# Patient Record
Sex: Female | Born: 1940 | Race: Black or African American | Hispanic: No | Marital: Single | Smoking: Former smoker
Health system: Southern US, Community
[De-identification: ages and names within clinical notes are randomized; demographics above are authoritative.]

## PROBLEM LIST (undated history)

## (undated) DIAGNOSIS — I10 Essential (primary) hypertension: Secondary | ICD-10-CM

## (undated) DIAGNOSIS — M1990 Unspecified osteoarthritis, unspecified site: Secondary | ICD-10-CM

## (undated) DIAGNOSIS — E119 Type 2 diabetes mellitus without complications: Secondary | ICD-10-CM

## (undated) DIAGNOSIS — I639 Cerebral infarction, unspecified: Secondary | ICD-10-CM

## (undated) DIAGNOSIS — F0390 Unspecified dementia without behavioral disturbance: Secondary | ICD-10-CM

## (undated) HISTORY — PX: ABDOMINAL SURGERY: SHX537

## (undated) HISTORY — PX: ABDOMINAL HYSTERECTOMY: SHX81

## (undated) HISTORY — DX: Unspecified osteoarthritis, unspecified site: M19.90

---

## 2003-12-24 ENCOUNTER — Other Ambulatory Visit: Payer: Self-pay

## 2004-01-07 ENCOUNTER — Other Ambulatory Visit: Payer: Self-pay

## 2004-01-08 ENCOUNTER — Observation Stay: Payer: Self-pay

## 2004-04-01 ENCOUNTER — Other Ambulatory Visit: Payer: Self-pay

## 2004-04-01 ENCOUNTER — Emergency Department: Payer: Self-pay | Admitting: General Practice

## 2004-06-20 ENCOUNTER — Emergency Department: Payer: Self-pay | Admitting: Unknown Physician Specialty

## 2005-08-25 ENCOUNTER — Emergency Department: Payer: Self-pay | Admitting: Emergency Medicine

## 2006-03-22 ENCOUNTER — Other Ambulatory Visit: Payer: Self-pay

## 2006-03-22 ENCOUNTER — Inpatient Hospital Stay: Payer: Self-pay | Admitting: Internal Medicine

## 2006-04-25 ENCOUNTER — Emergency Department: Payer: Self-pay | Admitting: Unknown Physician Specialty

## 2006-06-22 ENCOUNTER — Ambulatory Visit: Payer: Self-pay | Admitting: Otolaryngology

## 2006-06-30 ENCOUNTER — Observation Stay: Payer: Self-pay | Admitting: Otolaryngology

## 2006-10-06 ENCOUNTER — Emergency Department: Payer: Self-pay | Admitting: Emergency Medicine

## 2006-11-09 ENCOUNTER — Emergency Department: Payer: Self-pay | Admitting: Emergency Medicine

## 2006-11-10 ENCOUNTER — Ambulatory Visit: Payer: Self-pay | Admitting: Otolaryngology

## 2007-02-27 ENCOUNTER — Emergency Department: Payer: Self-pay | Admitting: Unknown Physician Specialty

## 2007-04-12 ENCOUNTER — Emergency Department: Payer: Self-pay | Admitting: Emergency Medicine

## 2007-06-30 ENCOUNTER — Ambulatory Visit: Payer: Self-pay | Admitting: Gastroenterology

## 2007-07-15 ENCOUNTER — Ambulatory Visit: Payer: Self-pay | Admitting: Family Medicine

## 2007-08-06 ENCOUNTER — Ambulatory Visit: Payer: Self-pay | Admitting: Family Medicine

## 2008-01-21 ENCOUNTER — Emergency Department: Payer: Self-pay | Admitting: Emergency Medicine

## 2008-02-09 ENCOUNTER — Inpatient Hospital Stay: Payer: Self-pay | Admitting: Internal Medicine

## 2008-04-08 ENCOUNTER — Emergency Department: Payer: Self-pay | Admitting: Emergency Medicine

## 2008-05-04 ENCOUNTER — Emergency Department: Payer: Self-pay

## 2008-05-17 ENCOUNTER — Inpatient Hospital Stay: Payer: Self-pay | Admitting: Internal Medicine

## 2008-07-16 ENCOUNTER — Emergency Department: Payer: Self-pay | Admitting: Emergency Medicine

## 2008-10-05 ENCOUNTER — Emergency Department: Payer: Self-pay | Admitting: Emergency Medicine

## 2008-11-09 ENCOUNTER — Ambulatory Visit: Payer: Self-pay | Admitting: Family Medicine

## 2009-02-28 ENCOUNTER — Ambulatory Visit: Payer: Self-pay | Admitting: Family Medicine

## 2009-05-21 ENCOUNTER — Ambulatory Visit: Payer: Self-pay | Admitting: Gastroenterology

## 2009-06-14 ENCOUNTER — Ambulatory Visit: Payer: Self-pay | Admitting: Gastroenterology

## 2009-06-26 ENCOUNTER — Ambulatory Visit: Payer: Self-pay | Admitting: Gastroenterology

## 2009-07-16 ENCOUNTER — Ambulatory Visit: Payer: Self-pay | Admitting: Surgery

## 2009-07-18 ENCOUNTER — Ambulatory Visit: Payer: Self-pay | Admitting: Cardiology

## 2009-07-19 ENCOUNTER — Ambulatory Visit: Payer: Self-pay | Admitting: Surgery

## 2009-11-01 ENCOUNTER — Ambulatory Visit: Payer: Self-pay | Admitting: Internal Medicine

## 2010-06-24 ENCOUNTER — Emergency Department (HOSPITAL_COMMUNITY)
Admission: EM | Admit: 2010-06-24 | Discharge: 2010-06-24 | Disposition: A | Discharge status: Home / Self Care | Payer: Medicare Other | Attending: Emergency Medicine | Admitting: Emergency Medicine

## 2010-06-24 DIAGNOSIS — K648 Other hemorrhoids: Secondary | ICD-10-CM | POA: Insufficient documentation

## 2010-06-24 DIAGNOSIS — E119 Type 2 diabetes mellitus without complications: Secondary | ICD-10-CM | POA: Insufficient documentation

## 2010-06-24 DIAGNOSIS — I10 Essential (primary) hypertension: Secondary | ICD-10-CM | POA: Insufficient documentation

## 2010-06-24 DIAGNOSIS — B373 Candidiasis of vulva and vagina: Secondary | ICD-10-CM | POA: Insufficient documentation

## 2010-06-24 DIAGNOSIS — L293 Anogenital pruritus, unspecified: Secondary | ICD-10-CM | POA: Insufficient documentation

## 2010-06-24 DIAGNOSIS — B3731 Acute candidiasis of vulva and vagina: Secondary | ICD-10-CM | POA: Insufficient documentation

## 2010-06-24 DIAGNOSIS — R195 Other fecal abnormalities: Secondary | ICD-10-CM | POA: Insufficient documentation

## 2010-06-24 DIAGNOSIS — K6289 Other specified diseases of anus and rectum: Secondary | ICD-10-CM | POA: Insufficient documentation

## 2010-06-24 DIAGNOSIS — E669 Obesity, unspecified: Secondary | ICD-10-CM | POA: Insufficient documentation

## 2010-06-24 LAB — CBC
MCH: 29.8 pg (ref 26.0–34.0)
MCHC: 35.4 g/dL (ref 30.0–36.0)
MCV: 84.2 fL (ref 78.0–100.0)
Platelets: 204 10*3/uL (ref 150–400)
RDW: 13.8 % (ref 11.5–15.5)

## 2010-06-24 LAB — URINALYSIS, ROUTINE W REFLEX MICROSCOPIC
Bilirubin Urine: NEGATIVE
Ketones, ur: 15 mg/dL — AB
Nitrite: NEGATIVE
Protein, ur: NEGATIVE mg/dL
Urobilinogen, UA: 1 mg/dL (ref 0.0–1.0)
pH: 7 (ref 5.0–8.0)

## 2010-06-24 LAB — URINE MICROSCOPIC-ADD ON

## 2010-06-24 LAB — DIFFERENTIAL
Eosinophils Absolute: 0.1 10*3/uL (ref 0.0–0.7)
Eosinophils Relative: 1 % (ref 0–5)
Lymphs Abs: 2.7 10*3/uL (ref 0.7–4.0)
Monocytes Absolute: 0.4 10*3/uL (ref 0.1–1.0)
Monocytes Relative: 6 % (ref 3–12)

## 2010-06-24 LAB — COMPREHENSIVE METABOLIC PANEL
ALT: 18 U/L (ref 0–35)
Calcium: 9 mg/dL (ref 8.4–10.5)
Glucose, Bld: 259 mg/dL — ABNORMAL HIGH (ref 70–99)
Sodium: 136 mEq/L (ref 135–145)
Total Protein: 7.5 g/dL (ref 6.0–8.3)

## 2010-06-24 LAB — WET PREP, GENITAL
Clue Cells Wet Prep HPF POC: NONE SEEN
Trich, Wet Prep: NONE SEEN

## 2010-06-25 LAB — GC/CHLAMYDIA PROBE AMP, GENITAL
Chlamydia, DNA Probe: NEGATIVE
GC Probe Amp, Genital: NEGATIVE

## 2010-07-11 ENCOUNTER — Ambulatory Visit: Payer: PRIVATE HEALTH INSURANCE | Admitting: Internal Medicine

## 2010-07-15 ENCOUNTER — Emergency Department (HOSPITAL_COMMUNITY): Payer: Medicare Other

## 2010-07-15 ENCOUNTER — Emergency Department (HOSPITAL_COMMUNITY)
Admission: EM | Admit: 2010-07-15 | Discharge: 2010-07-15 | Disposition: A | Discharge status: Home / Self Care | Payer: Medicare Other | Attending: Emergency Medicine | Admitting: Emergency Medicine

## 2010-07-15 DIAGNOSIS — R300 Dysuria: Secondary | ICD-10-CM | POA: Insufficient documentation

## 2010-07-15 DIAGNOSIS — R350 Frequency of micturition: Secondary | ICD-10-CM | POA: Insufficient documentation

## 2010-07-15 DIAGNOSIS — R141 Gas pain: Secondary | ICD-10-CM | POA: Insufficient documentation

## 2010-07-15 DIAGNOSIS — R319 Hematuria, unspecified: Secondary | ICD-10-CM | POA: Insufficient documentation

## 2010-07-15 DIAGNOSIS — R109 Unspecified abdominal pain: Secondary | ICD-10-CM | POA: Insufficient documentation

## 2010-07-15 DIAGNOSIS — R142 Eructation: Secondary | ICD-10-CM | POA: Insufficient documentation

## 2010-07-15 DIAGNOSIS — E119 Type 2 diabetes mellitus without complications: Secondary | ICD-10-CM | POA: Insufficient documentation

## 2010-07-15 DIAGNOSIS — N390 Urinary tract infection, site not specified: Secondary | ICD-10-CM | POA: Insufficient documentation

## 2010-07-15 DIAGNOSIS — I10 Essential (primary) hypertension: Secondary | ICD-10-CM | POA: Insufficient documentation

## 2010-07-15 LAB — DIFFERENTIAL
Basophils Absolute: 0 10*3/uL (ref 0.0–0.1)
Lymphocytes Relative: 26 % (ref 12–46)
Monocytes Absolute: 0.6 10*3/uL (ref 0.1–1.0)
Neutro Abs: 7.2 10*3/uL (ref 1.7–7.7)

## 2010-07-15 LAB — COMPREHENSIVE METABOLIC PANEL
ALT: 18 U/L (ref 0–35)
Alkaline Phosphatase: 82 U/L (ref 39–117)
CO2: 22 mEq/L (ref 19–32)
GFR calc non Af Amer: >60 mL/min (ref >60)
Glucose, Bld: 270 mg/dL — ABNORMAL HIGH (ref 70–99)
Potassium: 3.8 mEq/L (ref 3.5–5.1)
Sodium: 134 mEq/L — ABNORMAL LOW (ref 135–145)

## 2010-07-15 LAB — URINALYSIS, ROUTINE W REFLEX MICROSCOPIC
Bilirubin Urine: NEGATIVE
Glucose, UA: >1000 mg/dL — AB
Ketones, ur: 40 mg/dL — AB
Specific Gravity, Urine: 1.023 (ref 1.005–1.030)
pH: 7 (ref 5.0–8.0)

## 2010-07-15 LAB — CBC
HCT: 44.5 % (ref 36.0–46.0)
Hemoglobin: 15.8 g/dL — ABNORMAL HIGH (ref 12.0–15.0)
RBC: 5.22 MIL/uL — ABNORMAL HIGH (ref 3.87–5.11)
WBC: 10.8 10*3/uL — ABNORMAL HIGH (ref 4.0–10.5)

## 2010-07-15 LAB — URINE MICROSCOPIC-ADD ON

## 2010-07-15 LAB — WET PREP, GENITAL: Yeast Wet Prep HPF POC: NONE SEEN

## 2010-07-15 MED ORDER — IOHEXOL 300 MG/ML  SOLN
100.0000 mL | Freq: Once | INTRAMUSCULAR | Status: AC | PRN
  Administered 2010-07-15: 100 mL via INTRAVENOUS

## 2010-08-05 ENCOUNTER — Ambulatory Visit: Payer: PRIVATE HEALTH INSURANCE | Admitting: Internal Medicine

## 2010-09-21 ENCOUNTER — Emergency Department: Payer: Self-pay | Admitting: Emergency Medicine

## 2011-06-09 DIAGNOSIS — H814 Vertigo of central origin: Secondary | ICD-10-CM | POA: Diagnosis not present

## 2011-06-09 DIAGNOSIS — H60339 Swimmer's ear, unspecified ear: Secondary | ICD-10-CM | POA: Diagnosis not present

## 2011-09-05 DIAGNOSIS — E559 Vitamin D deficiency, unspecified: Secondary | ICD-10-CM | POA: Diagnosis not present

## 2011-09-05 DIAGNOSIS — I10 Essential (primary) hypertension: Secondary | ICD-10-CM | POA: Diagnosis not present

## 2011-09-05 DIAGNOSIS — E119 Type 2 diabetes mellitus without complications: Secondary | ICD-10-CM | POA: Diagnosis not present

## 2012-01-19 DIAGNOSIS — I10 Essential (primary) hypertension: Secondary | ICD-10-CM | POA: Diagnosis not present

## 2012-01-19 DIAGNOSIS — E782 Mixed hyperlipidemia: Secondary | ICD-10-CM | POA: Diagnosis not present

## 2012-01-19 DIAGNOSIS — E119 Type 2 diabetes mellitus without complications: Secondary | ICD-10-CM | POA: Diagnosis not present

## 2012-09-05 ENCOUNTER — Encounter (HOSPITAL_COMMUNITY): Payer: Self-pay | Admitting: Emergency Medicine

## 2012-09-05 ENCOUNTER — Emergency Department (HOSPITAL_COMMUNITY)
Admission: EM | Admit: 2012-09-05 | Discharge: 2012-09-05 | Disposition: A | Discharge status: Home / Self Care | Payer: Medicare Other | Attending: Emergency Medicine | Admitting: Emergency Medicine

## 2012-09-05 DIAGNOSIS — Z794 Long term (current) use of insulin: Secondary | ICD-10-CM | POA: Insufficient documentation

## 2012-09-05 DIAGNOSIS — E119 Type 2 diabetes mellitus without complications: Secondary | ICD-10-CM | POA: Insufficient documentation

## 2012-09-05 DIAGNOSIS — K921 Melena: Secondary | ICD-10-CM | POA: Insufficient documentation

## 2012-09-05 DIAGNOSIS — K625 Hemorrhage of anus and rectum: Secondary | ICD-10-CM

## 2012-09-05 DIAGNOSIS — Z79899 Other long term (current) drug therapy: Secondary | ICD-10-CM | POA: Insufficient documentation

## 2012-09-05 DIAGNOSIS — R110 Nausea: Secondary | ICD-10-CM | POA: Insufficient documentation

## 2012-09-05 DIAGNOSIS — I10 Essential (primary) hypertension: Secondary | ICD-10-CM | POA: Insufficient documentation

## 2012-09-05 HISTORY — DX: Type 2 diabetes mellitus without complications: E11.9

## 2012-09-05 HISTORY — DX: Essential (primary) hypertension: I10

## 2012-09-05 LAB — PROTIME-INR: INR: 0.96 (ref 0.00–1.49)

## 2012-09-05 LAB — BASIC METABOLIC PANEL
CO2: 29 mEq/L (ref 19–32)
Calcium: 9.2 mg/dL (ref 8.4–10.5)
Creatinine, Ser: 0.64 mg/dL (ref 0.50–1.10)
GFR calc Af Amer: >90 mL/min (ref >90)
GFR calc non Af Amer: 87 mL/min — ABNORMAL LOW (ref >90)
Sodium: 141 mEq/L (ref 135–145)

## 2012-09-05 LAB — TYPE AND SCREEN
ABO/RH(D): A NEG
Antibody Screen: NEGATIVE

## 2012-09-05 LAB — CBC
MCH: 30.2 pg (ref 26.0–34.0)
Platelets: 180 10*3/uL (ref 150–400)
RBC: 4.61 MIL/uL (ref 3.87–5.11)
RDW: 14.1 % (ref 11.5–15.5)

## 2012-09-05 MED ORDER — HYDROCODONE-ACETAMINOPHEN 5-325 MG PO TABS
1.0000 | ORAL_TABLET | ORAL | Status: DC | PRN
Start: 2012-09-05 — End: 2012-10-25

## 2012-09-05 MED ORDER — ONDANSETRON 8 MG PO TBDP: 8.0000 mg | ORAL_TABLET | Freq: Three times a day (TID) | ORAL | Status: DC | PRN

## 2012-09-05 MED ORDER — POTASSIUM CHLORIDE CRYS ER 20 MEQ PO TBCR
40.0000 meq | EXTENDED_RELEASE_TABLET | Freq: Once | ORAL | Status: AC
  Administered 2012-09-05: 40 meq via ORAL
  Filled 2012-09-05: qty 2

## 2012-09-05 NOTE — ED Provider Notes (Signed)
History     CSN: [id number]  Arrival date & time 09/05/12  0911   First MD Initiated Contact with Patient 09/05/12 [phone]      Chief Complaint  Patient presents with  . Rectal Bleeding     The history is provided by the patient.   patient reports a history of intermittent abdominal pain.  She reports she has a history of irritable bowel syndrome.  She reports her last 5 days she's had intermittent crampy upper abdominal pain with associated nausea.  She denies vomiting.  She denies diarrhea or constipation.  She reports she's feels like she seen some blood mixed in with her stool.  She's only had 1 bowel movement a day.  She denies rectal bleeding between bowel movements.  Her symptoms are mild to moderate in severity.  Nothing worsens or improves her symptoms.  She has been able to keep herself somewhat hydrated at home.  No fevers or chills.  She states regarding her intermittent abdominal pain her gastroenterologist believes it's irritable bowel syndrome but has no other diagnosis.  Past Medical History  Diagnosis Date  . Hypertension   . Diabetes mellitus without complication     History reviewed. No pertinent past surgical history.  No family history on file.  History  Substance Use Topics  . Smoking status: Not on file  . Smokeless tobacco: Not on file  . Alcohol Use: No    OB History   Grav Para Term Preterm Abortions TAB SAB Ect Mult Living                  Review of Systems  Gastrointestinal: Positive for hematochezia.  All other systems reviewed and are negative.    Allergies  Aspirin and Motrin  Home Medications   Current Outpatient Rx  Name  Route  Sig  Dispense  Refill  . atenolol (TENORMIN) 100 MG tablet   Oral   Take 100 mg by mouth daily.         . diphenhydramine-acetaminophen (TYLENOL PM) 25-500 MG TABS   Oral   Take 2 tablets by mouth at bedtime as needed (sleep/pain).         . insulin glargine (LANTUS) 100 UNIT/ML injection  Subcutaneous   Inject 30 Units into the skin daily.           BP 171/87  Pulse 79  Temp(Src) 98.5 F (36.9 C) (Oral)  Resp 18  SpO2 99%  Physical Exam  Nursing note and vitals reviewed. Constitutional: She is oriented to person, place, and time. She appears well-developed and well-nourished. No distress.  HENT:  Head: Normocephalic and atraumatic.  Eyes: EOM are normal.  Neck: Normal range of motion.  Cardiovascular: Normal rate, regular rhythm and normal heart sounds.   Pulmonary/Chest: Effort normal and breath sounds normal.  Abdominal: Soft. She exhibits no distension. There is no tenderness.  Genitourinary:  No bleeding external hemorrhoids noted.  No obvious external hemorrhoids.  No tenderness on rectal exam.  Stool is brown.  No gross blood.  Musculoskeletal: Normal range of motion.  Neurological: She is alert and oriented to person, place, and time.  Skin: Skin is warm and dry.  Psychiatric: She has a normal mood and affect. Judgment normal.    ED Course  Procedures (including critical care time)  Labs Reviewed  BASIC METABOLIC PANEL - Abnormal; Notable for the following:    Potassium 2.7 (*)    Glucose, Bld 174 (*)    GFR calc non  Af Amer 87 (*)    All other components within normal limits  CBC  PROTIME-INR  TYPE AND SCREEN  ABO/RH   No results found.   1. Nausea   2. Rectal bleeding       MDM  Discharge the patient feels much better.  Hemoccult blood test is negative at the bedside.  Vitals and hemoglobin normal.  GI followup.  Repeat abdominal exam benign.  Potassium repleted in emergency apartment.  Discharge home in good condition         Lyanne Co, MD 09/05/12 [phone]

## 2012-09-05 NOTE — ED Notes (Signed)
Pt. Stated, I 've been seeing blood in my stool for about a week, I've also had some abd. Cramp.

## 2012-09-05 NOTE — ED Notes (Signed)
Dr. Campos at the bedside.  

## 2012-09-07 ENCOUNTER — Telehealth (HOSPITAL_COMMUNITY): Payer: Self-pay | Admitting: *Deleted

## 2012-10-25 ENCOUNTER — Emergency Department (HOSPITAL_COMMUNITY)
Admission: EM | Admit: 2012-10-25 | Discharge: 2012-10-25 | Disposition: A | Discharge status: Home / Self Care | Payer: Medicare Other | Attending: Emergency Medicine | Admitting: Emergency Medicine

## 2012-10-25 ENCOUNTER — Encounter (HOSPITAL_COMMUNITY): Payer: Self-pay | Admitting: *Deleted

## 2012-10-25 DIAGNOSIS — H6001 Abscess of right external ear: Secondary | ICD-10-CM

## 2012-10-25 DIAGNOSIS — Z794 Long term (current) use of insulin: Secondary | ICD-10-CM | POA: Insufficient documentation

## 2012-10-25 DIAGNOSIS — Z888 Allergy status to other drugs, medicaments and biological substances status: Secondary | ICD-10-CM | POA: Insufficient documentation

## 2012-10-25 DIAGNOSIS — Z880 Allergy status to penicillin: Secondary | ICD-10-CM | POA: Insufficient documentation

## 2012-10-25 DIAGNOSIS — E1159 Type 2 diabetes mellitus with other circulatory complications: Secondary | ICD-10-CM | POA: Insufficient documentation

## 2012-10-25 DIAGNOSIS — H60399 Other infective otitis externa, unspecified ear: Secondary | ICD-10-CM | POA: Insufficient documentation

## 2012-10-25 DIAGNOSIS — Z79899 Other long term (current) drug therapy: Secondary | ICD-10-CM | POA: Insufficient documentation

## 2012-10-25 DIAGNOSIS — I10 Essential (primary) hypertension: Secondary | ICD-10-CM | POA: Insufficient documentation

## 2012-10-25 MED ORDER — ACETAMINOPHEN-CODEINE #3 300-30 MG PO TABS: 1.0000 | ORAL_TABLET | Freq: Four times a day (QID) | ORAL | Status: DC | PRN

## 2012-10-25 MED ORDER — ACETAMINOPHEN-CODEINE #4 300-60 MG PO TABS: 1.0000 | ORAL_TABLET | ORAL | Status: DC | PRN

## 2012-10-25 MED ORDER — ACETAMINOPHEN-CODEINE #3 300-30 MG PO TABS
2.0000 | ORAL_TABLET | Freq: Once | ORAL | Status: AC
  Administered 2012-10-25: 2 via ORAL
  Filled 2012-10-25: qty 2

## 2012-10-25 MED ORDER — SULFAMETHOXAZOLE-TRIMETHOPRIM 800-160 MG PO TABS: 2.0000 | ORAL_TABLET | Freq: Two times a day (BID) | ORAL | Status: DC

## 2012-10-25 NOTE — ED Provider Notes (Signed)
Medical screening examination/treatment/procedure(s) were conducted as a shared visit with non-physician practitioner(s) and myself.  I personally evaluated the patient during the encounter.  The patient has a history of DM and presents with a painful knot to the right side of the head near the ear.  She denies any injury or trauma.  No fevers or chills.  On exam, the vitals are stable and the patient is afebrile.  There is a swollen area to the right upper ear / side of the head.  It is fluctuant and tender to the touch.    This is an abscess that will be I and Ded by E. Chad (see her note).  Will treat with antibiotics, warm soaks, and prn follow up.  Geoffery Lyons, MD 10/25/12 [phone]

## 2012-10-25 NOTE — ED Notes (Signed)
Pt reports going to her dentist on the 18th and having a "knot" appear on her ear after that. Pt noted to have reddness and swelling to rt upper ear. Pt states that pain goes into her rt temple as well.

## 2012-10-25 NOTE — ED Provider Notes (Signed)
History    CSN: [id number] Arrival date & time 10/25/12  1132  First MD Initiated Contact with Patient 10/25/12 1144     Chief Complaint  Patient presents with  . Otalgia   (Consider location/radiation/quality/duration/timing/severity/associated sxs/prior Treatment) HPI Comments: Patient reports she has had several days of a painful knot on the outside of her right ear.  Pain is constant, occasionally radiates into right temple, described as throbbing and stabbing.  Denies fever, chills, body ache, injury to the ear, change in hearing, pain inside the ear, any discharge from the area, any dizziness.    The history is provided by the patient.   Past Medical History  Diagnosis Date  . Hypertension   . Diabetes mellitus without complication    History reviewed. No pertinent past surgical history. No family history on file. History  Substance Use Topics  . Smoking status: Not on file  . Smokeless tobacco: Not on file  . Alcohol Use: No   OB History   Grav Para Term Preterm Abortions TAB SAB Ect Mult Living                 Review of Systems  Constitutional: Negative for fever and chills.  HENT: Positive for ear pain. Negative for hearing loss, tinnitus and ear discharge.   Musculoskeletal: Negative for myalgias.  Neurological: Negative for dizziness and light-headedness.    Allergies  Aspirin; Motrin; and Penicillins  Home Medications   Current Outpatient Rx  Name  Route  Sig  Dispense  Refill  . atenolol (TENORMIN) 100 MG tablet   Oral   Take 100 mg by mouth daily.         . diphenhydramine-acetaminophen (TYLENOL PM) 25-500 MG TABS   Oral   Take 2 tablets by mouth at bedtime as needed (sleep/pain).         . insulin glargine (LANTUS) 100 UNIT/ML injection   Subcutaneous   Inject 30 Units into the skin daily. Morning or afternoon.          BP 172/88  Pulse 68  Temp(Src) 98.6 F (37 C) (Oral)  Resp 20  SpO2 97% Physical Exam  Nursing note and  vitals reviewed. Constitutional: She appears well-developed and well-nourished. No distress.  HENT:  Head: Normocephalic and atraumatic.  Right Ear: Tympanic membrane and ear canal normal.  Ears:  Neck: Neck supple.  Pulmonary/Chest: Effort normal.  Neurological: She is alert.  Skin: She is not diaphoretic.    ED Course  Procedures (including critical care time) Labs Reviewed - No data to display No results found.  Pt discussed with and also seen by Dr Judd Lien.   INCISION AND DRAINAGE Performed by: Trixie Dredge B Consent: Verbal consent obtained. Risks and benefits: risks, benefits and alternatives were discussed Type: abscess  Body area: right ear  Anesthesia: local infiltration  Incision was made with a scalpel.  Local anesthetic: lidocaine 2% no epinephrine  Anesthetic total: 1 ml  Complexity: simple   Drainage: purulent  Drainage amount: moderate   Packing material: none  Patient tolerance: Patient tolerated the procedure well with no immediate complications.    1. Abscess of right pinna     MDM  Afebrile nontoxic patient with small abscess of right pinna, I&D performed here with great relief.  Pt placed on bactrim as she is diabetic, as suggested by Dr Judd Lien.  Pt d/c home with bactrim and tylenol #3 (her request). Discussed findings, treatment, follow up with patient.  Pt given return precautions.  Pt verbalizes understanding and agrees with plan.     Trixie Dredge, PA-C 10/25/12 1532

## 2012-12-27 ENCOUNTER — Emergency Department: Payer: Self-pay | Admitting: Emergency Medicine

## 2012-12-27 LAB — URINALYSIS, COMPLETE
Nitrite: NEGATIVE
Ph: 7 (ref 4.5–8.0)
RBC,UR: 5 /HPF (ref 0–5)
Specific Gravity: 1.026 (ref 1.003–1.030)

## 2012-12-27 LAB — COMPREHENSIVE METABOLIC PANEL
Albumin: 4.5 g/dL (ref 3.4–5.0)
Alkaline Phosphatase: 94 U/L (ref 50–136)
Bilirubin,Total: 0.8 mg/dL (ref 0.2–1.0)
Chloride: 102 mmol/L (ref 98–107)
EGFR (Non-African Amer.): >60
Glucose: 238 mg/dL — ABNORMAL HIGH (ref 65–99)
Osmolality: 282 (ref 275–301)
Potassium: 3.2 mmol/L — ABNORMAL LOW (ref 3.5–5.1)
SGOT(AST): 28 U/L (ref 15–37)
SGPT (ALT): 23 U/L (ref 12–78)
Total Protein: 8.6 g/dL — ABNORMAL HIGH (ref 6.4–8.2)

## 2012-12-27 LAB — CBC
MCHC: 33.3 g/dL (ref 32.0–36.0)
RBC: 5.37 10*6/uL — ABNORMAL HIGH (ref 3.80–5.20)
WBC: 11.3 10*3/uL — ABNORMAL HIGH (ref 3.6–11.0)

## 2012-12-27 LAB — LIPASE, BLOOD: Lipase: 50 U/L — ABNORMAL LOW (ref 73–393)

## 2013-05-13 DIAGNOSIS — I10 Essential (primary) hypertension: Secondary | ICD-10-CM | POA: Diagnosis not present

## 2013-05-13 DIAGNOSIS — Z0000 Encounter for general adult medical examination without abnormal findings: Secondary | ICD-10-CM | POA: Diagnosis not present

## 2013-05-13 DIAGNOSIS — E119 Type 2 diabetes mellitus without complications: Secondary | ICD-10-CM | POA: Diagnosis not present

## 2013-05-22 ENCOUNTER — Emergency Department: Payer: Self-pay | Admitting: Emergency Medicine

## 2013-05-22 DIAGNOSIS — E119 Type 2 diabetes mellitus without complications: Secondary | ICD-10-CM | POA: Diagnosis not present

## 2013-05-22 DIAGNOSIS — M1990 Unspecified osteoarthritis, unspecified site: Secondary | ICD-10-CM | POA: Diagnosis not present

## 2013-05-22 DIAGNOSIS — R109 Unspecified abdominal pain: Secondary | ICD-10-CM | POA: Diagnosis not present

## 2013-05-22 DIAGNOSIS — Z79899 Other long term (current) drug therapy: Secondary | ICD-10-CM | POA: Diagnosis not present

## 2013-05-22 DIAGNOSIS — K6389 Other specified diseases of intestine: Secondary | ICD-10-CM | POA: Diagnosis not present

## 2013-05-22 DIAGNOSIS — K7689 Other specified diseases of liver: Secondary | ICD-10-CM | POA: Diagnosis not present

## 2013-05-22 DIAGNOSIS — K639 Disease of intestine, unspecified: Secondary | ICD-10-CM | POA: Diagnosis not present

## 2013-05-22 DIAGNOSIS — R1031 Right lower quadrant pain: Secondary | ICD-10-CM | POA: Diagnosis not present

## 2013-05-22 DIAGNOSIS — I10 Essential (primary) hypertension: Secondary | ICD-10-CM | POA: Diagnosis not present

## 2013-05-22 LAB — CBC WITH DIFFERENTIAL/PLATELET
BASOS PCT: 0.7 %
Basophil #: 0.1 10*3/uL (ref 0.0–0.1)
Eosinophil #: 0 10*3/uL (ref 0.0–0.7)
Eosinophil %: 0.1 %
HCT: 45.9 % (ref 35.0–47.0)
HGB: 14.8 g/dL (ref 12.0–16.0)
LYMPHS PCT: 34.4 %
Lymphocyte #: 3 10*3/uL (ref 1.0–3.6)
MCH: 28.9 pg (ref 26.0–34.0)
MCHC: 32.3 g/dL (ref 32.0–36.0)
MCV: 90 fL (ref 80–100)
MONO ABS: 0.8 x10 3/mm (ref 0.2–0.9)
Monocyte %: 9.8 %
Neutrophil #: 4.7 10*3/uL (ref 1.4–6.5)
Neutrophil %: 55 %
Platelet: 205 10*3/uL (ref 150–440)
RBC: 5.12 10*6/uL (ref 3.80–5.20)
RDW: 14.6 % — AB (ref 11.5–14.5)
WBC: 8.6 10*3/uL (ref 3.6–11.0)

## 2013-05-22 LAB — COMPREHENSIVE METABOLIC PANEL
ALBUMIN: 3.8 g/dL (ref 3.4–5.0)
ALK PHOS: 73 U/L
ALT: 25 U/L (ref 12–78)
AST: 27 U/L (ref 15–37)
Anion Gap: 5 — ABNORMAL LOW (ref 7–16)
BILIRUBIN TOTAL: 0.7 mg/dL (ref 0.2–1.0)
BUN: 10 mg/dL (ref 7–18)
CREATININE: 0.91 mg/dL (ref 0.60–1.30)
Calcium, Total: 9.3 mg/dL (ref 8.5–10.1)
Chloride: 102 mmol/L (ref 98–107)
Co2: 30 mmol/L (ref 21–32)
EGFR (African American): >60
Glucose: 153 mg/dL — ABNORMAL HIGH (ref 65–99)
Osmolality: 276 (ref 275–301)
Potassium: 3.6 mmol/L (ref 3.5–5.1)
Sodium: 137 mmol/L (ref 136–145)
TOTAL PROTEIN: 8.1 g/dL (ref 6.4–8.2)

## 2013-05-22 LAB — URINALYSIS, COMPLETE
BILIRUBIN, UR: NEGATIVE
BLOOD: NEGATIVE
Bacteria: NONE SEEN
GLUCOSE, UR: NEGATIVE mg/dL (ref 0–75)
KETONE: NEGATIVE
Leukocyte Esterase: NEGATIVE
NITRITE: NEGATIVE
PH: 7 (ref 4.5–8.0)
PROTEIN: NEGATIVE
RBC,UR: <1 /HPF (ref 0–5)
Specific Gravity: 1.025 (ref 1.003–1.030)
Squamous Epithelial: <1
WBC UR: <1 /HPF (ref 0–5)

## 2013-05-22 LAB — LIPASE, BLOOD: LIPASE: 84 U/L (ref 73–393)

## 2013-05-23 DIAGNOSIS — R1031 Right lower quadrant pain: Secondary | ICD-10-CM | POA: Diagnosis not present

## 2013-06-09 DIAGNOSIS — R933 Abnormal findings on diagnostic imaging of other parts of digestive tract: Secondary | ICD-10-CM | POA: Diagnosis not present

## 2013-07-24 ENCOUNTER — Inpatient Hospital Stay: Payer: Self-pay | Admitting: Internal Medicine

## 2013-07-24 DIAGNOSIS — I10 Essential (primary) hypertension: Secondary | ICD-10-CM | POA: Diagnosis not present

## 2013-07-24 DIAGNOSIS — K5669 Other intestinal obstruction: Secondary | ICD-10-CM | POA: Diagnosis not present

## 2013-07-24 DIAGNOSIS — Z794 Long term (current) use of insulin: Secondary | ICD-10-CM | POA: Diagnosis not present

## 2013-07-24 DIAGNOSIS — R935 Abnormal findings on diagnostic imaging of other abdominal regions, including retroperitoneum: Secondary | ICD-10-CM | POA: Diagnosis not present

## 2013-07-24 DIAGNOSIS — K5730 Diverticulosis of large intestine without perforation or abscess without bleeding: Secondary | ICD-10-CM | POA: Diagnosis not present

## 2013-07-24 DIAGNOSIS — K56609 Unspecified intestinal obstruction, unspecified as to partial versus complete obstruction: Secondary | ICD-10-CM | POA: Diagnosis not present

## 2013-07-24 DIAGNOSIS — Z8673 Personal history of transient ischemic attack (TIA), and cerebral infarction without residual deficits: Secondary | ICD-10-CM | POA: Diagnosis not present

## 2013-07-24 DIAGNOSIS — K7689 Other specified diseases of liver: Secondary | ICD-10-CM | POA: Diagnosis not present

## 2013-07-24 DIAGNOSIS — E119 Type 2 diabetes mellitus without complications: Secondary | ICD-10-CM | POA: Diagnosis not present

## 2013-07-24 DIAGNOSIS — E876 Hypokalemia: Secondary | ICD-10-CM | POA: Diagnosis not present

## 2013-07-24 DIAGNOSIS — R599 Enlarged lymph nodes, unspecified: Secondary | ICD-10-CM | POA: Diagnosis not present

## 2013-07-24 DIAGNOSIS — E669 Obesity, unspecified: Secondary | ICD-10-CM | POA: Diagnosis present

## 2013-07-24 DIAGNOSIS — Z6830 Body mass index (BMI) 30.0-30.9, adult: Secondary | ICD-10-CM | POA: Diagnosis not present

## 2013-07-24 DIAGNOSIS — R1909 Other intra-abdominal and pelvic swelling, mass and lump: Secondary | ICD-10-CM | POA: Diagnosis not present

## 2013-07-24 DIAGNOSIS — H9190 Unspecified hearing loss, unspecified ear: Secondary | ICD-10-CM | POA: Diagnosis present

## 2013-07-24 DIAGNOSIS — E109 Type 1 diabetes mellitus without complications: Secondary | ICD-10-CM | POA: Diagnosis present

## 2013-07-24 DIAGNOSIS — F0391 Unspecified dementia with behavioral disturbance: Secondary | ICD-10-CM | POA: Diagnosis not present

## 2013-07-24 DIAGNOSIS — R109 Unspecified abdominal pain: Secondary | ICD-10-CM | POA: Diagnosis not present

## 2013-07-24 DIAGNOSIS — F03918 Unspecified dementia, unspecified severity, with other behavioral disturbance: Secondary | ICD-10-CM | POA: Diagnosis present

## 2013-07-24 DIAGNOSIS — C189 Malignant neoplasm of colon, unspecified: Secondary | ICD-10-CM | POA: Diagnosis not present

## 2013-07-24 DIAGNOSIS — M1990 Unspecified osteoarthritis, unspecified site: Secondary | ICD-10-CM | POA: Diagnosis present

## 2013-07-24 LAB — CBC WITH DIFFERENTIAL/PLATELET
Basophil #: 0.1 10*3/uL (ref 0.0–0.1)
Basophil %: 0.7 %
Eosinophil #: 0.1 10*3/uL (ref 0.0–0.7)
Eosinophil %: 1.1 %
HCT: 43 % (ref 35.0–47.0)
HGB: 13.9 g/dL (ref 12.0–16.0)
LYMPHS PCT: 40.7 %
Lymphocyte #: 3.2 10*3/uL (ref 1.0–3.6)
MCH: 29.3 pg (ref 26.0–34.0)
MCHC: 32.4 g/dL (ref 32.0–36.0)
MCV: 90 fL (ref 80–100)
MONO ABS: 0.8 x10 3/mm (ref 0.2–0.9)
MONOS PCT: 10.8 %
Neutrophil #: 3.6 10*3/uL (ref 1.4–6.5)
Neutrophil %: 46.7 %
Platelet: 230 10*3/uL (ref 150–440)
RBC: 4.76 10*6/uL (ref 3.80–5.20)
RDW: 14.5 % (ref 11.5–14.5)
WBC: 7.7 10*3/uL (ref 3.6–11.0)

## 2013-07-24 LAB — COMPREHENSIVE METABOLIC PANEL
ALK PHOS: 59 U/L
ANION GAP: 6 — AB (ref 7–16)
Albumin: 3.8 g/dL (ref 3.4–5.0)
BILIRUBIN TOTAL: 0.5 mg/dL (ref 0.2–1.0)
BUN: 13 mg/dL (ref 7–18)
CREATININE: 0.74 mg/dL (ref 0.60–1.30)
Calcium, Total: 9.4 mg/dL (ref 8.5–10.1)
Chloride: 101 mmol/L (ref 98–107)
Co2: 32 mmol/L (ref 21–32)
EGFR (African American): >60
EGFR (Non-African Amer.): >60
Glucose: 74 mg/dL (ref 65–99)
OSMOLALITY: 276 (ref 275–301)
Potassium: 2.7 mmol/L — ABNORMAL LOW (ref 3.5–5.1)
SGOT(AST): 23 U/L (ref 15–37)
SGPT (ALT): 21 U/L (ref 12–78)
Sodium: 139 mmol/L (ref 136–145)
Total Protein: 7.8 g/dL (ref 6.4–8.2)

## 2013-07-24 LAB — URINALYSIS, COMPLETE
BLOOD: NEGATIVE
Bacteria: NONE SEEN
Bilirubin,UR: NEGATIVE
Glucose,UR: NEGATIVE mg/dL (ref 0–75)
Ketone: NEGATIVE
NITRITE: NEGATIVE
Ph: 5 (ref 4.5–8.0)
Protein: NEGATIVE
RBC, UR: NONE SEEN /HPF (ref 0–5)
SPECIFIC GRAVITY: 1.023 (ref 1.003–1.030)

## 2013-07-24 LAB — LIPASE, BLOOD: LIPASE: 94 U/L (ref 73–393)

## 2013-07-25 LAB — CBC WITH DIFFERENTIAL/PLATELET
BASOS ABS: 0 10*3/uL (ref 0.0–0.1)
BASOS PCT: 0.2 %
EOS ABS: 0.1 10*3/uL (ref 0.0–0.7)
EOS PCT: 0.8 %
HCT: 40.5 % (ref 35.0–47.0)
HGB: 12.8 g/dL (ref 12.0–16.0)
LYMPHS PCT: 10.5 %
Lymphocyte #: 1.2 10*3/uL (ref 1.0–3.6)
MCH: 28.9 pg (ref 26.0–34.0)
MCHC: 31.5 g/dL — ABNORMAL LOW (ref 32.0–36.0)
MCV: 92 fL (ref 80–100)
MONOS PCT: 7 %
Monocyte #: 0.8 x10 3/mm (ref 0.2–0.9)
NEUTROS ABS: 9.1 10*3/uL — AB (ref 1.4–6.5)
NEUTROS PCT: 81.5 %
Platelet: 195 10*3/uL (ref 150–440)
RBC: 4.42 10*6/uL (ref 3.80–5.20)
RDW: 14.2 % (ref 11.5–14.5)
WBC: 11.2 10*3/uL — AB (ref 3.6–11.0)

## 2013-07-25 LAB — BASIC METABOLIC PANEL
Anion Gap: 3 — ABNORMAL LOW (ref 7–16)
BUN: 9 mg/dL (ref 7–18)
CALCIUM: 8.4 mg/dL — AB (ref 8.5–10.1)
CO2: 28 mmol/L (ref 21–32)
Chloride: 109 mmol/L — ABNORMAL HIGH (ref 98–107)
Creatinine: 0.51 mg/dL — ABNORMAL LOW (ref 0.60–1.30)
EGFR (Non-African Amer.): >60
Glucose: 88 mg/dL (ref 65–99)
OSMOLALITY: 278 (ref 275–301)
Potassium: 4.4 mmol/L (ref 3.5–5.1)
Sodium: 140 mmol/L (ref 136–145)

## 2013-07-25 LAB — MAGNESIUM: Magnesium: 2 mg/dL

## 2013-07-25 LAB — TSH: THYROID STIMULATING HORM: 0.464 u[IU]/mL

## 2013-07-26 LAB — CEA: CEA: 1.5 ng/mL (ref 0.0–4.7)

## 2014-01-16 ENCOUNTER — Emergency Department: Payer: Self-pay | Admitting: Emergency Medicine

## 2014-01-16 DIAGNOSIS — Z90710 Acquired absence of both cervix and uterus: Secondary | ICD-10-CM | POA: Diagnosis not present

## 2014-01-16 DIAGNOSIS — Z7982 Long term (current) use of aspirin: Secondary | ICD-10-CM | POA: Diagnosis not present

## 2014-01-16 DIAGNOSIS — R531 Weakness: Secondary | ICD-10-CM | POA: Diagnosis not present

## 2014-01-16 DIAGNOSIS — B349 Viral infection, unspecified: Secondary | ICD-10-CM | POA: Diagnosis not present

## 2014-01-16 DIAGNOSIS — R112 Nausea with vomiting, unspecified: Secondary | ICD-10-CM | POA: Diagnosis not present

## 2014-01-16 DIAGNOSIS — Z1639 Resistance to other specified antimicrobial drug: Secondary | ICD-10-CM | POA: Diagnosis not present

## 2014-01-16 DIAGNOSIS — Z79899 Other long term (current) drug therapy: Secondary | ICD-10-CM | POA: Diagnosis not present

## 2014-01-16 DIAGNOSIS — I10 Essential (primary) hypertension: Secondary | ICD-10-CM | POA: Diagnosis not present

## 2014-01-16 DIAGNOSIS — E119 Type 2 diabetes mellitus without complications: Secondary | ICD-10-CM | POA: Diagnosis not present

## 2014-01-16 DIAGNOSIS — Z880 Allergy status to penicillin: Secondary | ICD-10-CM | POA: Diagnosis not present

## 2014-01-16 DIAGNOSIS — I517 Cardiomegaly: Secondary | ICD-10-CM | POA: Diagnosis not present

## 2014-01-16 DIAGNOSIS — R05 Cough: Secondary | ICD-10-CM | POA: Diagnosis not present

## 2014-01-16 DIAGNOSIS — Z794 Long term (current) use of insulin: Secondary | ICD-10-CM | POA: Diagnosis not present

## 2014-01-16 DIAGNOSIS — R1084 Generalized abdominal pain: Secondary | ICD-10-CM | POA: Diagnosis not present

## 2014-01-16 LAB — BASIC METABOLIC PANEL
ANION GAP: 9 (ref 7–16)
BUN: 28 mg/dL — ABNORMAL HIGH (ref 7–18)
CHLORIDE: 98 mmol/L (ref 98–107)
CO2: 31 mmol/L (ref 21–32)
CREATININE: 0.79 mg/dL (ref 0.60–1.30)
Calcium, Total: 9.2 mg/dL (ref 8.5–10.1)
EGFR (African American): >60
EGFR (Non-African Amer.): >60
GLUCOSE: 104 mg/dL — AB (ref 65–99)
OSMOLALITY: 281 (ref 275–301)
Potassium: 3.2 mmol/L — ABNORMAL LOW (ref 3.5–5.1)
SODIUM: 138 mmol/L (ref 136–145)

## 2014-01-16 LAB — URINALYSIS, COMPLETE
BLOOD: NEGATIVE
Bacteria: NONE SEEN
Bilirubin,UR: NEGATIVE
Glucose,UR: NEGATIVE mg/dL (ref 0–75)
KETONE: NEGATIVE
LEUKOCYTE ESTERASE: NEGATIVE
NITRITE: NEGATIVE
Ph: 5 (ref 4.5–8.0)
RBC,UR: <1 /HPF (ref 0–5)
SPECIFIC GRAVITY: 1.025 (ref 1.003–1.030)

## 2014-01-16 LAB — CBC WITH DIFFERENTIAL/PLATELET
Basophil #: 0.1 10*3/uL (ref 0.0–0.1)
Basophil %: 0.6 %
EOS ABS: 0.1 10*3/uL (ref 0.0–0.7)
EOS PCT: 0.4 %
HCT: 50.4 % — AB (ref 35.0–47.0)
HGB: 16.1 g/dL — ABNORMAL HIGH (ref 12.0–16.0)
LYMPHS ABS: 4.2 10*3/uL — AB (ref 1.0–3.6)
LYMPHS PCT: 32.9 %
MCH: 28.9 pg (ref 26.0–34.0)
MCHC: 32 g/dL (ref 32.0–36.0)
MCV: 90 fL (ref 80–100)
MONOS PCT: 11.3 %
Monocyte #: 1.4 x10 3/mm — ABNORMAL HIGH (ref 0.2–0.9)
Neutrophil #: 7 10*3/uL — ABNORMAL HIGH (ref 1.4–6.5)
Neutrophil %: 54.8 %
PLATELETS: 230 10*3/uL (ref 150–440)
RBC: 5.58 10*6/uL — ABNORMAL HIGH (ref 3.80–5.20)
RDW: 14.8 % — AB (ref 11.5–14.5)
WBC: 12.7 10*3/uL — AB (ref 3.6–11.0)

## 2014-01-16 LAB — TROPONIN I: Troponin-I: <0.02 ng/mL

## 2014-02-25 ENCOUNTER — Emergency Department: Payer: Self-pay | Admitting: Emergency Medicine

## 2014-02-25 DIAGNOSIS — F419 Anxiety disorder, unspecified: Secondary | ICD-10-CM | POA: Diagnosis not present

## 2014-02-25 DIAGNOSIS — Z79899 Other long term (current) drug therapy: Secondary | ICD-10-CM | POA: Diagnosis not present

## 2014-02-25 DIAGNOSIS — R197 Diarrhea, unspecified: Secondary | ICD-10-CM | POA: Diagnosis not present

## 2014-02-25 DIAGNOSIS — E119 Type 2 diabetes mellitus without complications: Secondary | ICD-10-CM | POA: Diagnosis not present

## 2014-02-25 DIAGNOSIS — I10 Essential (primary) hypertension: Secondary | ICD-10-CM | POA: Diagnosis not present

## 2014-02-25 DIAGNOSIS — Z87891 Personal history of nicotine dependence: Secondary | ICD-10-CM | POA: Diagnosis not present

## 2014-02-25 DIAGNOSIS — Z880 Allergy status to penicillin: Secondary | ICD-10-CM | POA: Diagnosis not present

## 2014-02-25 LAB — URINALYSIS, COMPLETE
Bacteria: NONE SEEN
Bilirubin,UR: NEGATIVE
Blood: NEGATIVE
GLUCOSE, UR: NEGATIVE mg/dL (ref 0–75)
KETONE: NEGATIVE
NITRITE: NEGATIVE
PH: 5 (ref 4.5–8.0)
Protein: NEGATIVE
Specific Gravity: 1.015 (ref 1.003–1.030)

## 2014-02-25 LAB — COMPREHENSIVE METABOLIC PANEL
ALBUMIN: 3.2 g/dL — AB (ref 3.4–5.0)
ANION GAP: 4 — AB (ref 7–16)
Alkaline Phosphatase: 62 U/L
BILIRUBIN TOTAL: 0.6 mg/dL (ref 0.2–1.0)
BUN: 10 mg/dL (ref 7–18)
CALCIUM: 8.3 mg/dL — AB (ref 8.5–10.1)
CO2: 28 mmol/L (ref 21–32)
Chloride: 111 mmol/L — ABNORMAL HIGH (ref 98–107)
Creatinine: 0.62 mg/dL (ref 0.60–1.30)
EGFR (African American): >60
GLUCOSE: 78 mg/dL (ref 65–99)
Osmolality: 283 (ref 275–301)
POTASSIUM: 3.6 mmol/L (ref 3.5–5.1)
SGOT(AST): 20 U/L (ref 15–37)
SGPT (ALT): 11 U/L — ABNORMAL LOW
Sodium: 143 mmol/L (ref 136–145)
TOTAL PROTEIN: 6.6 g/dL (ref 6.4–8.2)

## 2014-02-25 LAB — CBC
HCT: 39.6 % (ref 35.0–47.0)
HGB: 12.7 g/dL (ref 12.0–16.0)
MCH: 29.3 pg (ref 26.0–34.0)
MCHC: 32.1 g/dL (ref 32.0–36.0)
MCV: 91 fL (ref 80–100)
Platelet: 192 10*3/uL (ref 150–440)
RBC: 4.34 10*6/uL (ref 3.80–5.20)
RDW: 15.5 % — AB (ref 11.5–14.5)
WBC: 7.1 10*3/uL (ref 3.6–11.0)

## 2014-02-25 LAB — LIPASE, BLOOD: LIPASE: 76 U/L (ref 73–393)

## 2014-02-28 ENCOUNTER — Ambulatory Visit: Payer: Self-pay | Admitting: Internal Medicine

## 2014-02-28 DIAGNOSIS — E119 Type 2 diabetes mellitus without complications: Secondary | ICD-10-CM | POA: Diagnosis not present

## 2014-02-28 DIAGNOSIS — Z79899 Other long term (current) drug therapy: Secondary | ICD-10-CM | POA: Diagnosis not present

## 2014-02-28 DIAGNOSIS — K449 Diaphragmatic hernia without obstruction or gangrene: Secondary | ICD-10-CM | POA: Diagnosis not present

## 2014-02-28 DIAGNOSIS — R1110 Vomiting, unspecified: Secondary | ICD-10-CM | POA: Diagnosis not present

## 2014-02-28 DIAGNOSIS — R1909 Other intra-abdominal and pelvic swelling, mass and lump: Secondary | ICD-10-CM | POA: Diagnosis not present

## 2014-02-28 DIAGNOSIS — I10 Essential (primary) hypertension: Secondary | ICD-10-CM | POA: Diagnosis not present

## 2014-02-28 DIAGNOSIS — E785 Hyperlipidemia, unspecified: Secondary | ICD-10-CM | POA: Diagnosis not present

## 2014-02-28 DIAGNOSIS — R590 Localized enlarged lymph nodes: Secondary | ICD-10-CM | POA: Diagnosis not present

## 2014-02-28 DIAGNOSIS — Z882 Allergy status to sulfonamides status: Secondary | ICD-10-CM | POA: Diagnosis not present

## 2014-02-28 DIAGNOSIS — K5790 Diverticulosis of intestine, part unspecified, without perforation or abscess without bleeding: Secondary | ICD-10-CM | POA: Diagnosis not present

## 2014-02-28 DIAGNOSIS — Z90710 Acquired absence of both cervix and uterus: Secondary | ICD-10-CM | POA: Diagnosis not present

## 2014-02-28 DIAGNOSIS — Z86010 Personal history of colonic polyps: Secondary | ICD-10-CM | POA: Diagnosis not present

## 2014-02-28 DIAGNOSIS — K6389 Other specified diseases of intestine: Secondary | ICD-10-CM | POA: Diagnosis not present

## 2014-02-28 DIAGNOSIS — Z8673 Personal history of transient ischemic attack (TIA), and cerebral infarction without residual deficits: Secondary | ICD-10-CM | POA: Diagnosis not present

## 2014-02-28 DIAGNOSIS — Z880 Allergy status to penicillin: Secondary | ICD-10-CM | POA: Diagnosis not present

## 2014-02-28 LAB — PROTIME-INR
INR: 1
PROTHROMBIN TIME: 12.6 s (ref 11.5–14.7)

## 2014-02-28 LAB — CBC CANCER CENTER
BASOS PCT: 0.5 %
Basophil #: 0 x10 3/mm (ref 0.0–0.1)
EOS ABS: 0.1 x10 3/mm (ref 0.0–0.7)
Eosinophil %: 1.3 %
HCT: 40.6 % (ref 35.0–47.0)
HGB: 13 g/dL (ref 12.0–16.0)
LYMPHS ABS: 2.2 x10 3/mm (ref 1.0–3.6)
Lymphocyte %: 37.2 %
MCH: 29.5 pg (ref 26.0–34.0)
MCHC: 32.1 g/dL (ref 32.0–36.0)
MCV: 92 fL (ref 80–100)
MONO ABS: 0.4 x10 3/mm (ref 0.2–0.9)
Monocyte %: 7.5 %
NEUTROS ABS: 3.2 x10 3/mm (ref 1.4–6.5)
Neutrophil %: 53.5 %
Platelet: 207 x10 3/mm (ref 150–440)
RBC: 4.42 10*6/uL (ref 3.80–5.20)
RDW: 15.9 % — ABNORMAL HIGH (ref 11.5–14.5)
WBC: 5.9 x10 3/mm (ref 3.6–11.0)

## 2014-02-28 LAB — CREATININE, SERUM
CREATININE: 0.63 mg/dL (ref 0.60–1.30)
EGFR (Non-African Amer.): >60

## 2014-02-28 LAB — APTT: Activated PTT: 30.7 secs (ref 23.6–35.9)

## 2014-03-01 LAB — CEA: CEA: 1.6 ng/mL (ref 0.0–4.7)

## 2014-03-01 LAB — CA 125: CA 125: 7.2 U/mL (ref 0.0–34.0)

## 2014-03-06 DIAGNOSIS — R1110 Vomiting, unspecified: Secondary | ICD-10-CM | POA: Diagnosis not present

## 2014-03-06 DIAGNOSIS — K6389 Other specified diseases of intestine: Secondary | ICD-10-CM | POA: Diagnosis not present

## 2014-03-06 DIAGNOSIS — R1909 Other intra-abdominal and pelvic swelling, mass and lump: Secondary | ICD-10-CM | POA: Diagnosis not present

## 2014-03-06 DIAGNOSIS — K5660 Unspecified intestinal obstruction: Secondary | ICD-10-CM | POA: Diagnosis not present

## 2014-03-06 DIAGNOSIS — I10 Essential (primary) hypertension: Secondary | ICD-10-CM | POA: Diagnosis not present

## 2014-03-06 DIAGNOSIS — K5730 Diverticulosis of large intestine without perforation or abscess without bleeding: Secondary | ICD-10-CM | POA: Diagnosis not present

## 2014-03-06 DIAGNOSIS — K5790 Diverticulosis of intestine, part unspecified, without perforation or abscess without bleeding: Secondary | ICD-10-CM | POA: Diagnosis not present

## 2014-03-06 DIAGNOSIS — R05 Cough: Secondary | ICD-10-CM | POA: Diagnosis not present

## 2014-03-06 DIAGNOSIS — R590 Localized enlarged lymph nodes: Secondary | ICD-10-CM | POA: Diagnosis not present

## 2014-03-07 ENCOUNTER — Ambulatory Visit: Payer: Self-pay | Admitting: Internal Medicine

## 2014-03-07 DIAGNOSIS — R1110 Vomiting, unspecified: Secondary | ICD-10-CM | POA: Diagnosis not present

## 2014-03-07 DIAGNOSIS — R590 Localized enlarged lymph nodes: Secondary | ICD-10-CM | POA: Diagnosis not present

## 2014-03-07 DIAGNOSIS — K5790 Diverticulosis of intestine, part unspecified, without perforation or abscess without bleeding: Secondary | ICD-10-CM | POA: Diagnosis not present

## 2014-03-07 DIAGNOSIS — E119 Type 2 diabetes mellitus without complications: Secondary | ICD-10-CM | POA: Diagnosis not present

## 2014-03-07 DIAGNOSIS — K6389 Other specified diseases of intestine: Secondary | ICD-10-CM | POA: Diagnosis not present

## 2014-03-07 DIAGNOSIS — E785 Hyperlipidemia, unspecified: Secondary | ICD-10-CM | POA: Diagnosis not present

## 2014-03-07 DIAGNOSIS — R451 Restlessness and agitation: Secondary | ICD-10-CM | POA: Diagnosis not present

## 2014-03-07 DIAGNOSIS — Z79899 Other long term (current) drug therapy: Secondary | ICD-10-CM | POA: Diagnosis not present

## 2014-03-07 DIAGNOSIS — K449 Diaphragmatic hernia without obstruction or gangrene: Secondary | ICD-10-CM | POA: Diagnosis not present

## 2014-03-07 DIAGNOSIS — I10 Essential (primary) hypertension: Secondary | ICD-10-CM | POA: Diagnosis not present

## 2014-03-07 DIAGNOSIS — R140 Abdominal distension (gaseous): Secondary | ICD-10-CM | POA: Diagnosis not present

## 2014-03-07 DIAGNOSIS — F419 Anxiety disorder, unspecified: Secondary | ICD-10-CM | POA: Diagnosis not present

## 2014-03-07 DIAGNOSIS — Z87891 Personal history of nicotine dependence: Secondary | ICD-10-CM | POA: Diagnosis not present

## 2014-03-07 DIAGNOSIS — Z90710 Acquired absence of both cervix and uterus: Secondary | ICD-10-CM | POA: Diagnosis not present

## 2014-03-07 DIAGNOSIS — K5669 Other intestinal obstruction: Secondary | ICD-10-CM | POA: Diagnosis not present

## 2014-03-09 ENCOUNTER — Ambulatory Visit: Payer: Self-pay | Admitting: Gastroenterology

## 2014-03-09 DIAGNOSIS — K529 Noninfective gastroenteritis and colitis, unspecified: Secondary | ICD-10-CM | POA: Diagnosis not present

## 2014-03-09 DIAGNOSIS — D123 Benign neoplasm of transverse colon: Secondary | ICD-10-CM | POA: Diagnosis not present

## 2014-03-09 DIAGNOSIS — Z888 Allergy status to other drugs, medicaments and biological substances status: Secondary | ICD-10-CM | POA: Diagnosis not present

## 2014-03-09 DIAGNOSIS — K5660 Unspecified intestinal obstruction: Secondary | ICD-10-CM | POA: Diagnosis not present

## 2014-03-09 DIAGNOSIS — K5730 Diverticulosis of large intestine without perforation or abscess without bleeding: Secondary | ICD-10-CM | POA: Diagnosis not present

## 2014-03-09 DIAGNOSIS — K6389 Other specified diseases of intestine: Secondary | ICD-10-CM | POA: Diagnosis not present

## 2014-03-09 DIAGNOSIS — D122 Benign neoplasm of ascending colon: Secondary | ICD-10-CM | POA: Diagnosis not present

## 2014-03-09 DIAGNOSIS — I10 Essential (primary) hypertension: Secondary | ICD-10-CM | POA: Diagnosis not present

## 2014-03-09 DIAGNOSIS — Z79899 Other long term (current) drug therapy: Secondary | ICD-10-CM | POA: Diagnosis not present

## 2014-03-09 DIAGNOSIS — Z8673 Personal history of transient ischemic attack (TIA), and cerebral infarction without residual deficits: Secondary | ICD-10-CM | POA: Diagnosis not present

## 2014-03-09 DIAGNOSIS — M1990 Unspecified osteoarthritis, unspecified site: Secondary | ICD-10-CM | POA: Diagnosis not present

## 2014-03-09 DIAGNOSIS — E119 Type 2 diabetes mellitus without complications: Secondary | ICD-10-CM | POA: Diagnosis not present

## 2014-03-09 DIAGNOSIS — K641 Second degree hemorrhoids: Secondary | ICD-10-CM | POA: Diagnosis not present

## 2014-03-09 DIAGNOSIS — R933 Abnormal findings on diagnostic imaging of other parts of digestive tract: Secondary | ICD-10-CM | POA: Diagnosis not present

## 2014-03-09 DIAGNOSIS — Z720 Tobacco use: Secondary | ICD-10-CM | POA: Diagnosis not present

## 2014-03-23 DIAGNOSIS — K5669 Other intestinal obstruction: Secondary | ICD-10-CM | POA: Diagnosis not present

## 2014-04-03 ENCOUNTER — Ambulatory Visit: Payer: Self-pay | Admitting: Surgery

## 2014-04-03 DIAGNOSIS — E119 Type 2 diabetes mellitus without complications: Secondary | ICD-10-CM

## 2014-04-03 DIAGNOSIS — Z01810 Encounter for preprocedural cardiovascular examination: Secondary | ICD-10-CM | POA: Diagnosis not present

## 2014-04-03 DIAGNOSIS — Z01812 Encounter for preprocedural laboratory examination: Secondary | ICD-10-CM | POA: Diagnosis not present

## 2014-04-03 DIAGNOSIS — I10 Essential (primary) hypertension: Secondary | ICD-10-CM | POA: Diagnosis not present

## 2014-04-03 DIAGNOSIS — K5669 Other intestinal obstruction: Secondary | ICD-10-CM | POA: Diagnosis not present

## 2014-04-03 LAB — BASIC METABOLIC PANEL
ANION GAP: 8 (ref 7–16)
BUN: 15 mg/dL (ref 7–18)
CO2: 28 mmol/L (ref 21–32)
CREATININE: 0.44 mg/dL — AB (ref 0.60–1.30)
Calcium, Total: 9.1 mg/dL (ref 8.5–10.1)
Chloride: 102 mmol/L (ref 98–107)
EGFR (Non-African Amer.): >60
Glucose: 112 mg/dL — ABNORMAL HIGH (ref 65–99)
Osmolality: 277 (ref 275–301)
Potassium: 3.3 mmol/L — ABNORMAL LOW (ref 3.5–5.1)
Sodium: 138 mmol/L (ref 136–145)

## 2014-04-07 ENCOUNTER — Ambulatory Visit: Payer: Self-pay | Admitting: Internal Medicine

## 2014-04-10 DIAGNOSIS — I10 Essential (primary) hypertension: Secondary | ICD-10-CM | POA: Diagnosis not present

## 2014-04-11 ENCOUNTER — Inpatient Hospital Stay: Payer: Self-pay | Admitting: Surgery

## 2014-04-11 DIAGNOSIS — C772 Secondary and unspecified malignant neoplasm of intra-abdominal lymph nodes: Secondary | ICD-10-CM | POA: Diagnosis not present

## 2014-04-11 DIAGNOSIS — T4145XA Adverse effect of unspecified anesthetic, initial encounter: Secondary | ICD-10-CM | POA: Diagnosis not present

## 2014-04-11 DIAGNOSIS — I10 Essential (primary) hypertension: Secondary | ICD-10-CM | POA: Diagnosis present

## 2014-04-11 DIAGNOSIS — C180 Malignant neoplasm of cecum: Secondary | ICD-10-CM | POA: Diagnosis not present

## 2014-04-11 DIAGNOSIS — Z880 Allergy status to penicillin: Secondary | ICD-10-CM | POA: Diagnosis not present

## 2014-04-11 DIAGNOSIS — C7A021 Malignant carcinoid tumor of the cecum: Secondary | ICD-10-CM | POA: Diagnosis present

## 2014-04-11 DIAGNOSIS — E780 Pure hypercholesterolemia: Secondary | ICD-10-CM | POA: Diagnosis present

## 2014-04-11 DIAGNOSIS — C7B01 Secondary carcinoid tumors of distant lymph nodes: Secondary | ICD-10-CM | POA: Diagnosis present

## 2014-04-11 DIAGNOSIS — D121 Benign neoplasm of appendix: Secondary | ICD-10-CM | POA: Diagnosis not present

## 2014-04-11 DIAGNOSIS — Z886 Allergy status to analgesic agent status: Secondary | ICD-10-CM | POA: Diagnosis not present

## 2014-04-11 DIAGNOSIS — D490 Neoplasm of unspecified behavior of digestive system: Secondary | ICD-10-CM | POA: Diagnosis not present

## 2014-04-11 DIAGNOSIS — F28 Other psychotic disorder not due to a substance or known physiological condition: Secondary | ICD-10-CM | POA: Diagnosis present

## 2014-04-11 DIAGNOSIS — Z8673 Personal history of transient ischemic attack (TIA), and cerebral infarction without residual deficits: Secondary | ICD-10-CM | POA: Diagnosis not present

## 2014-04-11 DIAGNOSIS — E119 Type 2 diabetes mellitus without complications: Secondary | ICD-10-CM | POA: Diagnosis not present

## 2014-04-11 DIAGNOSIS — T402X5A Adverse effect of other opioids, initial encounter: Secondary | ICD-10-CM | POA: Diagnosis not present

## 2014-04-11 DIAGNOSIS — Z79899 Other long term (current) drug therapy: Secondary | ICD-10-CM | POA: Diagnosis not present

## 2014-04-11 DIAGNOSIS — Z885 Allergy status to narcotic agent status: Secondary | ICD-10-CM | POA: Diagnosis not present

## 2014-04-11 DIAGNOSIS — M1990 Unspecified osteoarthritis, unspecified site: Secondary | ICD-10-CM | POA: Diagnosis present

## 2014-04-11 DIAGNOSIS — Z888 Allergy status to other drugs, medicaments and biological substances status: Secondary | ICD-10-CM | POA: Diagnosis not present

## 2014-04-11 DIAGNOSIS — K5660 Unspecified intestinal obstruction: Secondary | ICD-10-CM | POA: Diagnosis present

## 2014-04-11 DIAGNOSIS — Z881 Allergy status to other antibiotic agents status: Secondary | ICD-10-CM | POA: Diagnosis not present

## 2014-04-11 DIAGNOSIS — C7A012 Malignant carcinoid tumor of the ileum: Secondary | ICD-10-CM | POA: Diagnosis not present

## 2014-04-11 DIAGNOSIS — Z7982 Long term (current) use of aspirin: Secondary | ICD-10-CM | POA: Diagnosis not present

## 2014-04-11 DIAGNOSIS — Z882 Allergy status to sulfonamides status: Secondary | ICD-10-CM | POA: Diagnosis not present

## 2014-04-11 LAB — PLATELET COUNT: PLATELETS: 212 10*3/uL (ref 150–440)

## 2014-04-11 LAB — POTASSIUM: Potassium: 3.5 mmol/L (ref 3.5–5.1)

## 2014-04-18 LAB — PLATELET COUNT: PLATELETS: 312 10*3/uL (ref 150–440)

## 2014-07-29 NOTE — Consult Note (Signed)
PATIENT NAME:  Lisa Hahn, Lisa Hahn MR#:  [id number] DATE OF BIRTH:  1940-09-21  DATE OF CONSULTATION:  07/24/2013  CONSULTING PHYSICIAN:  Leonie Douglas. Doy Hutching, MD  REFERRING PHYSICIAN: Dr. Rexene Edison of Surgery  FAMILY PHYSICIAN: Dr. Dear  REASON FOR CONSULTATION: Hypertension and diabetes.   HISTORY OF PRESENT ILLNESS: The patient is a 74 year old female with a history of benign hypertension, type 2 diabetes, and previous stroke with no residual, who presented to the Emergency Room with abdominal pain x 2 weeks. She was diagnosed with a mass in her colon approximately 2 weeks ago. In the Emergency Room tonight, the patient was noted to have obstruction, and was admitted for further evaluation.   PAST MEDICAL HISTORY: 1.  Type 2 diabetes.  2.  Benign hypertension.  3.  Previous stroke, with no residual.  4.  Bilateral hearing loss.  5.  Osteoarthritis.  6.  History of thyroid cyst, status post excision.  7.  Status post hysterectomy.  8.  Status post left knee surgery.  9.  Status post right foot surgery.   MEDICATIONS: 1.  Zofran 4 mg p.o. q. 4 hours p.r.n.  2.  Norco 5/325, 1 p.o. q. 6 hours p.r.n. pain.  3.  Metformin 1000 mg p.o. b.i.d.  4.  Lantus 30 units subcu at bedtime.  5.  Atenolol 100 mg p.o. daily.   ALLERGIES: ACE INHIBITORS, MOTRIN, ASPIRIN, QUINOLONES, PENICILLIN, SULFA, ZANTAC, and MACROBID.   SOCIAL HISTORY: The patient has a remote history of tobacco abuse, but none recently. Denies alcohol abuse.   FAMILY HISTORY: Positive for diabetes, stroke, and hypertension.   REVIEW OF SYSTEMS:   CONSTITUTIONAL: No fever or change in weight.  EYES: No blurry or double vision. No glaucoma.  ENT: No tinnitus or hearing loss. No nasal discharge or bleeding. No difficulty swallowing.  RESPIRATORY: No cough or wheezing. Denies hemoptysis.  CARDIOVASCULAR: No chest pain or orthopnea. No palpitations or syncope.  GASTROINTESTINAL: As per HPI.   GENITOURINARY: Denies dysuria or  hematuria. No incontinence.  ENDOCRINE: No polyuria or polydipsia. No heat or cold intolerance.  HEMATOLOGIC: The patient denies anemia, easy bruising, or bleeding.  LYMPHATIC: No swollen glands.  MUSCULOSKELETAL: The patient denies pain in her neck, back, shoulders, knees, hips. No gout.  NEUROLOGIC: No numbness or migraines. Denies seizures.  PSYCHIATRIC:  The patient denies anxiety, insomnia or depression.   PHYSICAL EXAMINATION: GENERAL: The patient is in no acute distress., Well-developed, well-nourished.  VITAL SIGNS: Remarkable for a blood pressure of 159/87, heart rate 84, respiratory rate of 18, temperature of 98.1, with a sat of 96% on room air.  HEENT: Normocephalic, atraumatic. Pupils equally round and reactive to light and accommodation. Extraocular movements are intact. Sclerae not icteric. Conjunctivae are clear.  Oropharynx is clear.  NECK: Supple, without JVD. No adenopathy or thyromegaly is noted.  LUNGS: Clear to auscultation and percussion, without wheezes, rales or rhonchi. No dullness. Respiratory effort is normal.  CARDIAC EXAM: Regular rate and rhythm. Normal S1, S2. No significant rubs, murmurs or gallops. PMI is nondisplaced. Chest wall is nontender.  ABDOMEN: Mildly distended and diffusely tender, without rebound. Hyperactive bowel sounds. No obvious masses or hernias noted.  EXTREMITIES: Without clubbing, cyanosis, edema. Pulses were 2+ bilaterally.  SKIN: Warm and dry, without rash or lesions.  NEUROLOGIC EXAM: Revealed cranial nerves II through XII grossly intact. Deep tendon reflexes were symmetric. Motor and sensory exams nonfocal.  PSYCHIATRIC EXAM: Revealed a patient who was alert and oriented to person, place, and time. She  was cooperative and used good judgment.   LABORATORY DATA: Glucose was 74, with a BUN of 13, creatinine of 0.74, with a sodium of 139, potassium of 2.7. GFR was greater than 60. Lipase was 94. Her white count was 7.7, with a hemoglobin of  13.9. Urinalysis was negative. 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   ASSESSMENT: 1.  Benign hypertension.  2.  Type 2 diabetes.  3.  Hypokalemia.  4.  Previous stroke.  5.  History of thyroid cyst excision.  6.  Bowel obstruction.   PLAN: The patient is n.p.o. Agree with holding her Lantus and continuing sliding scale insulin with Accu-Cheks q. 6 hours. The patient will continue her atenolol p.o. per Surgery. Her sugars and blood pressure appear under adequate control at this time. Agree with potassium supplementation. Will check labs in the morning, including a magnesium level and TSH. Agree with current care.   Thank you for the consultation. Will continue to follow this patient with you while in the hospital. Please call if questions arise.   Total time spent on this patient was 45 minutes.     ____________________________ Leonie Douglas Doy Hutching, MD jds:mr D: 07/24/2013 19:50:18 ET T: 07/24/2013 20:45:51 ET JOB#: [id number]  cc: Leonie Douglas. Doy Hutching, MD, <Dictator> Catha Ontko Lennice Sites MD ELECTRONICALLY SIGNED 07/25/2013 7:55

## 2014-07-29 NOTE — Consult Note (Signed)
PATIENT NAME:  Lisa Hahn, Lisa Hahn MR#:  [id number] DATE OF BIRTH:  01/27/1941  DATE OF CONSULTATION:  05/23/2013  REFERRING PHYSICIAN:   CONSULTING PHYSICIAN:  Tipton Ballow A. Marina Gravel, MD  REASON FOR CONSULTATION: Abdominal pain and right lower quadrant mass.  PRIMARY CARE PHYSICIAN: Dr. Marcie Bal Dear and Dr. Brynda Greathouse.   HISTORY: This is a 74 year old,  obese, black female with a history of hypertension and type 1 diabetes, who presents to the Emergency Room with a 1-week history of abdominal pain centered in her right lower quadrant and suprapubic region associated with multiple episodes of nausea and vomiting. No fever. No jaundice. No sick contacts. She has a predated history of significant abdominal bloating for several weeks prior.   The patient is rather poor historian. She relates a previous hysterectomy as well as a hemorrhoidectomy in the past. In the year 8938, had an umbilical hernia repair with mesh performed by Dr. Rochel Brome. Imaging in the Emergency Room with a contrasted CT scan demonstrates markedly thickened loop of terminal ileum at the ileocecal valve with mass effect and lymphadenopathy within ileocolonic pedicle. There is contrast seen throughout the entire colon. Of note, the patient had a bowel movement earlier today and passage of gas today. She has had no further vomiting since in the Emergency Room. There is evidence of colonic and descending colon diverticulosis and a small hiatal hernia. Images were personally reviewed. Surgical services were asked to comment.   ALLERGIES: AMOXICILLIN, ASPIRIN, CIPRO, LEVAQUIN, MACROBID, MOTRIN, MOXIFLOXACIN, PENICILLIN, PERCOCET AND ZANTAC.   PAST MEDICAL HISTORY: Hypertension and type 1 diabetes,   PAST SURGICAL HISTORY:  1.  Hysterectomy.  2.  Hemorrhoidectomy.  3.  Umbilical herniorrhaphy with mesh in 2011 as described above.   SOCIAL HISTORY: The patient lives with her son, who is employed here at the hospital in environmental services. The patient  is retired. The patient does not smoke or drink.   FAMILY HISTORY: Noncontributory.   REVIEW OF SYSTEMS: Significant for abdominal bloating, abdominal pain, nausea, vomiting. No diarrhea. No change in bowel habits. No bloody stools. No hematemesis. No dysuria. No weight loss. No fevers. No night sweats. Remaining 10-point review very difficult to obtain.   PHYSICAL EXAMINATION:  GENERAL: The patient is in no obvious distress. She is obese.  VITAL SIGNS: Temperature is 97.7, pulse of 73 and regular, blood pressure is 133/82, room air saturation 92%. She is 5 feet 2 inches, 270 pounds., BMI 49.4  HEAD AND NECK: Unremarkable. Extraocular muscles are intact. Facies are symmetrical. Head is atraumatic, normocephalic. Thyroid without tenderness or thyromegaly. There is no cervical lymphadenopathy present.  LUNGS: Clear.  HEART: Regular rate and rhythm.  ABDOMEN: Distended with minimal tenderness to deep palpation within the right lower quadrant. No peritoneal signs are obvious. There is a well-healed lower midline incision with no obvious hernia. Examination of her axilla demonstrated no lymphadenopathy.  EXTREMITIES: Warm and well perfused.  RECTAL AND GENITOURINARY: Deferred.  NEUROLOGIC AND PSYCHIATRIC: The patient is anxious, careful, but otherwise nonfocal in her neurological examination an alert and oriented x 4.   LABORATORY VALUES: CT scan as described above. Images were personally reviewed. Urinalysis is negative. White count is 8.6, hemoglobin 14.8, platelet count 205000, normal differential. Liver function tests are normal. Lipase is 84. Electrolytes are unremarkable. Serum glucose is 153.   IMPRESSION: This is a 74 year old black female with abdominal pain and markedly abnormal CT scan of the distal terminal ileum and cecal region with lymphadenopathy concerning for underlying infectious etiology, but  more concerning for neoplastic disease. At present, no surgical indication is present as  the patient does not have a diagnosis nor does she have evidence of complete bowel obstruction.   RECOMMENDATIONS: Medical admission. Gastroenterology evaluation. Colonoscopy with biopsy and proceed as the database grows.  TOTAL TIME SPENT: 80 minutes.  ____________________________ Jeannette How Marina Gravel, MD mab:aw D: 05/23/2013 00:50:51 ET T: 05/23/2013 07:13:51 ET JOB#: [id number]  cc: Elta Guadeloupe A. Marina Gravel, MD, <Dictator> Hortencia Conradi MD ELECTRONICALLY SIGNED 05/23/2013 15:16

## 2014-07-29 NOTE — Consult Note (Signed)
PATIENT NAME:  Lisa Hahn, Lisa Hahn MR#:  [id number] DATE OF BIRTH:  1940/12/30  DATE OF CONSULTATION:  07/25/2013  REFERRING PHYSICIAN:  Harrell Gave A. Lundquist, MD CONSULTING PHYSICIAN: Lucilla Lame, MD, Andria Meuse, NP PRIMARY CARE PHYSICIAN: Marcie Bal Dear, MD  REASON FOR CONSULTATION: Ileocecal valve mass.   HISTORY OF PRESENT ILLNESS: Lisa Hahn is a 74 year old female who presented to the hospital back in February of this year with similar complaints of abdominal pain. Lisa Hahn was seen by surgical team and was found with what looked like a ileocecal valve/terminal ileal mass with adenopathy without obstruction. Lisa Hahn was referred for outpatient GI evaluation and colonoscopy. Lisa Hahn canceled 6 office visits with our office and finally did present for March 5 consult with Dr. Allen Norris. Plans were made for colonoscopy February 26 as well as March 11 with our office, and Lisa Hahn most recently canceled the March 11 appointment and rescheduled for May 19. Lisa Hahn continued to have abdominal swelling and bloating over the last several months, which was progressive. Lisa Hahn began to have severe right upper quadrant pain. Lisa Hahn rated the pain 10 out of 10 at worst. Lisa Hahn had nausea and vomiting, which has since resolved over the last 2 days. Lisa Hahn has been seeing intermittent hematochezia for several months now. Lisa Hahn weight is stable and Lisa Hahn appetite was fine prior to the onset of Lisa Hahn most recent symptoms. Lisa Hahn white blood cell count is 11.2 today, hemoglobin 12.8. 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 KUB this morning shows that contrast does flow through to the rectum. Lisa Hahn last colonoscopy was June 14, 2009, by Dr. Candace Cruise, where Lisa Hahn had a fair prep and Lisa Hahn had one descending colon polyp removed, which was a tubular adenoma without high-grade dysplasia or malignancy. Lisa Hahn had a small bowel  biopsy with EGD at the same time, which was normal.   PAST MEDICAL AND SURGICAL HISTORY: Hypertension, diabetes mellitus, osteoarthritis, CVA, thyroid cyst, hysterectomy, knee surgery, hernia repair, right foot surgery.   MEDICATIONS PRIOR TO ADMISSION: Atenolol 100 mg daily, Excedrin PM 500 mg/38 mg daily, Lantus insulin 30 units subcutaneously at bedtime, Zofran ODT 4 mg q.4 hours p.r.n. nausea and vomiting.   ALLERGIES: INCLUDE: AMOXICILLIN CAUSES SWELLING. ASPIRIN CAUSES TACHYCARDIA. CIPRO: FACIAL SWELLING. LEVAQUIN: FACIAL SWELLING. MACROBID: RASH. MOTRIN: GI DISTRESS. MOXIFLOXACIN: SWELLING. PENICILLIN: ANGIOEDEMA. PERCOCET: GI DISTRESS. PHENAZOPYRIDINE: UNKNOWN REACTION. RAMIPRIL: AGITATION, ANXIETY. SULFA: UNKNOWN REACTION. ZANTAC: UNKNOWN REACTION.   FAMILY HISTORY: Father deceased of carcinoma of unknown etiology. Lisa Hahn lost 2 children of unknown etiology.   SOCIAL HISTORY: Lisa Hahn has a remote history of tobacco use. Denies any alcohol or illicit drug use. Lisa Hahn is a widow. Lisa Hahn lives alone. Lisa Hahn has 2 healthy children, 2 deceased.   REVIEW OF SYSTEMS: See HPI.  CONSTITUTIONAL: Lisa Hahn has had some fatigue and weakness. Otherwise negative 12-point review of systems.   PHYSICAL EXAMINATION: VITAL SIGNS: Temperature 98.7, pulse 95, respirations 18, blood pressure 147/89, O2 sat 92% on room air.  GENERAL: Lisa Hahn is a well-developed, well-nourished black female who is alert, oriented, pleasant, cooperative, in no acute distress.  HEENT: Sclerae clear, anicteric. Conjunctivae pink. Oropharynx pink and moist without any lesions.  NECK: Supple without any mass or thyromegaly.  CHEST: Heart regular rate and rhythm. Normal S1, S2. No murmurs, clicks, rubs, or gallops.  LUNGS: Clear to auscultation bilaterally.  ABDOMEN: Positive bowel sounds x 4. Abdomen  is soft, moderately distended. Lisa Hahn does have mild tenderness to the right upper quadrant and mid abdomen at the umbilicus on deep palpation. There is  no rebound, tenderness, or guarding. Unable to palpate hepatosplenomegaly.  EXTREMITIES: Without clubbing or edema.  SKIN: Warm and dry without any rash or jaundice.  NEUROLOGIC: Grossly intact.  MUSCULOSKELETAL: Good equal movement and strength bilaterally.  PSYCHIATRIC: Alert, cooperative, normal mood and affect.   LABORATORY STUDIES: Chloride 109, calcium 8.4, creatinine 0.51, anion gap 3, otherwise normal met-7. LFTs normal. TSH normal.   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 Lisa Hahn has ceased vomiting and CT contrast did pass Lisa Hahn rectum. Lisa Hahn was supposed to come see [place] and set up colonoscopy but canceled Lisa Hahn appointment 6 times since Lisa Hahn February referral. Lisa Hahn canceled colonoscopy set up for February 26 and March 11 as well and had rescheduled to May 19. However, Lisa Hahn presented with pain, nausea, and vomiting to the ER for this admission. Colonoscopy with biopsies tomorrow with Dr. Allen Norris. I have discussed risks and benefits, which include but are not limited to bleeding, infection, perforation, drug reaction. Lisa Hahn agrees with this plan, and consent will be obtained.   PLAN: Slow, normal GoLYTELY prep. Will start at noon today, n.p.o. after midnight, clear liquids today, supportive measures. Hold Lisa Hahn heparin.   Thank you for allowing [place] to participate in Lisa Hahn care.   ____________________________ Andria Meuse, NP klj:jcm D: 07/25/2013 13:03:53 ET T: 07/25/2013 13:22:41 ET JOB#: [id number]  cc: Andria Meuse, NP, <Dictator> Christopher A. Lundquist, MD Lucilla Lame, MD Marcie Bal Dear, MD Andria Meuse FNP ELECTRONICALLY SIGNED 07/26/2013 14:28

## 2014-07-29 NOTE — Consult Note (Signed)
Brief Consult Note: Diagnosis: ICV mass.   Patient was seen by consultant.   Comments: Ms. Lisa Hahn is a pleasant 74 y/o black female with 2.9 cm mass at ileocecal valve & partial small bowel obstruction.  She has ceased vomiting & CT contrast did pass to rectum.  She was supposed to come see [place] to set up colonoscopy but cancelled her appts 6 times since her February referral.  She cancelled colonsocopies set up for Feb 26 & March 11 as well & had rescheduled to May 19, however she presented with pain, nausea & vomiting to ER for admission.  Colonoscopy with biopsies tomorrow with Dr Allen Norris.  Discussed risks/benefits of procedure which include but are not limited to bleeding, infection, perforation & drug reaction.  Patient agrees with this plan & consent will be obtained.  Plan: 1) Slow normal prep.  Will start at noon today. 2) NPO after MN 3) Clear liquids 4) supportive measures 5) Hold heparin Thanks for allowing [place] to participate in her care.  Please see full dictated note. #[id number].  Electronic Signatures: Andria Meuse (NP)  (Signed 20-Apr-15 13:04)  Authored: Brief Consult Note   Last Updated: 20-Apr-15 13:04 by Andria Meuse (NP)

## 2014-07-29 NOTE — Consult Note (Signed)
Chief Complaint:  Subjective/Chief Complaint Pt with HTN, DM now with abd. mass and obstruction. K+ low. Denies cardiac hx.   Brief Assessment:  GEN no acute distress   Cardiac Regular   Respiratory clear BS   Gastrointestinal details normal Soft  Nontender   EXTR negative edema   Lab Results: Routine Chem:  19-Apr-15 12:52   Glucose, Serum 74  BUN 13  Creatinine (comp) 0.74  Sodium, Serum 139  Potassium, Serum  2.7  Chloride, Serum 101  CO2, Serum 32  eGFR (African American) >60  Routine Hem:  19-Apr-15 12:52   WBC (CBC) 7.7  Hemoglobin (CBC) 13.9  Platelet Count (CBC) 230   Radiology Results: CT:    19-Apr-15 17:25, CT Abdomen and Pelvis With Contrast  CT Abdomen and Pelvis With Contrast   REASON FOR EXAM:    (1) diffuse abd pain; (2) diffuse abd pain  COMMENTS:       PROCEDURE: CT  - CT ABDOMEN / PELVIS  W  - Jul 24 2013  5:25PM     CLINICAL DATA:  Abdominal pain    EXAM:  CT ABDOMEN AND PELVIS WITH CONTRAST    TECHNIQUE:  Multidetector CT imaging of the abdomen and pelvis was performed  using the standard protocol following bolus administration of  intravenous contrast.  CONTRAST:  100 cc Isovue-300    COMPARISON:  CT ABD-PELV W/ CM dated 05/22/2013    FINDINGS:  The mass at the ileocecal valve is again noted measuring 2.9 x 2.8 x  2.3 cm. There is an adjacent abnormally enlarged lymph node on image  50 in the cecal mesentery measuring 2.3 x 1.7 cm. This is now  causing obstruction of small bowel with dilated small bowel loops  and decompressed colon. There is some stranding in the adjacent  small bowel mesentery worrisome for edema. Borderline enlarged small  bowel mesenteric lymph nodes are present.    Minimal right and circumflex coronary artery calcifications. Minimal  peripheral aortic valve calcification.  Diffuse hepatic steatosis.    Gallbladder, spleen, pancreas, adrenal glands, and kidneys are  within normal limits.    Normal  appendix.    Sigmoid diverticulosis without acute diverticulitis. Bladder is  unremarkable. Uterus is absent. Adnexa are within normal limits.    No acute bony deformity.     IMPRESSION:  There is now a small bowel obstruction secondary to a mass at the  ileocecal valve.  Electronically Signed    By: Maryclare Bean M.D.    On: 07/24/2013 17:38         Verified By: Jamas Lav, M.D.,   Assessment/Plan:  Assessment/Plan:  Plan Agree with current care including K+ supplementation. Cont. SSI. Hold Lantus while NPO. Cont. Atenolol. F/u labs in AM. Will follow with you.   Electronic Signatures: Idelle Crouch (MD)  (Signed 19-Apr-15 19:45)  Authored: Chief Complaint, Brief Assessment, Lab Results, Radiology Results, Assessment/Plan   Last Updated: 19-Apr-15 19:45 by Idelle Crouch (MD)

## 2014-07-29 NOTE — Consult Note (Signed)
PATIENT NAME:  Lisa Hahn, Lisa Hahn MR#:  [id number] DATE OF BIRTH:  Oct 12, 1940  DATE OF CONSULTATION:  07/24/2013  REFERRING PHYSICIAN:   CONSULTING PHYSICIAN:  Paulla Mcclaskey A. Fredric Slabach, MD  REASON FOR CONSULTATION: Abdominal pain, nausea, vomiting, bloating and right lower quadrant mass, likely obstructing.   HISTORY OF PRESENT ILLNESS: Ms. Lisa Hahn is a pleasant 74 year old female who presented back in February with similar complaints, who presents with a history of hypertension, type 1 diabetes, who presents with 1 week of worsening abdominal pain which is diffuse but centered in her suprapubic region along with multiple episodes of nausea, vomiting and unable to keep anything down.  She was seen back in February for similar and found at that time to have what looked like an ileocecal or possible terminal ileal mass with adjacent lymphadenopathy. She was not obstructed at that time.  On today's CT, it appears that she does have dilated loops of bowel just proximal as well as contrast which has not quite reached this area and collapsed colon which is concerning for progression to obstruction. She also says she has subjective chills for the last 2 days. No fevers, no chest pain, shortness of breath, cough, Last bowel movement was a few days ago  No diarrhea, dysuria or hematuria. No sick contacts. Does have a history of umbilical hernia repair. Last colonoscopy was 2011, is planned to have a colonoscopy with Dr. Allen Norris on April 19th.  I believe she had an appointment earlier but missed that.   PAST MEDICAL HISTORY: 1.  Hypertension.  2.  Diabetes type 1.  3.  Hysterectomy for which she has had complications after a previous procedure.  4.  History of hemorrhoidectomy.  5.  History of CVA.  6.  History of umbilical herniorrhaphy in 2011.  7.  History of knee surgery.  8.  History of foot surgery.   HOME MEDICATIONS: According to chart:  1.  Zofran 1 tab p.o. q.4 hours.  2.  Norco 5 mg 1 tab p.o. q.6 hours  p.r.n. pain.  3.  Metformin 1000 mg p.o. b.i.d.  4.  Lantus 30 mg subcutaneous at bedtime.  5.  Excedrin P.M. p.r.n.  6.  Atenolol 100 mg p.o. daily   ALLERGIES: ARE AS FOLLOWS:  1.  RAMIPRIL.  2.  PERCOCET.  3.  MOTRIN.  4.  ASPIRIN.  5.  PENICILLIN.  6.  AMOXICILLIN.  7.  CIPRO.  8.  LEVAQUIN.  9.  MOXIFLOXACIN.  10.  MACROBID.  11.  ZANTAC.  12.  FERROUS SULFATE. 13.  PHENAZOPYRIDINE.    SOCIAL HISTORY: Here with her son, who is employed in the environmental services. She is retired. Denies tobacco or alcohol use.   FAMILY HISTORY: History of coronary artery disease, diabetes, hypertension, cancers but  nonspecific.   REVIEW OF SYSTEMS: A 12-point  review of systems obtained. Pertinent positives and negatives as above.   PHYSICAL EXAMINATION: VITAL SIGNS: Temperature 98.1, pulse 84, blood pressure 159/87, respirations 18, 96% on room air.  GENERAL: No acute distress. Alert and oriented x 3.  HEAD: Normocephalic, atraumatic.  EYES: No scleral icterus. No conjunctivitis.  FACE: No obvious facial trauma. normal external nose and ears   CHEST: Lungs clear to auscultation. Moving air well.  HEART: Regular rate and rhythm. No murmurs, rubs or gallops.  ABDOMEN:  Soft, moderately distended and diffusely tender, worst at suprapubic but not peritoneal. EXTREMITIES: Moves all extremities well. Strength 5 out of 5.  NEUROLOGIC: Cranial nerves II through XII grossly intact.  LABORATORY, DIAGNOSTIC AND RADIOLOGICAL DATA:    1.  Labs significant for white cell count of 7.7 with 47% neutrophils. Potassium 2.7, creatinine 0.74  2.  Imaging: A CT scan was performed which showed a cecal mass and adjacent large lymph node with a decompressed colon and distended proximal small bowel loops concerning for a small bowel obstruction secondary to mass at ileocecal valve.   ASSESSMENT AND PLAN: Ms. Lisa Hahn is a pleasant 74 year old with what appears to be a small bowel obstruction in her distal  terminal ileum of unknown etiology including but concerning for neoplasm. We will admit for bowel obstruction, obtain gastroenterology consult for colonoscopy if scope is allowed. Otherwise may require surgical intervention and possible ostomy. We will consult medicine for management. Has not thrown up since yesterday so will not place nasogastric tube yet.  Will get an x-ray tomorrow to evaluate if contrast progresses, which may increase the chance of having a successful colonoscopy.   ____________________________ Glena Norfolk. Diogo Anne, MD cal:cs D: 07/24/2013 19:28:58 ET T: 07/24/2013 20:04:29 ET JOB#: [id number]  cc: Harrell Gave A. Josiephine Simao, MD, <Dictator> Floyde Parkins MD ELECTRONICALLY SIGNED 07/25/2013 1:51

## 2014-07-29 NOTE — Consult Note (Signed)
Brief Consult Note: Diagnosis: RLQ mass, infectious vs neoplasic.   Patient was seen by consultant.   Consult note dictated.   Recommend further assessment or treatment.   Comments: medical admission, GI evalution, needs diagnosis prior to any surgical intervention being planned if at all. limited insight into her disease process.  Electronic Signatures: Sherri Rad (MD)  (Signed 16-Feb-15 00:32)  Authored: Brief Consult Note   Last Updated: 16-Feb-15 00:32 by Sherri Rad (MD)

## 2014-07-29 NOTE — Discharge Summary (Signed)
PATIENT NAME:  Lisa Hahn, Lisa Hahn MR#:  [id number] DATE OF BIRTH:  October 23, 1940  DATE OF ADMISSION:  07/24/2013 DATE OF DISCHARGE:  07/26/2013  Please note, that this patient left AGAINST MEDICAL ADVICE prior to completing her diagnostic work-up and therapy.   DIAGNOSES:  Hypertension, diabetes, history of stroke and right colon mass.   PROCEDURES:  None.   CONSULTANTS:  Dr. Allen Norris and Dr. Doy Hutching.   HISTORY OF PRESENT ILLNESS AND HOSPITAL COURSE:  This is a patient who was identified back in January as having a probable right colon mass and was set up to see Dr. Allen Norris or Tamsen Snider, NP in the GI office multiple times.  She had apparently not kept her appointments and/or canceled appointments up to 6 times and has had colonoscopy scheduled and rescheduled multiple times as well.  She was admitted to the hospital with abdominal pain and possible obstructive-like symptoms which resolved while she was in the hospital.  She refused to take the oral bowel prep necessary for colonoscopy.  She verbally fought with her son who tried to get her to undergo a diagnostic work-up and therapy, but ultimately the patient refused any further work-up and demanded to leave.  I was notified of her leaving after she had walked down the hall and left the hospital without any medications, therefore med reconciliation was not performed, but presumably she will restart her medications at home.    Of course, she is welcome to return to our office, to Dr. Dorothey Baseman office or to the Emergency Room.   On multiple occasions, I had discussed with her and her son the need for the work-up and the likelihood that this was some sort of cancer in her right colon causing her intermittent obstructive symptoms and abdominal pain and they were well aware as her son was that this was a very serious condition that required additional work-up and/or therapy which was not completed during this hospitalization as the patient left AGAINST MEDICAL ADVICE.     ____________________________ Jerrol Banana. Burt Knack, MD rec:ea D: 08/10/2013 20:33:00 ET T: 08/11/2013 05:11:30 ET JOB#: [id number]  cc: Jerrol Banana. Burt Knack, MD, <Dictator> Florene Glen MD ELECTRONICALLY SIGNED 08/11/2013 6:50

## 2014-07-29 NOTE — Consult Note (Signed)
Chief Complaint:  Subjective/Chief Complaint Pt states she was not supposed to drink prep last night for exam.  Per RN she refused multiple times although Dr Burt Knack & myself both spoke with her yesterday & she agreed.  She c/o severe 10/10 in her entire abdomen.  c/o nausea without vomiting.   VITAL SIGNS/ANCILLARY NOTES: **Vital Signs.:   21-Apr-15 09:15  Vital Signs Type Pre Medication  Celsius 36.6  Pulse Pulse 82  Respirations Respirations 18  Systolic BP Systolic BP 010  Diastolic BP (mmHg) Diastolic BP (mmHg) 82  Mean BP 107  BP Source  if not from Vital Sign Device manual  Pulse Ox Activity Level  At rest  Oxygen Delivery Room Air/ 21 %   Brief Assessment:  GEN well developed, no acute distress, A/Ox1.   Respiratory normal resp effort   Gastrointestinal details normal Soft  Bowel sounds normal  No rebound tenderness  No gaurding  +moderate distention, +TTP entire abdomen   EXTR negative cyanosis/clubbing, negative edema   Additional Physical Exam Skin: warm, dry   Assessment/Plan:  Assessment/Plan:  Assessment Mass at the ileocecal valve/terminal ileum with partial small bowel obstruction:  SHe has tolerated liquids without further vomiting, but refuses colon prep.  Apparently there is severe underlying psychiatric disorder or dementia that we did not realize as she became very argumentative & noncompliant last night.  Son states she has dementia & wanted to sign her out AMA.  I have discussed with Dr Lucilla Lame & Dr Posey Pronto.   Plan Will need to discuss plans with surgery as she has been noncompliant with prep to see how they want to move forward.  Her psychiatric disorder or dementia may  need to be further addressed with family & may ultimately change her overall management given her expected non-compliance issues. Please call if you have any questions or concerns.   Electronic Signatures for Addendum Section:  Andria Meuse (NP) (Signed Addendum 21-Apr-15 12:18)  Discussed with Dr Burt Knack who will order a Mental Health consult & we will decide after this on how to proceed forward.   Electronic Signatures: Andria Meuse (NP)  (Signed 21-Apr-15 12:17)  Authored: Chief Complaint, VITAL SIGNS/ANCILLARY NOTES, Brief Assessment, Assessment/Plan   Last Updated: 21-Apr-15 12:18 by Andria Meuse (NP)

## 2014-07-31 LAB — SURGICAL PATHOLOGY

## 2014-08-06 NOTE — Op Note (Signed)
PATIENT NAME:  Lisa Hahn, Lisa Hahn MR#:  [id number] DATE OF BIRTH:  1940-07-18  DATE OF PROCEDURE:  04/11/2014  PREOPERATIVE DIAGNOSIS: Partially obstructing tumor of the ileocecal valve.   POSTOPERATIVE DIAGNOSIS: Partially obstructing tumor of the ileocecal valve.   PROCEDURE: LAO colectomy.   SURGEON: Rochel Brome, MD  ANESTHESIA: General.   INDICATIONS: This 74 year old female has a one-year history of abdominal pain and vomiting. She has a history of CT scan demonstrating a tumor at the ileocecal valve. Recent colonoscopy demonstrated some granulation tissue at that site and also a small polyp in the ascending colon. Surgery was recommended for definitive treatment to relieve her pain and vomiting.  DESCRIPTION OF PROCEDURE: The patient was placed on the operating table in the supine position and the anesthesia staff started 2 IVs and administered preop vancomycin and Flagyl. The patient was placed under general endotracheal anesthesia. The circulating nurse inserted a Foley urinary catheter with Betadine preparation of the perineum draining a clear yellow urine. The anesthetist inserted a nasogastric tube and suctioned out some bile-stained gastric fluid, which initially was a small amount. The abdomen was prepared with ChloraPrep and draped in a sterile manner.   A midline incision was made in the abdomen approximately 7 inches in length and carried down through subcutaneous tissues. Electrocautery was used for hemostasis. The midline fascia was incised. The peritoneal cavity was opened. There was marked distention of the small intestine so that the caliber of the largest portion of small intestine was approximately 7 cm. There was also thickening of the wall of the small bowel. The jejunum was minimally distended. There was palpable distention of the stomach, and I had the anesthetist irrigate the nasogastric tube and also push it in further and began to have more bilious drainage. The liver was  with no palpable mass. The incision was lengthened proximally another inch. The tumor could be palpated in the ileocecal valve. The intestinal contents were milked in a proximal direction, milked up into the stomach. This was a somewhat tedious undertaking and with a nasogastric tube aspirated a total of some 2500 mL of bilious fluid. The dissection was subsequently carried out with mobilization of the right colon with incision of the lateral peritoneal reflection using Harmonic scalpel for hemostasis. Also, the right transverse colon was mobilized. The appendix appeared normal. The mass was further palpable at the ileocecal valve. The whole right colon was further mobilized. There was some mass formation and firmness medially adjacent to the mass in the ileocecal valve. Next, the small bowel mesentery was dissected so that a window was created in the mesentery approximately 5 inches proximal to the ileocecal valve, and the dissection was carried out with the Harmonic scalpel. Also, dissection was begun in the mesentery of the transverse colon just to the right of palpable middle colic artery, and the mesenteric dissection was begun with the Harmonic scalpel. The ileocolic artery was ligated with 0 chromic. Another large artery was also ligated with 0 chromic. The mesentery was resected in a V-shaped pattern to include the ileocecal vessels. The small bowel was brought adjacent to the large bowel, at the intended site of anastomosis, and held with Allis clamps. An enterotomy was made and also a colotomy was made, and the anastomosis was begun by inserting the 75 mm GIA stapler into the 2 segments of bowel along the antimesenteric border and engaged the stapler and activated beginning the anastomosis. The staple line was inspected and hemostasis appeared to be intact. Next, the anastomosis  was completed with application of the XB-28 stapler, which was placed perpendicular to the first staple line, engaged and  activated, and the bowel was excised sharply with a scalpel and passed off to a side table to be sent in formalin for routine pathology. The anastomosis was inspected. Several small bleeding points were cauterized. One bleeding point was suture ligated with 5-0 Vicryl. The mesenteric defect was closed with a running 3-0 chromic stitch. The anastomosis was further inspected. It is further noted that the apex of the staple line was imbricated with 5-0 Vicryl, and the junction of the staple lines was imbricated as well.   Seeing hemostasis was intact, the abdomen was irrigated with saline solution and aspirated. Next, the gloves, gown, instruments and towels were exchanged for clean ones. The operative field was further inspected and determined hemostasis was intact. The small amount of omentum was brought partially beneath the wound. The stomach was again palpated and could palpate the nasogastric tube and had the anesthetist further irrigate the nasogastric tube and there appeared to be no additional fluid coming out. The midline fascia was closed with interrupted 0 Maxon figure-of-eight sutures. The subcutaneous tissues were irrigated and determined hemostasis was intact, and the skin was approximated with clips. The dressing was applied using honeycomb dressing.   The patient appeared to be in satisfactory condition. Urine output was 400 mL for the procedure. Nasogastric drainage was 2500 mL for the procedure. The Foley catheter and the nasogastric tubes were removed. The patient was prepared for transfer to the recovery room.  ____________________________ Lenna Sciara. Rochel Brome, MD jws:sb D: 04/11/2014 11:30:15 ET T: 04/11/2014 12:19:54 ET JOB#: [id number]  cc: Loreli Dollar, MD, <Dictator> Loreli Dollar MD ELECTRONICALLY SIGNED 04/13/2014 11:31

## 2014-08-06 NOTE — Discharge Summary (Signed)
PATIENT NAME:  Lisa Hahn, Lisa Hahn MR#:  [id number] DATE OF BIRTH:  [birth year]  DATE OF ADMISSION:  04/11/2014 DATE OF DISCHARGE:  04/19/2014  HISTORY OF PRESENT ILLNESS: This 74 year old female had a chief complaint of abdominal pain. She also reported a 1-year history of abdominal pain, bloating and vomiting. She had had a CT scan in April, which demonstrated a tumor at the ileocecal valve. Also, a more recent CT scan again demonstrated this tumor. She was referred by Dr. Ma Hillock for further evaluation and surgery.   PAST MEDICAL HISTORY: Diabetes, hypertension, stroke, hypercholesterolemia, osteoarthritis.   Other details are recorded on the typed H and P.   Recent colonoscopy demonstrated an area of granular mucosa found at the ileocecal valve. There was also a 20 mm polyp found in the ascending colon. The patient was admitted to the hospital for elective surgery and had ileal colectomy She did have intraoperative findings of marked distention of the small bowel and did have 2.5 L of fluid evacuated from the small bowel via nasogastric suctioning. She did receive a preoperative dose of intravenous antibiotics as prophylaxis. Following surgery, she received IV fluids, analgesics and prophylactic subcutaneous heparin. She did have a period of disorientation, which appeared to be related to narcotic analgesics, and was eventually able to progress to just taking Tylenol. She did begin a clear liquid diet and was advanced to full liquids and ultimately to solid food. We did keep her in the hospital for a period of observation until she had satisfactory bowel movements.   Final pathology demonstrated a well differentiated neuroendocrine tumor, grade 1, consistent with carcinoid tumor involving the ileocecal valve. There was metastasis found in 16 out of 19 regional mesenteric lymph nodes. Margins were clear.   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   At the time of  discharge, her wounds were progressing satisfactorily and plans were given for discharge management. Plans were made for followup in the office, and also anticipate followup oncology evaluation.    ____________________________ Lenna Sciara. Rochel Brome, MD jws:MT D: 04/28/2014 09:40:40 ET T: 04/28/2014 10:19:56 ET JOB#: [id number]  cc: Loreli Dollar, MD, <Dictator> Loreli Dollar MD ELECTRONICALLY SIGNED 05/02/2014 18:34

## 2014-12-23 ENCOUNTER — Emergency Department
Admission: EM | Admit: 2014-12-23 | Discharge: 2014-12-23 | Disposition: A | Discharge status: Home / Self Care | Payer: Medicare Other | Attending: Emergency Medicine | Admitting: Emergency Medicine

## 2014-12-23 ENCOUNTER — Emergency Department: Payer: Medicare Other

## 2014-12-23 ENCOUNTER — Encounter: Payer: Self-pay | Admitting: Emergency Medicine

## 2014-12-23 DIAGNOSIS — I10 Essential (primary) hypertension: Secondary | ICD-10-CM | POA: Insufficient documentation

## 2014-12-23 DIAGNOSIS — Y9389 Activity, other specified: Secondary | ICD-10-CM | POA: Diagnosis not present

## 2014-12-23 DIAGNOSIS — Y9289 Other specified places as the place of occurrence of the external cause: Secondary | ICD-10-CM | POA: Insufficient documentation

## 2014-12-23 DIAGNOSIS — Z79899 Other long term (current) drug therapy: Secondary | ICD-10-CM | POA: Diagnosis not present

## 2014-12-23 DIAGNOSIS — Z794 Long term (current) use of insulin: Secondary | ICD-10-CM | POA: Diagnosis not present

## 2014-12-23 DIAGNOSIS — Y998 Other external cause status: Secondary | ICD-10-CM | POA: Diagnosis not present

## 2014-12-23 DIAGNOSIS — W19XXXA Unspecified fall, initial encounter: Secondary | ICD-10-CM

## 2014-12-23 DIAGNOSIS — K5900 Constipation, unspecified: Secondary | ICD-10-CM | POA: Diagnosis not present

## 2014-12-23 DIAGNOSIS — S3991XA Unspecified injury of abdomen, initial encounter: Secondary | ICD-10-CM | POA: Insufficient documentation

## 2014-12-23 DIAGNOSIS — W1839XA Other fall on same level, initial encounter: Secondary | ICD-10-CM | POA: Insufficient documentation

## 2014-12-23 DIAGNOSIS — E119 Type 2 diabetes mellitus without complications: Secondary | ICD-10-CM | POA: Insufficient documentation

## 2014-12-23 DIAGNOSIS — Z87891 Personal history of nicotine dependence: Secondary | ICD-10-CM | POA: Insufficient documentation

## 2014-12-23 DIAGNOSIS — R1011 Right upper quadrant pain: Secondary | ICD-10-CM

## 2014-12-23 DIAGNOSIS — R1084 Generalized abdominal pain: Secondary | ICD-10-CM | POA: Diagnosis not present

## 2014-12-23 DIAGNOSIS — K8020 Calculus of gallbladder without cholecystitis without obstruction: Secondary | ICD-10-CM | POA: Diagnosis not present

## 2014-12-23 LAB — COMPREHENSIVE METABOLIC PANEL
ALT: 10 U/L — AB (ref 14–54)
AST: 23 U/L (ref 15–41)
Albumin: 4.1 g/dL (ref 3.5–5.0)
Alkaline Phosphatase: 56 U/L (ref 38–126)
Anion gap: 8 (ref 5–15)
BUN: 15 mg/dL (ref 6–20)
CALCIUM: 9.2 mg/dL (ref 8.9–10.3)
CHLORIDE: 108 mmol/L (ref 101–111)
CO2: 25 mmol/L (ref 22–32)
CREATININE: 0.65 mg/dL (ref 0.44–1.00)
Glucose, Bld: 120 mg/dL — ABNORMAL HIGH (ref 65–99)
Potassium: 3.1 mmol/L — ABNORMAL LOW (ref 3.5–5.1)
Sodium: 141 mmol/L (ref 135–145)
Total Bilirubin: 0.4 mg/dL (ref 0.3–1.2)
Total Protein: 7 g/dL (ref 6.5–8.1)

## 2014-12-23 LAB — CBC
HCT: 39.6 % (ref 35.0–47.0)
Hemoglobin: 13.2 g/dL (ref 12.0–16.0)
MCH: 29.7 pg (ref 26.0–34.0)
MCHC: 33.4 g/dL (ref 32.0–36.0)
MCV: 88.9 fL (ref 80.0–100.0)
PLATELETS: 215 10*3/uL (ref 150–440)
RBC: 4.45 MIL/uL (ref 3.80–5.20)
RDW: 15.6 % — ABNORMAL HIGH (ref 11.5–14.5)
WBC: 7.1 10*3/uL (ref 3.6–11.0)

## 2014-12-23 LAB — LIPASE, BLOOD: LIPASE: 61 U/L — AB (ref 22–51)

## 2014-12-23 NOTE — ED Provider Notes (Signed)
[facility] Emergency Department Provider Note  ____________________________________________  Time seen: 1040  I have reviewed the triage vital signs and the nursing notes.   HISTORY  Chief Complaint Abdominal Pain and Fall     HPI Lisa Hahn is a 74 y.o. female who presents to the emergency department with her son due to a complaint of abdominal pain.  The patient explains to me that she had 2 falls one month ago. She initially fell on the right abdomen and then later fell on the left abdomen. Initially she tells me that she's had abdominal pain since this fall area she denies loss of consciousness when she had the fall. She is not able to tell me why she fell, except she reports it was due to old age. No falls since.  She reports normal bowel function without diarrhea. She reports sometimes she feels a little nauseous, but not now, and she's had no vomiting.  The patient appears to have some memory limitations. The son reports that she complained of some abdominal pain this morning.  The patient did have an obstruction 3-4 months ago (records show this was December 2015)  and underwent a resection by Dr. Rochel Brome.   Past Medical History  Diagnosis Date  . Hypertension   . Diabetes mellitus without complication     There are no active problems to display for this patient.   Past Surgical History  Procedure Laterality Date  . Abdominal surgery      blockage   . Abdominal hysterectomy      Current Outpatient Rx  Name  Route  Sig  Dispense  Refill  . acetaminophen-codeine (TYLENOL #3) 300-30 MG per tablet   Oral   Take 1-2 tablets by mouth every 6 (six) hours as needed for pain.   15 tablet   0   . atenolol (TENORMIN) 100 MG tablet   Oral   Take 100 mg by mouth daily.         . diphenhydramine-acetaminophen (TYLENOL PM) 25-500 MG TABS   Oral   Take 2 tablets by mouth at bedtime as needed (sleep/pain).         . insulin  glargine (LANTUS) 100 UNIT/ML injection   Subcutaneous   Inject 30 Units into the skin daily. Morning or afternoon.         . sulfamethoxazole-trimethoprim (BACTRIM DS,SEPTRA DS) 800-160 MG per tablet   Oral   Take 2 tablets by mouth 2 (two) times daily. One po bid x 7 days   28 tablet   0     Allergies Aspirin and Motrin  No family history on file.  Social History Social History  Substance Use Topics  . Smoking status: Former Research scientist (life sciences)  . Smokeless tobacco: None  . Alcohol Use: No    Review of Systems  Constitutional: Negative for fever. ENT: Negative for sore throat. Cardiovascular: Negative for chest pain. Respiratory: Negative for shortness of breath. Gastrointestinal: Report of intermittent abdominal pain, some nausea. No vomiting or diarrhea. Genitourinary: Negative for dysuria. Musculoskeletal: No myalgias or injuries. Skin: Negative for rash. Neurological: Negative for headaches Psychological: Son reports some memory issues  10-point ROS otherwise negative.  ____________________________________________   PHYSICAL EXAM:  VITAL SIGNS: ED Triage Vitals  Enc Vitals Group     BP 12/23/14 0942 136/67 mmHg     Pulse Rate 12/23/14 0942 59     Resp 12/23/14 0942 18     Temp 12/23/14 0942 98 F (36.7 C)  Temp Source 12/23/14 0942 Oral     SpO2 12/23/14 0942 99 %     Weight 12/23/14 0942 149 lb 1 oz (67.614 kg)     Height 12/23/14 0942 5\' 2"  (1.575 m)     Head Cir --      Peak Flow --      Pain Score 12/23/14 0948 3     Pain Loc --      Pain Edu? --      Excl. in [place]? --     Constitutional:  Alert, interactive, conversant. Well appearing and in no distress. ENT   Head: Normocephalic and atraumatic.   Nose: No congestion/rhinnorhea.   Mouth/Throat: Mucous membranes are moist. Cardiovascular: Normal rate, regular rhythm, no murmur noted Respiratory:  Normal respiratory effort, no tachypnea.    Breath sounds are clear and equal bilaterally.   Gastrointestinal: Soft and nontender. No distention. There is a midline scar present and well-healed. Back: No muscle spasm, no tenderness, no CVA tenderness. Musculoskeletal: No deformity noted. Nontender with normal range of motion in all extremities.  No noted edema. Neurologic:  Normal speech and language. No gross focal neurologic deficits are appreciated.  Skin:  Skin is warm, dry. No rash noted. Psychiatric: Pleasant, gregarious, 74 year old female. She is slightly repetitive and appears to have slight difficulty reporting the timeframe for her ailment.  ____________________________________________    LABS (pertinent positives/negatives)  Labs Reviewed  LIPASE, BLOOD - Abnormal; Notable for the following:    Lipase 61 (*)    All other components within normal limits  COMPREHENSIVE METABOLIC PANEL - Abnormal; Notable for the following:    Potassium 3.1 (*)    Glucose, Bld 120 (*)    ALT 10 (*)    All other components within normal limits  CBC - Abnormal; Notable for the following:    RDW 15.6 (*)    All other components within normal limits  URINALYSIS COMPLETEWITH MICROSCOPIC ([facility] ONLY)     ____________________________________________   EKG  ED ECG REPORT I, KAMINSKI,DAVID W, the attending physician, personally viewed and interpreted this ECG.   Date: 12/23/2014  EKG Time: 950  Rate: 61  Rhythm: Normal sinus rhythm  Axis: Normal  Intervals: Normal  ST&T Change: None noted   ____________________________________________    RADIOLOGY  Ultrasound right upper quadrant: IMPRESSION: Cholelithiasis without sonographic evidence of acute cholecystitis.    X-ray abdomen:  IMPRESSION: Constipation. No acute cardiopulmonary process.  ____________________________________________   ____________________________________________   INITIAL IMPRESSION / ASSESSMENT AND PLAN / ED COURSE  Pertinent labs & imaging results that were available during my care of  the patient were reviewed by me and considered in my medical decision making (see chart for details).  This 74 year old female looks well and is pleasant and in no acute distress. Her abdomen is benign. She would appear ready for discharge, but I have a concern due to the slight elevation in her lipase level. Looking back at prior test, she had a slightly higher level in December when she had the obstructive process. We will obtain an ultrasound to look at the biliary tree and what we can of the pancreas and we will order an x-ray of the belly to rule out obstruction.  ----------------------------------------- 12:39 PM on 12/23/2014 -----------------------------------------  Ultrasound shows gallstones but without any evidence of acute cholecystitis. Negative Murphy sign. No biliary dilatation.  X-ray shows constipation but no other acute process.  Patient continues to look well. We will discharge her home with follow-up with Dr.  Rochel Brome. I've advised the patient's son and the patient to call to make an appointment with Dr. Tamala Julian for follow-up.  ____________________________________________   FINAL CLINICAL IMPRESSION(S) / ED DIAGNOSES  Final diagnoses:  Abdominal pain, right upper quadrant  Fall, initial encounter  Generalized abdominal pain      Ahmed Prima, MD 12/23/14 1242

## 2014-12-23 NOTE — Discharge Instructions (Signed)
Your belly exam was good. No tenderness, no distention. Your blood tests are overall good, but you had a slight elevation in your lipase level. You have had this before. Due to this we did get an ultrasound which showed gallstones but without any sign of blockage or infection of the gallbladder. An x-ray of your belly was normal and did not show any obstruction. Follow-up with Dr. Rochel Brome later this coming week. Return to the emergency department if you have worsening abdominal pain or other urgent concerns.  Abdominal Pain Many things can cause belly (abdominal) pain. Most times, the belly pain is not dangerous. Many cases of belly pain can be watched and treated at home. HOME CARE   Do not take medicines that help you go poop (laxatives) unless told to by your doctor.  Only take medicine as told by your doctor.  Eat or drink as told by your doctor. Your doctor will tell you if you should be on a special diet. GET HELP IF:  You do not know what is causing your belly pain.  You have belly pain while you are sick to your stomach (nauseous) or have runny poop (diarrhea).  You have pain while you pee or poop.  Your belly pain wakes you up at night.  You have belly pain that gets worse or better when you eat.  You have belly pain that gets worse when you eat fatty foods.  You have a fever. GET HELP RIGHT AWAY IF:   The pain does not go away within 2 hours.  You keep throwing up (vomiting).  The pain changes and is only in the right or left part of the belly.  You have bloody or tarry looking poop. MAKE SURE YOU:   Understand these instructions.  Will watch your condition.  Will get help right away if you are not doing well or get worse. Document Released: 09/10/2007 Document Revised: 03/29/2013 Document Reviewed: 12/01/2012 [facility] Patient Information 2015 [place], [place]. This information is not intended to replace advice given to you by your health care provider. Make  sure you discuss any questions you have with your health care provider.

## 2014-12-23 NOTE — ED Notes (Signed)
AAOx3.  Skin warm and dry.  Ambulates with easy and steady gait. NAD 

## 2014-12-23 NOTE — ED Notes (Signed)
Pt states she is having abd pain, denies vomiting/diarrhea, states she has been falling a lot recently. Has diabetes. Denies dizziness, thinks the pain in her stomach is from the falls.

## 2014-12-24 MED ORDER — NICOTINE 21 MG/24HR TD PT24
MEDICATED_PATCH | TRANSDERMAL | Status: AC
  Filled 2014-12-24: qty 1

## 2015-02-18 ENCOUNTER — Emergency Department: Payer: Medicare Other

## 2015-02-18 ENCOUNTER — Emergency Department
Admission: EM | Admit: 2015-02-18 | Discharge: 2015-02-18 | Disposition: A | Discharge status: Home / Self Care | Payer: Medicare Other | Attending: Emergency Medicine | Admitting: Emergency Medicine

## 2015-02-18 ENCOUNTER — Other Ambulatory Visit: Payer: Self-pay

## 2015-02-18 ENCOUNTER — Encounter: Payer: Self-pay | Admitting: Emergency Medicine

## 2015-02-18 DIAGNOSIS — Y9389 Activity, other specified: Secondary | ICD-10-CM | POA: Insufficient documentation

## 2015-02-18 DIAGNOSIS — I10 Essential (primary) hypertension: Secondary | ICD-10-CM | POA: Insufficient documentation

## 2015-02-18 DIAGNOSIS — Z87891 Personal history of nicotine dependence: Secondary | ICD-10-CM | POA: Insufficient documentation

## 2015-02-18 DIAGNOSIS — S299XXA Unspecified injury of thorax, initial encounter: Secondary | ICD-10-CM | POA: Diagnosis present

## 2015-02-18 DIAGNOSIS — Y998 Other external cause status: Secondary | ICD-10-CM | POA: Diagnosis not present

## 2015-02-18 DIAGNOSIS — Y9289 Other specified places as the place of occurrence of the external cause: Secondary | ICD-10-CM | POA: Diagnosis not present

## 2015-02-18 DIAGNOSIS — S46911A Strain of unspecified muscle, fascia and tendon at shoulder and upper arm level, right arm, initial encounter: Secondary | ICD-10-CM | POA: Diagnosis not present

## 2015-02-18 DIAGNOSIS — Z794 Long term (current) use of insulin: Secondary | ICD-10-CM | POA: Insufficient documentation

## 2015-02-18 DIAGNOSIS — M546 Pain in thoracic spine: Secondary | ICD-10-CM | POA: Diagnosis not present

## 2015-02-18 DIAGNOSIS — X58XXXA Exposure to other specified factors, initial encounter: Secondary | ICD-10-CM | POA: Diagnosis not present

## 2015-02-18 DIAGNOSIS — S29012A Strain of muscle and tendon of back wall of thorax, initial encounter: Secondary | ICD-10-CM | POA: Diagnosis not present

## 2015-02-18 DIAGNOSIS — E119 Type 2 diabetes mellitus without complications: Secondary | ICD-10-CM | POA: Insufficient documentation

## 2015-02-18 DIAGNOSIS — S46811A Strain of other muscles, fascia and tendons at shoulder and upper arm level, right arm, initial encounter: Secondary | ICD-10-CM

## 2015-02-18 DIAGNOSIS — Z79899 Other long term (current) drug therapy: Secondary | ICD-10-CM | POA: Insufficient documentation

## 2015-02-18 MED ORDER — HYDROCODONE-ACETAMINOPHEN 5-325 MG PO TABS
1.0000 | ORAL_TABLET | Freq: Once | ORAL | Status: AC
  Administered 2015-02-18: 1 via ORAL
  Filled 2015-02-18: qty 1

## 2015-02-18 MED ORDER — MELOXICAM 15 MG PO TABS: 15.0000 mg | ORAL_TABLET | Freq: Every day | ORAL | Status: DC

## 2015-02-18 MED ORDER — LIDOCAINE HCL (PF) 1 % IJ SOLN: 5.0000 mL | Freq: Once | INTRAMUSCULAR | Status: DC

## 2015-02-18 MED ORDER — TRAMADOL HCL 50 MG PO TABS: 50.0000 mg | ORAL_TABLET | Freq: Four times a day (QID) | ORAL | Status: DC | PRN

## 2015-02-18 MED ORDER — LIDOCAINE HCL (PF) 1 % IJ SOLN
INTRAMUSCULAR | Status: AC
  Filled 2015-02-18: qty 5

## 2015-02-18 NOTE — ED Provider Notes (Signed)
[facility] Emergency Department Provider Note  ____________________________________________  Time seen: Approximately 12:56 PM  I have reviewed the triage vital signs and the nursing notes.   HISTORY  Chief Complaint Back Pain   HPI Lisa Hahn is a 74 y.o. female who presents to the emergency department for evaluation of right upper back pain. She denies injury or increase in activity. No relief with tylenol or BC Powder. No cough or fever.   Past Medical History  Diagnosis Date  . Hypertension   . Diabetes mellitus without complication ([place])     There are no active problems to display for this patient.   Past Surgical History  Procedure Laterality Date  . Abdominal surgery      blockage   . Abdominal hysterectomy      Current Outpatient Rx  Name  Route  Sig  Dispense  Refill  . acetaminophen-codeine (TYLENOL #3) 300-30 MG per tablet   Oral   Take 1-2 tablets by mouth every 6 (six) hours as needed for pain.   15 tablet   0   . atenolol (TENORMIN) 100 MG tablet   Oral   Take 100 mg by mouth daily.         . diphenhydramine-acetaminophen (TYLENOL PM) 25-500 MG TABS   Oral   Take 2 tablets by mouth at bedtime as needed (sleep/pain).         . insulin glargine (LANTUS) 100 UNIT/ML injection   Subcutaneous   Inject 30 Units into the skin daily. Morning or afternoon.         . meloxicam (MOBIC) 15 MG tablet   Oral   Take 1 tablet (15 mg total) by mouth daily.   30 tablet   2   . sulfamethoxazole-trimethoprim (BACTRIM DS,SEPTRA DS) 800-160 MG per tablet   Oral   Take 2 tablets by mouth 2 (two) times daily. One po bid x 7 days   28 tablet   0   . traMADol (ULTRAM) 50 MG tablet   Oral   Take 1 tablet (50 mg total) by mouth every 6 (six) hours as needed.   9 tablet   0     Allergies Aspirin and Motrin  No family history on file.  Social History Social History  Substance Use Topics  . Smoking status: Former Research scientist (life sciences)   . Smokeless tobacco: None  . Alcohol Use: No    Review of Systems Constitutional: No fever/chills Eyes: No visual changes. ENT: No sore throat. Cardiovascular: Denies chest pain. Respiratory: No shortness of breath. No cough. Gastrointestinal: Negative for abdominal pain. No nausea,  No vomiting.  No diarrhea.  Genitourinary: Negative for dysuria. Musculoskeletal: Negative for body aches Skin: Negative for rash. Neurological: Negative for headaches, Negative for focal weakness or numbness.  10-point ROS otherwise negative.  ____________________________________________   PHYSICAL EXAM:  VITAL SIGNS: ED Triage Vitals  Enc Vitals Group     BP 02/18/15 1131 181/82 mmHg     Pulse Rate 02/18/15 1131 78     Resp 02/18/15 1131 20     Temp 02/18/15 1131 98 F (36.7 C)     Temp src --      SpO2 02/18/15 1131 96 %     Weight 02/18/15 1131 125 lb (56.7 kg)     Height 02/18/15 1131 5\' 2"  (1.575 m)     Head Cir --      Peak Flow --      Pain Score 02/18/15 1208 10  Pain Loc --      Pain Edu? --      Excl. in [place]? --     Constitutional: Alert and oriented. Well appearing and in no acute distress. Eyes: Conjunctivae are normal. PERRL. EOMI. Head: Atraumatic. Nose: No congestion/rhinnorhea. Mouth/Throat: Mucous membranes are moist. Neck: No stridor.  Cardiovascular:   Good peripheral circulation. Respiratory: Normal respiratory effort.  Musculoskeletal: No joint pain reported. Full ROM of right arm and neck. Neurologic:  Normal speech and language. No gross focal neurologic deficits are appreciated. Speech is normal. No gait instability. Skin:  Skin is warm, dry and intact. No rash noted. Mild edema into the right fourth digit Psychiatric: Mood and affect are normal. Speech and behavior are normal.  ____________________________________________   LABS (all labs ordered are listed, but only abnormal results are displayed)  Labs Reviewed - No data to  display ____________________________________________  EKG   ____________________________________________  RADIOLOGY  Not indicated ____________________________________________   PROCEDURES  Procedure(s) performed: None  Critical Care performed: No  ____________________________________________   INITIAL IMPRESSION / ASSESSMENT AND PLAN / ED COURSE  Pertinent labs & imaging results that were available during my care of the patient were reviewed by me and considered in my medical decision making (see chart for details).  Patient was advised to follow up with the primary care provider for symptoms that are not improving over the next few days. She was advised to return to the emergency department for symptoms that change or worsen if unable to schedule an appointment.  ____________________________________________   FINAL CLINICAL IMPRESSION(S) / ED DIAGNOSES  Final diagnoses:  Trapezius strain, right, initial encounter       Victorino Dike, FNP 02/18/15 1433  Lavonia Drafts, MD 02/18/15 1445

## 2015-02-18 NOTE — Discharge Instructions (Signed)

## 2015-02-18 NOTE — ED Provider Notes (Signed)
ED ECG REPORT I, Lavonia Drafts, the attending physician, personally viewed and interpreted this ECG.  Date: 02/18/2015 EKG Time: 11:29 AM Rate: 79 Rhythm: normal sinus rhythm QRS Axis: normal Intervals: normal ST/T Wave abnormalities: Nonspecific Conduction Disutrbances: none Narrative Interpretation: unremarkable   Lavonia Drafts, MD 02/18/15 1338

## 2015-02-18 NOTE — ED Notes (Signed)
States she developed pain to mid back under right shoulder blade for for about 1 week

## 2015-06-28 ENCOUNTER — Ambulatory Visit: Payer: Medicare Other | Admitting: Family Medicine

## 2015-06-28 ENCOUNTER — Encounter: Payer: Self-pay | Admitting: Family Medicine

## 2015-06-28 ENCOUNTER — Ambulatory Visit
Admission: RE | Admit: 2015-06-28 | Discharge: 2015-06-28 | Disposition: A | Discharge status: Home / Self Care | Payer: Medicare Other | Source: Ambulatory Visit | Attending: Family Medicine | Admitting: Family Medicine

## 2015-06-28 ENCOUNTER — Ambulatory Visit (INDEPENDENT_AMBULATORY_CARE_PROVIDER_SITE_OTHER): Payer: Medicare Other | Admitting: Family Medicine

## 2015-06-28 VITALS — BP 126/72 | HR 86 | Temp 97.9°F | Ht 61.0 in | Wt 172.6 lb

## 2015-06-28 DIAGNOSIS — R002 Palpitations: Secondary | ICD-10-CM | POA: Diagnosis not present

## 2015-06-28 DIAGNOSIS — M7989 Other specified soft tissue disorders: Secondary | ICD-10-CM | POA: Diagnosis not present

## 2015-06-28 DIAGNOSIS — R079 Chest pain, unspecified: Secondary | ICD-10-CM | POA: Insufficient documentation

## 2015-06-28 DIAGNOSIS — E119 Type 2 diabetes mellitus without complications: Secondary | ICD-10-CM | POA: Diagnosis not present

## 2015-06-28 DIAGNOSIS — M25562 Pain in left knee: Secondary | ICD-10-CM | POA: Insufficient documentation

## 2015-06-28 DIAGNOSIS — M238X2 Other internal derangements of left knee: Secondary | ICD-10-CM | POA: Diagnosis not present

## 2015-06-28 DIAGNOSIS — H539 Unspecified visual disturbance: Secondary | ICD-10-CM

## 2015-06-28 DIAGNOSIS — Z8639 Personal history of other endocrine, nutritional and metabolic disease: Secondary | ICD-10-CM

## 2015-06-28 DIAGNOSIS — S76112A Strain of left quadriceps muscle, fascia and tendon, initial encounter: Secondary | ICD-10-CM | POA: Insufficient documentation

## 2015-06-28 LAB — CBC
HCT: 40.6 % (ref 36.0–46.0)
HEMOGLOBIN: 13.5 g/dL (ref 12.0–15.0)
MCHC: 33.2 g/dL (ref 30.0–36.0)
MCV: 87.9 fl (ref 78.0–100.0)
Platelets: 207 10*3/uL (ref 150.0–400.0)
RBC: 4.62 Mil/uL (ref 3.87–5.11)
RDW: 15.4 % (ref 11.5–15.5)
WBC: 7.8 10*3/uL (ref 4.0–10.5)

## 2015-06-28 LAB — COMPREHENSIVE METABOLIC PANEL
ALT: 10 U/L (ref 0–35)
AST: 16 U/L (ref 0–37)
Albumin: 4 g/dL (ref 3.5–5.2)
Alkaline Phosphatase: 75 U/L (ref 39–117)
BILIRUBIN TOTAL: 0.5 mg/dL (ref 0.2–1.2)
BUN: 12 mg/dL (ref 6–23)
CO2: 27 meq/L (ref 19–32)
CREATININE: 0.66 mg/dL (ref 0.40–1.20)
Calcium: 9.2 mg/dL (ref 8.4–10.5)
Chloride: 102 mEq/L (ref 96–112)
GFR: 112.22 mL/min (ref >60.00)
GLUCOSE: 280 mg/dL — AB (ref 70–99)
Potassium: 3.4 mEq/L — ABNORMAL LOW (ref 3.5–5.1)
Sodium: 137 mEq/L (ref 135–145)
TOTAL PROTEIN: 7.4 g/dL (ref 6.0–8.3)

## 2015-06-28 LAB — TSH: TSH: 0.74 u[IU]/mL (ref 0.35–4.50)

## 2015-06-28 LAB — HEMOGLOBIN A1C: HEMOGLOBIN A1C: 8.8 % — AB (ref 4.6–6.5)

## 2015-06-28 NOTE — Assessment & Plan Note (Signed)
Patient overall has a relatively benign exam with just mild tenderness of her joint line in the left knee. No signs of infection. Suspect likely arthritis or meniscal damage as cause of her discomfort. We will obtain an x-ray of her left knee to evaluate this further. Depending on the results we may refer her to orthopedic surgery. We will await the results of her x-ray prior to determining pain control. Given return precautions.

## 2015-06-28 NOTE — Patient Instructions (Signed)
Nice to meet you. We're going to have ECG her cardiologist for your chest pain. We have an x-ray her left knee. We will obtain some lab work as well. If you develop any chest pain, shortness of breath, palpitations, sweating, vision changes, or any new or changing symptoms please go to the emergency room immediately.

## 2015-06-28 NOTE — Progress Notes (Signed)
Pre visit review using our clinic review tool, if applicable. No additional management support is needed unless otherwise documented below in the visit note. 

## 2015-06-28 NOTE — Progress Notes (Signed)
Patient ID: Lisa Hahn, female   DOB: 12-Dec-1940, 75 y.o.   MRN: [id number]  Tommi Rumps, MD Phone: [phone]  Lisa Hahn is a 75 y.o. female who presents today for new patient visit.  Chest pain/shortness of breath: Patient notes for a number of months she has had central sharp chest pain and some central chest pressure most days. Notes she gets short of breath with exertion. Notes the chest pain occurs with exertion. She has some diaphoresis with this. No radiation. Improves with rest. Some palpitations as well. No orthopnea or PND or lower extremity edema. No history of blood clot. She's previously been evaluated by cardiology for preoperative exam though did not have any of these symptoms at that time. No chest pain or shortness of breath or palpitations at this time.  Left knee pain: Patient notes for a number of months she has had pain in her left knee superior to the patella and along the joint line. Notes it occasionally becomes swollen. Occasionally there'll be some warmth to it. No fevers or redness. She notes it has given out on her before. She has fallen on it as well. Takes Tylenol as she cannot take NSAIDs.   Patient had a history of weight loss, though her weight is now up 50 or so pounds. She notes her weight was going downhill when she was on insulin though she has stopped taking her insulin as she was advised that she did not need it previously. 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  She additionally reports steady stable vision changes specifically with trouble reading up close. She has not seen an eye doctor recently.  Active Ambulatory Problems    Diagnosis Date Noted  . Exertional chest pain 06/28/2015  . Left knee pain 06/28/2015  . History of diabetes mellitus 06/28/2015  . Vision changes 06/28/2015   Resolved Ambulatory Problems    Diagnosis Date Noted  . No Resolved Ambulatory Problems   Past Medical History  Diagnosis Date  .  Hypertension   . Diabetes mellitus without complication ([place])   . Arthritis     Family History  Problem Relation Age of Onset  . Arthritis      Parent    Social History   Social History  . Marital Status: Single    Spouse Name: N/A  . Number of Children: N/A  . Years of Education: N/A   Occupational History  . Not on file.   Social History Main Topics  . Smoking status: Former Research scientist (life sciences)  . Smokeless tobacco: Not on file  . Alcohol Use: No  . Drug Use: No  . Sexual Activity: Not on file   Other Topics Concern  . Not on file   Social History Narrative    ROS   General:  Negative for nexplained weight loss, fever Skin: Negative for new or changing mole, sore that won't heal HEENT: Positive for trouble hearing, trouble seeing, ringing in ears, negative for mouth sores, hoarseness, change in voice, dysphagia. CV:  Positive for chest pain, dyspnea, palpitations, negative for edema Resp: Negative for cough, dyspnea, hemoptysis GI: Positive for constipation, Negative for nausea, vomiting, diarrhea, abdominal pain, melena, hematochezia. GU: Negative for dysuria, incontinence, urinary hesitance, hematuria, vaginal or penile discharge, polyuria, sexual difficulty, lumps in testicle or breasts MSK: Positive for left knee pain, Negative for muscle cramps or aches, joint pain or swelling Neuro: Negative for headaches, weakness, numbness, dizziness, passing out/fainting Psych: Negative for depression, anxiety, memory problems  Objective  Physical Exam Filed Vitals:   06/28/15 1253  BP: 126/72  Pulse: 86  Temp: 97.9 F (36.6 C)    BP Readings from Last 3 Encounters:  06/28/15 126/72  02/18/15 181/82  12/23/14 142/78   Wt Readings from Last 3 Encounters:  06/28/15 172 lb 9.6 oz (78.291 kg)  02/18/15 125 lb (56.7 kg)  12/23/14 149 lb 1 oz (67.614 kg)    Physical Exam  Constitutional: She is well-developed, well-nourished, and in no distress.  HENT:  Head:  Normocephalic and atraumatic.  Right Ear: External ear normal.  Left Ear: External ear normal.  Mouth/Throat: Oropharynx is clear and moist. No oropharyngeal exudate.  Eyes: Conjunctivae are normal. Pupils are equal, round, and reactive to light.  Neck: Neck supple.  Cardiovascular: Normal rate, regular rhythm and normal heart sounds.  Exam reveals no gallop and no friction rub.   No murmur heard. Pulmonary/Chest: Effort normal and breath sounds normal. No respiratory distress. She has no wheezes. She has no rales.  Musculoskeletal:  Left knee with no swelling or warmth or erythema, there is mild tenderness just superior to the left patella, there is mild tenderness of bilateral joint lines on the left side, no ligament laxity, negative McMurray's, right knee with no swelling, warmth, or erythema, there is no tenderness of the right knee, no ligament laxity, negative McMurray's No calf tenderness or cords palpated  Lymphadenopathy:    She has no cervical adenopathy.  Neurological: She is alert. Gait normal.  Skin: Skin is warm and dry. She is not diaphoretic.   EKG: Normal sinus rhythm, rate 77, inverted T waves in leads 1, 2, aVL, V2 through V6, inverted T waves are new in leads V3 through V6 and leads 1, 2, and aVL from her most recent EKG, these T-wave inversions have previously been seen on prior EKGs.  Assessment/Plan:   Exertional chest pain Patient with increasing frequency of exertional chest pain and exertional shortness of breath. She is asymptomatic at this time. Sounds typical for angina. EKG obtained today reveals T wave inversions in the anterolateral leads. Unlikely pulmonary cause. Doubt blood clot in patient with stable vital signs and lack of history of VTE. Doubt CHF is contributing cause given lack of typical CHF symptoms. We'll obtain lab work as outlined below. We will refer her back to cardiology for urgent evaluation as patient has not had symptoms in the last 24 hours  and does not need emergent evaluation. If she develops symptoms again she was advised that she needed to go to the emergency room via EMS. She's given return precautions.  Left knee pain Patient overall has a relatively benign exam with just mild tenderness of her joint line in the left knee. No signs of infection. Suspect likely arthritis or meniscal damage as cause of her discomfort. We will obtain an x-ray of her left knee to evaluate this further. Depending on the results we may refer her to orthopedic surgery. We will await the results of her x-ray prior to determining pain control. Given return precautions.  History of diabetes mellitus Reports history of diabetes. We'll check an A1c today.  Vision changes Advised patient to see a optometrist or ophthalmologist for vision check.    Orders Placed This Encounter  Procedures  . DG Knee Complete 4 Views Left    Standing Status: Future     Number of Occurrences: 1     Standing Expiration Date: 08/27/2016    Order Specific Question:  Reason for  Exam (SYMPTOM  OR DIAGNOSIS REQUIRED)    Answer:  Left knee pain, knee giving out on patient-please complete standing films    Order Specific Question:  Preferred imaging location?    Answer:  [place] ([facility])  . TSH  . HgB A1c  . CBC  . EKG 12-Lead    Tommi Rumps, MD [place]

## 2015-06-28 NOTE — Assessment & Plan Note (Signed)
Reports history of diabetes. We'll check an A1c today.

## 2015-06-28 NOTE — Assessment & Plan Note (Signed)
Advised patient to see a optometrist or ophthalmologist for vision check.

## 2015-06-28 NOTE — Assessment & Plan Note (Addendum)
Patient with increasing frequency of exertional chest pain and exertional shortness of breath. She is asymptomatic at this time. Sounds typical for angina. EKG obtained today reveals T wave inversions in the anterolateral leads. Unlikely pulmonary cause. Doubt blood clot in patient with stable vital signs and lack of history of VTE. Doubt CHF is contributing cause given lack of typical CHF symptoms. We'll obtain lab work as outlined below. We will refer her back to cardiology for urgent evaluation as patient has not had symptoms in the last 24 hours and does not need emergent evaluation. If she develops symptoms again she was advised that she needed to go to the emergency room via EMS. She's given return precautions.

## 2015-07-02 DIAGNOSIS — I10 Essential (primary) hypertension: Secondary | ICD-10-CM | POA: Diagnosis not present

## 2015-07-02 DIAGNOSIS — I208 Other forms of angina pectoris: Secondary | ICD-10-CM | POA: Diagnosis not present

## 2015-07-02 DIAGNOSIS — E782 Mixed hyperlipidemia: Secondary | ICD-10-CM | POA: Diagnosis not present

## 2015-07-21 ENCOUNTER — Encounter: Payer: Self-pay | Admitting: Emergency Medicine

## 2015-07-21 ENCOUNTER — Emergency Department: Payer: Medicare Other

## 2015-07-21 ENCOUNTER — Emergency Department
Admission: EM | Admit: 2015-07-21 | Discharge: 2015-07-21 | Disposition: A | Discharge status: Home / Self Care | Payer: Medicare Other | Attending: Emergency Medicine | Admitting: Emergency Medicine

## 2015-07-21 DIAGNOSIS — M1990 Unspecified osteoarthritis, unspecified site: Secondary | ICD-10-CM | POA: Diagnosis not present

## 2015-07-21 DIAGNOSIS — E119 Type 2 diabetes mellitus without complications: Secondary | ICD-10-CM | POA: Insufficient documentation

## 2015-07-21 DIAGNOSIS — Z794 Long term (current) use of insulin: Secondary | ICD-10-CM | POA: Diagnosis not present

## 2015-07-21 DIAGNOSIS — I10 Essential (primary) hypertension: Secondary | ICD-10-CM | POA: Diagnosis not present

## 2015-07-21 DIAGNOSIS — Z87891 Personal history of nicotine dependence: Secondary | ICD-10-CM | POA: Diagnosis not present

## 2015-07-21 DIAGNOSIS — Z8673 Personal history of transient ischemic attack (TIA), and cerebral infarction without residual deficits: Secondary | ICD-10-CM | POA: Insufficient documentation

## 2015-07-21 DIAGNOSIS — R140 Abdominal distension (gaseous): Secondary | ICD-10-CM | POA: Diagnosis not present

## 2015-07-21 DIAGNOSIS — R4182 Altered mental status, unspecified: Secondary | ICD-10-CM | POA: Diagnosis present

## 2015-07-21 DIAGNOSIS — Z90710 Acquired absence of both cervix and uterus: Secondary | ICD-10-CM | POA: Diagnosis not present

## 2015-07-21 DIAGNOSIS — F0390 Unspecified dementia without behavioral disturbance: Secondary | ICD-10-CM | POA: Insufficient documentation

## 2015-07-21 HISTORY — DX: Cerebral infarction, unspecified: I63.9

## 2015-07-21 LAB — URINALYSIS COMPLETE WITH MICROSCOPIC (ARMC ONLY)
Bacteria, UA: NONE SEEN
Bilirubin Urine: NEGATIVE
Glucose, UA: >500 mg/dL — AB
HGB URINE DIPSTICK: NEGATIVE
Leukocytes, UA: NEGATIVE
NITRITE: NEGATIVE
PH: 5 (ref 5.0–8.0)
PROTEIN: NEGATIVE mg/dL
SPECIFIC GRAVITY, URINE: 1.035 — AB (ref 1.005–1.030)

## 2015-07-21 LAB — COMPREHENSIVE METABOLIC PANEL
ALBUMIN: 4.3 g/dL (ref 3.5–5.0)
ALT: 11 U/L — ABNORMAL LOW (ref 14–54)
ANION GAP: 7 (ref 5–15)
AST: 20 U/L (ref 15–41)
Alkaline Phosphatase: 73 U/L (ref 38–126)
BILIRUBIN TOTAL: 0.7 mg/dL (ref 0.3–1.2)
BUN: 18 mg/dL (ref 6–20)
CHLORIDE: 106 mmol/L (ref 101–111)
CO2: 24 mmol/L (ref 22–32)
Calcium: 9.4 mg/dL (ref 8.9–10.3)
Creatinine, Ser: 0.54 mg/dL (ref 0.44–1.00)
GFR calc Af Amer: >60 mL/min (ref >60)
GFR calc non Af Amer: >60 mL/min (ref >60)
GLUCOSE: 219 mg/dL — AB (ref 65–99)
POTASSIUM: 3.4 mmol/L — AB (ref 3.5–5.1)
SODIUM: 137 mmol/L (ref 135–145)
TOTAL PROTEIN: 7.5 g/dL (ref 6.5–8.1)

## 2015-07-21 LAB — CBC
HCT: 43.4 % (ref 35.0–47.0)
Hemoglobin: 14.4 g/dL (ref 12.0–16.0)
MCH: 29.8 pg (ref 26.0–34.0)
MCHC: 33.2 g/dL (ref 32.0–36.0)
MCV: 89.9 fL (ref 80.0–100.0)
PLATELETS: 182 10*3/uL (ref 150–440)
RBC: 4.82 MIL/uL (ref 3.80–5.20)
RDW: 15 % — AB (ref 11.5–14.5)
WBC: 7 10*3/uL (ref 3.6–11.0)

## 2015-07-21 NOTE — ED Notes (Signed)
Pt straight cathed for urine, which looks clear. Spoke with dr Clearnce Hasten and ct of head ordered.

## 2015-07-21 NOTE — Discharge Instructions (Signed)

## 2015-07-21 NOTE — ED Notes (Signed)
Per son pt having memory problems and wandering outside since her stroke.  This is a recheck of the pt - pt states she is here for gall stones. A & ox 2.  Vss. No deficeits in mobility. States trouble with incontinence and has not given a urine sample.

## 2015-07-21 NOTE — ED Provider Notes (Signed)
[facility] Emergency Department Provider Note  ____________________________________________  Time seen: Approximately 910 PM  I have reviewed the triage vital signs and the nursing notes.   HISTORY  Chief Complaint Altered Mental Status   HPI Lisa Hahn is a 75 y.o. female with a history of diabetes, dementia and stroke was presenting to the emergency department today with worsening memory issues and now wandering from home. She is here with her son who says that her confusion and wandering habits have worsened. He says that he has had to switch his work shifts in order to be home to supervise his mother. She is not on any medication for dementia. The patient is complaining of abdominal pain today which has been intermittent. However, the son says that she has had surgery in the past and has not been complaining of abdominal pain lately. He says that he found her today at a nearby home after being called by the residents of that home to come pick her up. The patient has also had intermittent headaches but is not claiming any headaches at this time. No focal weakness at this time. The patient is alert and oriented to herself, the location as well as her birthdate but does not know what year it is.   The patient says that she does not wish to go into a nursing home.  Past Medical History  Diagnosis Date  . Hypertension   . Diabetes mellitus without complication ([place])   . Arthritis   . Stroke [facility])     Patient Active Problem List   Diagnosis Date Noted  . Exertional chest pain 06/28/2015  . Left knee pain 06/28/2015  . History of diabetes mellitus 06/28/2015  . Vision changes 06/28/2015    Past Surgical History  Procedure Laterality Date  . Abdominal surgery      blockage   . Abdominal hysterectomy      Current Outpatient Rx  Name  Route  Sig  Dispense  Refill  . insulin glargine (LANTUS) 100 UNIT/ML injection   Subcutaneous   Inject 30 Units into  the skin daily. Reported on 06/28/2015           Allergies Aspirin and Motrin  Family History  Problem Relation Age of Onset  . Arthritis      Parent    Social History Social History  Substance Use Topics  . Smoking status: Former Research scientist (life sciences)  . Smokeless tobacco: None  . Alcohol Use: No    Review of Systems Constitutional: No fever/chills Eyes: No visual changes. ENT: No sore throat. Cardiovascular: Denies chest pain. Respiratory: Denies shortness of breath. Gastrointestinal: No nausea, no vomiting.  No diarrhea.  No constipation. Genitourinary: Negative for dysuria. Musculoskeletal: Negative for back pain. Skin: Negative for rash. Neurological: Negative for, focal weakness or numbness.  10-point ROS otherwise negative.  ____________________________________________   PHYSICAL EXAM:  VITAL SIGNS: ED Triage Vitals  Enc Vitals Group     BP 07/21/15 1548 161/99 mmHg     Pulse Rate 07/21/15 1548 104     Resp 07/21/15 1548 16     Temp 07/21/15 1548 98.3 F (36.8 C)     Temp Source 07/21/15 1548 Oral     SpO2 07/21/15 1548 93 %     Weight 07/21/15 1548 140 lb (63.504 kg)     Height 07/21/15 1548 5\' 2"  (1.575 m)     Head Cir --      Peak Flow --      Pain  Score 07/21/15 1548 0     Pain Loc --      Pain Edu? --      Excl. in [place]? --     Constitutional: Alert and oriented. Well appearing and in no acute distress. Eyes: Conjunctivae are normal. PERRL. EOMI. Head: Atraumatic. Nose: No congestion/rhinnorhea. Mouth/Throat: Mucous membranes are moist.  Oropharynx non-erythematous. Neck: No stridor.   Cardiovascular: Normal rate, regular rhythm. Grossly normal heart sounds.   Respiratory: Normal respiratory effort.  No retractions. Lungs CTAB. Gastrointestinal: Soft and nontender. No distention. No abdominal bruits. No CVA tenderness. Musculoskeletal: No lower extremity tenderness nor edema.  No joint effusions. Neurologic:  Normal speech and language. No gross focal  neurologic deficits are appreciated. Skin:  Skin is warm, dry and intact. No rash noted. Psychiatric: Mood and affect are normal. Speech and behavior are normal.  ____________________________________________   LABS (all labs ordered are listed, but only abnormal results are displayed)  Labs Reviewed  URINALYSIS COMPLETEWITH MICROSCOPIC ([facility] ONLY) - Abnormal; Notable for the following:    Color, Urine YELLOW (*)    APPearance CLEAR (*)    Glucose, UA >500 (*)    Ketones, ur TRACE (*)    Specific Gravity, Urine 1.035 (*)    Squamous Epithelial / LPF 0-5 (*)    All other components within normal limits  CBC - Abnormal; Notable for the following:    RDW 15.0 (*)    All other components within normal limits  COMPREHENSIVE METABOLIC PANEL - Abnormal; Notable for the following:    Potassium 3.4 (*)    Glucose, Bld 219 (*)    ALT 11 (*)    All other components within normal limits   ____________________________________________  EKG  ED ECG REPORT I, Doran Stabler, the attending physician, personally viewed and interpreted this ECG.   Date: 07/21/2015  EKG Time: 1601  Rate: 95  Rhythm: normal sinus rhythm  Axis: Normal axis  Intervals:none  ST&T Change: No ST segment elevation or depression. T-wave inversions in V3 through V6 which is unchanged from previous EKGs.  ____________________________________________  RADIOLOGY   ____________________________________________   PROCEDURES  ____________________________________________   INITIAL IMPRESSION / ASSESSMENT AND PLAN / ED COURSE  Pertinent labs & imaging results that were available during my care of the patient were reviewed by me and considered in my medical decision making (see chart for details).  Patient is oriented and aware to her situation but does have memory issues. The son says that these memory issues have been worsening but have been doing so over a lengthy period of time. I do not see any acute  issue on her workup at this time. The son will be following up with the patient's primary care doctor this coming Monday. I said that there are likely medications that need to be given such as Aricept but that this should be done in the office and should be tract is now patient. The son is understanding of this plan and willing to comply. Will be discharged home.  ____________________________________________   FINAL CLINICAL IMPRESSION(S) / ED DIAGNOSES  Progression of dementia.    Orbie Pyo, MD 07/21/15 2136

## 2015-07-21 NOTE — ED Notes (Addendum)
Patient presents with her son today.  Son states she wanted [facility], and kept thinking she has another apartment.  She has a history of wandering outside and confusion where she has called the police.  Patient states she has a headache every day-sharp pain around her forehead and temple. No headache presently.  Son states the patient repeats her words.  Patient does live alone. Patient is alert and oriented to person and place, but is disoriented to time.  Has a hx of a "mini" stroke in 2009. Patient has a hx of a fall 2 weeks ago. Son reports confusion has progressed slowly.  Son states she locked herself out of the house today and went to a "trap" house where one of the ladies there knew her son and called him.  Patient was unaware where she was.

## 2015-07-21 NOTE — ED Notes (Signed)
This is a redraw per request from lab due to not enough blood in tubes on the first draw.

## 2015-07-24 ENCOUNTER — Ambulatory Visit (INDEPENDENT_AMBULATORY_CARE_PROVIDER_SITE_OTHER): Payer: Medicare Other | Admitting: Family Medicine

## 2015-07-24 VITALS — BP 130/88 | HR 85 | Temp 97.6°F | Ht 61.0 in | Wt 167.0 lb

## 2015-07-24 DIAGNOSIS — R413 Other amnesia: Secondary | ICD-10-CM | POA: Diagnosis not present

## 2015-07-24 DIAGNOSIS — Z7289 Other problems related to lifestyle: Secondary | ICD-10-CM | POA: Diagnosis not present

## 2015-07-24 DIAGNOSIS — F0390 Unspecified dementia without behavioral disturbance: Secondary | ICD-10-CM

## 2015-07-24 DIAGNOSIS — Z113 Encounter for screening for infections with a predominantly sexual mode of transmission: Secondary | ICD-10-CM | POA: Diagnosis not present

## 2015-07-24 DIAGNOSIS — R079 Chest pain, unspecified: Secondary | ICD-10-CM

## 2015-07-24 LAB — VITAMIN B12: Vitamin B-12: 231 pg/mL (ref 211–911)

## 2015-07-24 NOTE — Patient Instructions (Signed)
Nice to see you. We will obtain further lab work to evaluate her dementia. Once this returns we'll consider starting on medication. Please view the handout that has been provided for behavioral modifications for wandering. If you develop chest pain, shortness of breath, worsening memory loss, decreased level of arousal, or any or change in symptoms to seek medical attention.

## 2015-07-24 NOTE — Progress Notes (Signed)
Pre visit review using our clinic review tool, if applicable. No additional management support is needed unless otherwise documented below in the visit note. 

## 2015-07-25 ENCOUNTER — Encounter: Payer: Self-pay | Admitting: Family Medicine

## 2015-07-25 DIAGNOSIS — F0390 Unspecified dementia without behavioral disturbance: Secondary | ICD-10-CM | POA: Insufficient documentation

## 2015-07-25 LAB — HIV ANTIBODY (ROUTINE TESTING W REFLEX): HIV 1&2 Ab, 4th Generation: NONREACTIVE

## 2015-07-25 NOTE — Assessment & Plan Note (Signed)
Patient reports no recurrence. She has been evaluated by cardiology. She'll continue to monitor. If recurs she will seek medical attention. Given return precautions.

## 2015-07-25 NOTE — Progress Notes (Signed)
Patient ID: Lisa Hahn, female   DOB: 11/15/1940, 75 y.o.   MRN: [id number]  Lisa Rumps, MD Phone: [phone]  Lisa Hahn is a 75 y.o. female who presents today for follow-up.  Dementia: Patient presents with her son. Son notes he had to take her to the emergency room as she was found wandering and found that a trap house. Notes she was checked out and felt to be fine for discharge. She had a CT scan of which the report has been reviewed. She had lab work that was unremarkable for cause of her memory issues. Patient's son notes that this is not the first time it has happened. She has gotten lost and could not find her way back home previously. Notes her memory is not what it used to be. Her son was scared because he didn't know where she was. He feels as though he has to take off work to be able to take care of her so that she does not wander. Patient notes she is able to use the restroom by herself and dress herself. She does not cook or manage her finances. Her son does manage her medications. She is oriented to person and place though not time.  Chest pain: Patient notes no recurrence of her chest pain. No shortness of breath. She was evaluated by cardiology who appears recommended stress testing though she was unsure if she wanted to do this. She is going to follow-up with them in 4 months.  PMH: Former smoker   ROS see history of present illness  Objective  Physical Exam Filed Vitals:   07/24/15 0859  BP: 130/88  Pulse: 85  Temp: 97.6 F (36.4 C)    Physical Exam  Constitutional: She is well-developed, well-nourished, and in no distress.  HENT:  Head: Normocephalic and atraumatic.  Mouth/Throat: Oropharynx is clear and moist. No oropharyngeal exudate.  Eyes: Conjunctivae are normal. Pupils are equal, round, and reactive to light.  Cardiovascular: Normal rate, regular rhythm and normal heart sounds.   Pulmonary/Chest: Effort normal and breath sounds normal.    Neurological: She is alert.  CN 2-12 intact, 5/5 strength in bilateral biceps, triceps, grip, quads, hamstrings, plantar and dorsiflexion, sensation to light touch intact in bilateral UE and LE, normal gait, 2+ patellar reflexes  Skin: Skin is warm and dry. She is not diaphoretic.   failed mini-cognitive with no words remembered and unable to draw clock   Assessment/Plan: Please see individual problem list.  Exertional chest pain Patient reports no recurrence. She has been evaluated by cardiology. She'll continue to monitor. If recurs she will seek medical attention. Given return precautions.  Dementia Patient with memory deficits consistent with dementia. This is a worsening chronic issue. Failed mini cog test. CT scan does reveal ischemic changes. Prior lab work did not reveal any thyroid dysfunction. 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 We will check a B12 and HIV to complete the workup. I gave them a handout on wandering behavior and recommended placing stop signs on exits to see if this would be beneficial. I also discussed having her go to a nursing home for skilled help though they declined this. We will place a home health social work referral to see what resources are available. Would consider starting on Aricept if B12 and HIV are negative. They will continue to monitor. Given return precautions.    Orders Placed This Encounter  Procedures  . B12  . HIV antibody (with reflex)  . Ambulatory referral  to Home Health    Referral Priority:  Routine    Referral Type:  Home Health Care    Referral Reason:  Specialty Services Required    Requested Specialty:  [place]    Number of Visits Requested:  [place], MD [place]

## 2015-07-25 NOTE — Assessment & Plan Note (Addendum)
Patient with memory deficits consistent with dementia. This is a worsening chronic issue. Failed mini cog test. CT scan does reveal ischemic changes. Prior lab work did not reveal any thyroid dysfunction. 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 We will check a B12 and HIV to complete the workup. I gave them a handout on wandering behavior and recommended placing stop signs on exits to see if this would be beneficial. I also discussed having her go to a nursing home for skilled help though they declined this. We will place a home health social work referral to see what resources are available. Would consider starting on Aricept if B12 and HIV are negative. They will continue to monitor. Given return precautions.

## 2015-07-30 ENCOUNTER — Ambulatory Visit (INDEPENDENT_AMBULATORY_CARE_PROVIDER_SITE_OTHER): Payer: Medicare Other | Admitting: Family Medicine

## 2015-07-30 ENCOUNTER — Telehealth: Payer: Self-pay | Admitting: *Deleted

## 2015-07-30 ENCOUNTER — Encounter: Payer: Self-pay | Admitting: Family Medicine

## 2015-07-30 VITALS — BP 134/88 | HR 74 | Temp 97.7°F | Ht 61.0 in | Wt 168.6 lb

## 2015-07-30 DIAGNOSIS — F0390 Unspecified dementia without behavioral disturbance: Secondary | ICD-10-CM | POA: Diagnosis not present

## 2015-07-30 DIAGNOSIS — E119 Type 2 diabetes mellitus without complications: Secondary | ICD-10-CM

## 2015-07-30 DIAGNOSIS — S76112D Strain of left quadriceps muscle, fascia and tendon, subsequent encounter: Secondary | ICD-10-CM | POA: Diagnosis not present

## 2015-07-30 MED ORDER — DONEPEZIL HCL 5 MG PO TABS: 5.0000 mg | ORAL_TABLET | Freq: Every day | ORAL | Status: DC

## 2015-07-30 MED ORDER — ATORVASTATIN CALCIUM 40 MG PO TABS: 40.0000 mg | ORAL_TABLET | Freq: Every day | ORAL | Status: DC

## 2015-07-30 MED ORDER — ACETAMINOPHEN-CODEINE #3 300-30 MG PO TABS: 1.0000 | ORAL_TABLET | Freq: Four times a day (QID) | ORAL | Status: DC | PRN

## 2015-07-30 MED ORDER — METFORMIN HCL 500 MG PO TABS: 500.0000 mg | ORAL_TABLET | Freq: Two times a day (BID) | ORAL | Status: DC

## 2015-07-30 NOTE — Assessment & Plan Note (Addendum)
Stable from last visit. Discussed treating with Aricept. We will start on 5 mg daily. They will monitor further. Social work is coming out to her house tomorrow to discuss any resources. We will fill out paperwork for FMLA for the patient's son.

## 2015-07-30 NOTE — Assessment & Plan Note (Signed)
Area of discomfort is actually noted to be in the left quadriceps tendon today. No knee pain or abnormalities of the knee on exam. She does have some point tenderness over the left quadriceps tendon. I discussed options and advise that injection of her knee would likely not help this issue. Given her history of reflux with NSAIDs discussed avoiding this type of medication. Discussed having her see sports medicine though she opted to hold off on this. We will treat with Tylenol with codeine for pain. Advised that this can make her drowsy and they need to be wary of this. She will continue to monitor. She's given return precautions.

## 2015-07-30 NOTE — Telephone Encounter (Signed)
Wasn't able to get pt, labs. She couldn't use the bathroom and said she would bring it back sometime this week with her son

## 2015-07-30 NOTE — Telephone Encounter (Signed)
Noted  

## 2015-07-30 NOTE — Patient Instructions (Signed)
Nice to see you. We are going to start her on metformin for diabetes. We are also going to start you on a cholesterol medicine called Lipitor. We are going to start you on Aricept as well. We will treat your knee pain with Tylenol with Codeine. Be wary as this can make you drowsy. If you develop any drowsiness or confusion on this please let us know. If you develop worsening knee pain, fever, swelling, worsening memory issues, or any new or changing symptoms please seek medical attention.

## 2015-07-30 NOTE — Progress Notes (Signed)
Pre visit review using our clinic review tool, if applicable. No additional management support is needed unless otherwise documented below in the visit note. 

## 2015-07-30 NOTE — Telephone Encounter (Signed)
FYI

## 2015-07-30 NOTE — Assessment & Plan Note (Signed)
Patient's last A1c elevated. She now reports she has not been on medication for almost a year. Her renal function will tolerate metformin and thus we will start her back on this. Given that she has evidently been off of insulin for over a year will hold off on restarting this. We will start her on a statin for cardiovascular protection as well. She'll continue to take her baby aspirin. We will check urine microalbumin today. We'll check a lipid panel today as well.

## 2015-07-30 NOTE — Progress Notes (Signed)
Patient ID: [place] Lorden, female   DOB: February 18, 1941, 75 y.o.   MRN: [id number]  Tommi Rumps, MD Phone: [phone]  Kiira Domino is a 75 y.o. female who presents today for follow-up.  Left knee pain: Patient notes left knee has been bothering her for a long time. When she points to where her discomfort as she points proximal to her knee in the area of her quadriceps tendon. She notes no actual knee pain. No knee swelling. She is taking ibuprofen intermittent leak for this.  Dementia: Patient has undergone workup for this. 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 Other workup has been negative. Notes issues with memory and wandering. Please see prior note for full documentation. I discussed Aricept with them at their last visit and they are willing to try this.  DIABETES Disease Monitoring: Blood Sugar ranges-not checking Polyuria/phagia/dipsia- no      Has not been on medications for at least a year. Last A1c 8.8.  PMH: Former smoker   ROS see history of present illness  Objective  Physical Exam Filed Vitals:   07/30/15 1046  BP: 134/88  Pulse: 74  Temp: 97.7 F (36.5 C)    BP Readings from Last 3 Encounters:  07/30/15 134/88  07/24/15 130/88  07/21/15 148/101   Wt Readings from Last 3 Encounters:  07/30/15 168 lb 9.6 oz (76.476 kg)  07/24/15 167 lb (75.751 kg)  07/21/15 140 lb (63.504 kg)    Physical Exam  Constitutional: She is well-developed, well-nourished, and in no distress.  HENT:  Head: Normocephalic and atraumatic.  Cardiovascular: Normal rate, regular rhythm and normal heart sounds.   Pulmonary/Chest: Effort normal and breath sounds normal.  Musculoskeletal:  Left knee with no joint line tenderness, no joint swelling, no ligamentous laxity, negative McMurray's, left quadriceps tendon just prior to insertion in the patella with mild tenderness to palpation, right knee with no joint line tenderness, joint line swelling, or ligamentous laxity,  negative McMurray's, no quadriceps tendon tenderness  Neurological: She is alert. Gait normal.  CN 2-12 intact, 5/5 strength in bilateral biceps, triceps, grip, quads, hamstrings, plantar and dorsiflexion, sensation to light touch intact in bilateral UE and LE, normal gait, 2+ patellar reflexes Does ask the same questions several times throughout the exam  Skin: Skin is warm and dry. She is not diaphoretic.     Assessment/Plan: Please see individual problem list.  Dementia Stable from last visit. Discussed treating with Aricept. We will start on 5 mg daily. They will monitor further. Social work is coming out to her house tomorrow to discuss any resources. We will fill out paperwork for FMLA for the patient's son.  Strain of left quadriceps tendon Area of discomfort is actually noted to be in the left quadriceps tendon today. No knee pain or abnormalities of the knee on exam. She does have some point tenderness over the left quadriceps tendon. I discussed options and advise that injection of her knee would likely not help this issue. Given her history of reflux with NSAIDs discussed avoiding this type of medication. Discussed having her see sports medicine though she opted to hold off on this. We will treat with Tylenol with codeine for pain. Advised that this can make her drowsy and they need to be wary of this. She will continue to monitor. She's given return precautions.  Type 2 diabetes mellitus ([place]) Patient's last A1c elevated. She now reports she has not been on medication for almost a year. Her renal function will tolerate metformin  and thus we will start her back on this. Given that she has evidently been off of insulin for over a year will hold off on restarting this. We will start her on a statin for cardiovascular protection as well. She'll continue to take her baby aspirin. We will check urine microalbumin today. We'll check a lipid panel today as well.    Orders Placed This  Encounter  Procedures  . Urine Microalbumin w/creat. ratio  . Lipid Profile    Meds ordered this encounter  Medications  . donepezil (ARICEPT) 5 MG tablet    Sig: Take 1 tablet (5 mg total) by mouth at bedtime.    Dispense:  30 tablet    Refill:  2  . metFORMIN (GLUCOPHAGE) 500 MG tablet    Sig: Take 1 tablet (500 mg total) by mouth 2 (two) times daily with a meal.    Dispense:  180 tablet    Refill:  3  . atorvastatin (LIPITOR) 40 MG tablet    Sig: Take 1 tablet (40 mg total) by mouth daily.    Dispense:  90 tablet    Refill:  3  . acetaminophen-codeine (TYLENOL #3) 300-30 MG tablet    Sig: Take 1 tablet by mouth every 6 (six) hours as needed for moderate pain.    Dispense:  30 tablet    Refill:  0    Tommi Rumps, MD [place]

## 2015-07-31 DIAGNOSIS — R079 Chest pain, unspecified: Secondary | ICD-10-CM | POA: Diagnosis not present

## 2015-07-31 DIAGNOSIS — F0390 Unspecified dementia without behavioral disturbance: Secondary | ICD-10-CM | POA: Diagnosis not present

## 2015-07-31 DIAGNOSIS — Z7689 Persons encountering health services in other specified circumstances: Secondary | ICD-10-CM

## 2015-08-03 DIAGNOSIS — F0390 Unspecified dementia without behavioral disturbance: Secondary | ICD-10-CM | POA: Diagnosis not present

## 2015-08-03 DIAGNOSIS — R079 Chest pain, unspecified: Secondary | ICD-10-CM | POA: Diagnosis not present

## 2015-08-06 ENCOUNTER — Telehealth: Payer: Self-pay | Admitting: Family Medicine

## 2015-08-06 NOTE — Telephone Encounter (Signed)
Noted. Previously discussed with patient and son. Will continue to discuss at future visits.

## 2015-08-06 NOTE — Telephone Encounter (Signed)
Please advise 

## 2015-08-06 NOTE — Telephone Encounter (Signed)
Quinn Axe [id number] called from [facility] care regarding pt being seen on Friday and then providing Dementia care education. Son was quite distraught  when he found out that it is not curable. Thank you!

## 2015-08-10 DIAGNOSIS — F0390 Unspecified dementia without behavioral disturbance: Secondary | ICD-10-CM | POA: Diagnosis not present

## 2015-08-10 DIAGNOSIS — R079 Chest pain, unspecified: Secondary | ICD-10-CM | POA: Diagnosis not present

## 2015-08-10 NOTE — Telephone Encounter (Signed)
Pt son would not allow [facility] to see his mother this week.Lisa Hahn from [place] [facility] is wanting to see if Dr. Caryl Bis would see about her doing OT and Pt.. please advise Lisa Hahn at [phone].Marland Kitchen

## 2015-08-10 NOTE — Telephone Encounter (Signed)
Please advise 

## 2015-08-13 DIAGNOSIS — F0390 Unspecified dementia without behavioral disturbance: Secondary | ICD-10-CM | POA: Diagnosis not present

## 2015-08-13 DIAGNOSIS — R079 Chest pain, unspecified: Secondary | ICD-10-CM | POA: Diagnosis not present

## 2015-08-14 ENCOUNTER — Other Ambulatory Visit: Payer: Self-pay | Admitting: Family Medicine

## 2015-08-14 ENCOUNTER — Ambulatory Visit: Payer: Medicare Other | Admitting: Family Medicine

## 2015-08-14 ENCOUNTER — Telehealth: Payer: Self-pay | Admitting: Surgical

## 2015-08-14 MED ORDER — ACETAMINOPHEN-CODEINE #3 300-30 MG PO TABS: 1.0000 | ORAL_TABLET | Freq: Four times a day (QID) | ORAL | Status: DC | PRN

## 2015-08-14 NOTE — Telephone Encounter (Signed)
Gave verbal to [place] at [facility] for PT and OT

## 2015-08-14 NOTE — Telephone Encounter (Signed)
Agree with 10 minute late policy. I have previously discussed the visit issue with the patient and her son and advised that given the location of her pain a knee injection would not likely be beneficial. Will discuss this with them if they arrive with in the late window.

## 2015-08-14 NOTE — Telephone Encounter (Signed)
Tried to call patients son back to let him know that it is a 10 minute late policy. HE was told 15 minutes when he called to say he was going to be late. I did not get an answer.

## 2015-08-14 NOTE — Telephone Encounter (Signed)
Verbal order can be given for PT and OT.

## 2015-08-15 ENCOUNTER — Encounter: Payer: Self-pay | Admitting: Family Medicine

## 2015-08-15 ENCOUNTER — Ambulatory Visit (INDEPENDENT_AMBULATORY_CARE_PROVIDER_SITE_OTHER): Payer: Medicare Other | Admitting: Family Medicine

## 2015-08-15 VITALS — BP 118/66 | HR 92 | Temp 98.8°F | Ht 61.0 in | Wt 169.4 lb

## 2015-08-15 DIAGNOSIS — E119 Type 2 diabetes mellitus without complications: Secondary | ICD-10-CM

## 2015-08-15 DIAGNOSIS — G8929 Other chronic pain: Secondary | ICD-10-CM

## 2015-08-15 DIAGNOSIS — R42 Dizziness and giddiness: Secondary | ICD-10-CM | POA: Diagnosis not present

## 2015-08-15 DIAGNOSIS — R1013 Epigastric pain: Secondary | ICD-10-CM

## 2015-08-15 DIAGNOSIS — M769 Unspecified enthesopathy, lower limb, excluding foot: Secondary | ICD-10-CM | POA: Diagnosis not present

## 2015-08-15 DIAGNOSIS — S76112D Strain of left quadriceps muscle, fascia and tendon, subsequent encounter: Secondary | ICD-10-CM

## 2015-08-15 DIAGNOSIS — M76899 Other specified enthesopathies of unspecified lower limb, excluding foot: Secondary | ICD-10-CM

## 2015-08-15 MED ORDER — ACETAMINOPHEN-CODEINE #3 300-30 MG PO TABS: 1.0000 | ORAL_TABLET | Freq: Four times a day (QID) | ORAL | Status: DC | PRN

## 2015-08-15 NOTE — Assessment & Plan Note (Signed)
I again discussed that her discomfort is in the left quadriceps tendon or muscle. She will not benefit from an injection in her knee and she was advised that the risks of the injection would likely outweigh the benefits. Given her persistent issues with this we will refer her to sports medicine for further evaluation. I have refilled her Tylenol with Codeine as this is been beneficial. Given return precautions.

## 2015-08-15 NOTE — Progress Notes (Signed)
Pre visit review using our clinic review tool, if applicable. No additional management support is needed unless otherwise documented below in the visit note. 

## 2015-08-15 NOTE — Assessment & Plan Note (Signed)
This has been a chronic intermittent issue since she had her prior bowel surgery. Benign exam today. No signs of obstruction with good bowel sounds and daily bowel movements. Nothing else on history or exam to direct to a cause. We will check a CMP, lipase, and CBC to evaluate further. Given chronicity if continues would consider imaging of abdomen. Given return precautions.

## 2015-08-15 NOTE — Progress Notes (Signed)
Patient ID: Lisa Hahn, female   DOB: 1941/03/31, 75 y.o.   MRN: [id number]  Lisa Rumps, MD Phone: [phone]  Lisa Hahn is a 75 y.o. female who presents today for follow-up.  Left quadriceps discomfort: Patient notes this is unchanged. There is discomfort in the same spot. It is not in the knee joint. States it feels like there might be a lump there. It just hurts. She notes the Tylenol with Codeine as been beneficial. She would like an injection in her knee.   Patient notes she occasionally gets lightheaded when standing. Notes it typically only occurs with change in position. Notes it is not vertigo. She notes no chest pain or shortness of breath with this. She notes this has been of recent onset since she started on Aricept.  Patient notes some epigastric abdominal discomfort that is intermittent and sharp since she had her previous surgery on her abdomen. Notes it has been going on for many months. Does not radiate anywhere. No bowel or bladder issues. No diarrhea, nausea, or vomiting. No constipation. Has a bowel movement every day. No blood in her stool. She is status post hysterectomy. She has no pain at this time.  Cardiovascular protection with statin therapy Chest pain on exertion:  No   shortness of breath: No Medications: Compliance- taking Lipitor Right upper quadrant pain- no, though does note epigastric discomfort  Muscle aches- Notes chronic muscle aches that have not worsened since being on Lipitor  DIABETES Disease Monitoring: Blood Sugar ranges-not checking Polyuria/phagia/dipsia- no      Medications: Compliance- taking metformin Hypoglycemic symptoms- no    PMH: Former smoker   ROS see history of present illness  Objective  Physical Exam Filed Vitals:   08/15/15 1556  BP: 118/66  Pulse: 92  Temp: 98.8 F (37.1 C)    BP Readings from Last 3 Encounters:  08/15/15 118/66  07/30/15 134/88  07/24/15 130/88   Wt Readings from Last 3 Encounters:    08/15/15 169 lb 6.4 oz (76.839 kg)  07/30/15 168 lb 9.6 oz (76.476 kg)  07/24/15 167 lb (75.751 kg)   Laying blood pressure 136/72 pulse 79 Sitting blood pressure 126/70 pulse 84 Standing blood pressure 130/70 pulse 91  Physical Exam  Constitutional: She is well-developed, well-nourished, and in no distress.  HENT:  Head: Normocephalic and atraumatic.  Right Ear: External ear normal.  Left Ear: External ear normal.  Mouth/Throat: Oropharynx is clear and moist.  Eyes: Pupils are equal, round, and reactive to light.  Cardiovascular: Normal rate, regular rhythm and normal heart sounds.   Pulmonary/Chest: Effort normal and breath sounds normal.  Abdominal: Soft. Bowel sounds are normal. She exhibits no distension. There is no tenderness. There is no rebound and no guarding.  Musculoskeletal:  Right knee with no swelling, joint line tenderness, ligamentous laxity, or warmth or erythema, negative McMurray's Left knee with no swelling, joint line tenderness, warmth, erythema, ligaments laxity, there is mild tenderness of the distal quadriceps 3-4 inches proximal to the insertion on the patella, negative McMurray's  Neurological: She is alert.  CN 2-12 intact, 5/5 strength in bilateral biceps, triceps, grip, quads, hamstrings, plantar and dorsiflexion, sensation to light touch intact in bilateral UE and LE, normal gait, 2+ patellar reflexes  Skin: Skin is warm and dry. She is not diaphoretic.     Assessment/Plan: Please see individual problem list.  Strain of left quadriceps tendon I again discussed that her discomfort is in the left quadriceps tendon or muscle. She will not  benefit from an injection in her knee and she was advised that the risks of the injection would likely outweigh the benefits. Given her persistent issues with this we will refer her to sports medicine for further evaluation. I have refilled her Tylenol with Codeine as this is been beneficial. Given return  precautions.  Type 2 diabetes mellitus ([place]) Has been on metformin. Not checking sugars. We'll check a CMP today to evaluate her blood sugar. Has been on statin as well. We'll check LDL today.  Lightheadedness Patient with recent onset of lightheadedness on changing positions. No vertigo. Neurologically intact. No other symptoms with this. Orthostatics negative. This did begin to occur after she started on Aricept. They have noted some improvement in her behavior since being on the Aricept. We will check CBC to ensure that she is not anemic. We will also evaluate hydration status with a creatinine. If these are within the normal range would consider stopping Aricept to see if that would be beneficial for her lightheadedness. Given return precautions.  Abdominal pain, chronic, epigastric This has been a chronic intermittent issue since she had her prior bowel surgery. Benign exam today. No signs of obstruction with good bowel sounds and daily bowel movements. Nothing else on history or exam to direct to a cause. We will check a CMP, lipase, and CBC to evaluate further. Given chronicity if continues would consider imaging of abdomen. Given return precautions.    Orders Placed This Encounter  Procedures  . CBC  . Comp Met (CMET)  . Direct LDL  . Lipase  . Ambulatory referral to Sports Medicine    Referral Priority:  Routine    Referral Type:  Consultation    Number of Visits Requested:  1    Meds ordered this encounter  Medications  . acetaminophen-codeine (TYLENOL #3) 300-30 MG tablet    Sig: Take 1 tablet by mouth every 6 (six) hours as needed for moderate pain.    Dispense:  30 tablet    Refill:  0    Lisa Rumps, MD [place]

## 2015-08-15 NOTE — Assessment & Plan Note (Addendum)
Has been on metformin. Not checking sugars. We'll check a CMP today to evaluate her blood sugar. Has been on statin as well. We'll check LDL today.

## 2015-08-15 NOTE — Assessment & Plan Note (Signed)
Patient with recent onset of lightheadedness on changing positions. No vertigo. Neurologically intact. No other symptoms with this. Orthostatics negative. This did begin to occur after she started on Aricept. They have noted some improvement in her behavior since being on the Aricept. We will check CBC to ensure that she is not anemic. We will also evaluate hydration status with a creatinine. If these are within the normal range would consider stopping Aricept to see if that would be beneficial for her lightheadedness. Given return precautions.

## 2015-08-15 NOTE — Patient Instructions (Signed)
Nice to see you. We are obtain some lab work to evaluate her stomach discomfort. We're going to refer you to sports medicine in [place] for further evaluation of your quadriceps discomfort. Please stay well hydrated. Please trial off of the Aricept and see if this improves or lightheadedness. Please continue your metformin. If you develop persistent lightheadedness, numbness, weakness, vision changes, chest pain, shortness of breath, abdominal pain, or any new or changing symptoms please seek medical attention.

## 2015-08-16 LAB — COMPREHENSIVE METABOLIC PANEL
ALBUMIN: 4 g/dL (ref 3.5–5.2)
ALT: 13 U/L (ref 0–35)
AST: 17 U/L (ref 0–37)
Alkaline Phosphatase: 72 U/L (ref 39–117)
BUN: 15 mg/dL (ref 6–23)
CHLORIDE: 103 meq/L (ref 96–112)
CO2: 23 meq/L (ref 19–32)
CREATININE: 0.59 mg/dL (ref 0.40–1.20)
Calcium: 8.9 mg/dL (ref 8.4–10.5)
GFR: 127.68 mL/min (ref >60.00)
GLUCOSE: 371 mg/dL — AB (ref 70–99)
Potassium: 3.8 mEq/L (ref 3.5–5.1)
Sodium: 138 mEq/L (ref 135–145)
Total Bilirubin: 0.4 mg/dL (ref 0.2–1.2)
Total Protein: 7 g/dL (ref 6.0–8.3)

## 2015-08-16 LAB — LIPASE: Lipase: 18 U/L (ref 11.0–59.0)

## 2015-08-16 LAB — CBC
HCT: 39.1 % (ref 36.0–46.0)
Hemoglobin: 12.9 g/dL (ref 12.0–15.0)
MCHC: 33 g/dL (ref 30.0–36.0)
MCV: 88.5 fl (ref 78.0–100.0)
Platelets: 224 10*3/uL (ref 150.0–400.0)
RBC: 4.42 Mil/uL (ref 3.87–5.11)
RDW: 15.3 % (ref 11.5–15.5)
WBC: 6.9 10*3/uL (ref 4.0–10.5)

## 2015-08-16 LAB — LDL CHOLESTEROL, DIRECT: Direct LDL: 84 mg/dL

## 2015-08-17 ENCOUNTER — Telehealth: Payer: Self-pay | Admitting: Family Medicine

## 2015-08-17 NOTE — Telephone Encounter (Signed)
Moving the patient evaluation from this week to next week for occupational therapy. The physician stated that it would be fine to do so.

## 2015-08-20 ENCOUNTER — Telehealth: Payer: Self-pay | Admitting: *Deleted

## 2015-08-20 NOTE — Telephone Encounter (Signed)
Noted  

## 2015-08-20 NOTE — Telephone Encounter (Signed)
Lisa Hahn stated there will be a delay for PT, due to pt not responding to calls. Patient will not answer any calls.

## 2015-08-20 NOTE — Telephone Encounter (Signed)
FYI

## 2015-08-22 ENCOUNTER — Telehealth: Payer: Self-pay | Admitting: Family Medicine

## 2015-08-22 DIAGNOSIS — R079 Chest pain, unspecified: Secondary | ICD-10-CM | POA: Diagnosis not present

## 2015-08-22 DIAGNOSIS — F0390 Unspecified dementia without behavioral disturbance: Secondary | ICD-10-CM | POA: Diagnosis not present

## 2015-08-22 NOTE — Telephone Encounter (Signed)
Spoke to [employer] requested verbal for PT.  Will work on strengthening bilateral lower extremities, balance, and ambulation. Gave verbal consent.

## 2015-08-22 NOTE — Telephone Encounter (Signed)
Pt physical therapist, [facility], [phone]. Requesting verbal for therapy.

## 2015-08-23 DIAGNOSIS — F0390 Unspecified dementia without behavioral disturbance: Secondary | ICD-10-CM | POA: Diagnosis not present

## 2015-08-23 DIAGNOSIS — R079 Chest pain, unspecified: Secondary | ICD-10-CM | POA: Diagnosis not present

## 2015-08-24 ENCOUNTER — Other Ambulatory Visit: Payer: Self-pay

## 2015-08-24 DIAGNOSIS — S76112A Strain of left quadriceps muscle, fascia and tendon, initial encounter: Secondary | ICD-10-CM | POA: Diagnosis not present

## 2015-08-24 MED ORDER — DONEPEZIL HCL 5 MG PO TABS: 5.0000 mg | ORAL_TABLET | Freq: Every day | ORAL | Status: DC

## 2015-08-30 ENCOUNTER — Telehealth: Payer: Self-pay | Admitting: *Deleted

## 2015-08-30 NOTE — Telephone Encounter (Signed)
FYI Panlilio from [facility] stated that he's having difficulty with the patient son, as far as starting visits. He stated the son will not answer calls or return calls. Panlilio will try again tomorrow.

## 2015-08-31 ENCOUNTER — Telehealth: Payer: Self-pay | Admitting: Family Medicine

## 2015-08-31 NOTE — Telephone Encounter (Signed)
FYI

## 2015-08-31 NOTE — Telephone Encounter (Signed)
Noted  

## 2015-08-31 NOTE — Telephone Encounter (Signed)
Lisa Hahn [phone] called from [facility] home health regarding he spoke to pt son and he stated pt does not need PT. Lisa Hahn he states he will discharge the pt. Nurse already stopped seeing pt as of yesterday. Son states pt does not need it per family decision.   Thank you!

## 2015-11-15 ENCOUNTER — Ambulatory Visit: Payer: Medicare Other | Admitting: Family Medicine

## 2015-11-22 ENCOUNTER — Telehealth: Payer: Self-pay | Admitting: Family Medicine

## 2015-11-22 NOTE — Telephone Encounter (Signed)
She has already scheduled appointment.  Since she is seeing someone else I wanted to keep you and Dr. Caryl Bis in the loop. Just an FYI nothing to do to it.

## 2015-11-22 NOTE — Telephone Encounter (Signed)
Noted  

## 2015-11-22 NOTE — Telephone Encounter (Signed)
Please advise 

## 2015-11-22 NOTE — Telephone Encounter (Signed)
We do not have any appointments and Dr. Caryl Bis will be out of the clinic next week. She can be seen at [facility] or see another provider.

## 2015-11-22 NOTE — Telephone Encounter (Signed)
Patient Name: Lisa Hahn  DOB: 11/04/1940    Initial Comment mother complaining that she feels weak.    Nurse Assessment  Nurse: Mallie Mussel, RN, Alveta Heimlich Date/Time Eilene Ghazi Time): 11/22/2015 2:15:33 PM  Confirm and document reason for call. If symptomatic, describe symptoms. You must click the next button to save text entered. ---Caller states that his mother is complaining of weakness that bean this morning. This happened some time before and her BP was low. He does not have a way to check her BP presently. He states that she feels fine now, but would like to be seen tomorrow. She was able to go to the bathroom on her own. No symptoms at this time, but she wants to be seen tomorrow.  Has the patient traveled out of the country within the last 30 days? ---No  Does the patient have any new or worsening symptoms? ---No     Guidelines    Guideline Title Affirmed Question Affirmed Notes       Final Disposition User   Attempt made - message left Mallie Mussel, RN, Alveta Heimlich    Comments  I checked for an appointment for her for tomorrow. It is all booked up, but there is an appointment for Dr. Derrel Nip that is on hold for 2:15pm. I called the backline and spoke with Rashieedah. She states it cannot be used.  Appointment was scheduled for 11/26/15 at 11:15am. Caller was advised that if she gets worse before then to call us back. He verbalized understanding.

## 2015-11-26 ENCOUNTER — Telehealth: Payer: Self-pay | Admitting: Family Medicine

## 2015-11-26 ENCOUNTER — Ambulatory Visit: Payer: Self-pay | Admitting: Family Medicine

## 2015-11-26 DIAGNOSIS — Z0289 Encounter for other administrative examinations: Secondary | ICD-10-CM

## 2015-11-26 NOTE — Telephone Encounter (Signed)
Patient Name: Lisa Hahn  DOB: 01/24/1941    Initial Comment Caller states mother is dizzy and weak   Nurse Assessment  Nurse: Leilani Merl, RN, Heather Date/Time (Eastern Time): 11/26/2015 11:49:13 AM  Confirm and document reason for call. If symptomatic, describe symptoms. You must click the next button to save text entered. ---Caller states that his mother has been dizzy and weak on Friday, he made her an appt tomorrow. She has not been dizzy or weak since but he is wanting to keep the appt to get her checked.  Has the patient traveled out of the country within the last 30 days? ---Not Applicable  Does the patient have any new or worsening symptoms? ---No

## 2015-11-26 NOTE — Telephone Encounter (Signed)
Patient has an appointment with Dr. Lacinda Axon tomorrow.

## 2015-11-27 ENCOUNTER — Ambulatory Visit: Payer: Medicare Other | Admitting: Family Medicine

## 2015-11-27 DIAGNOSIS — Z0289 Encounter for other administrative examinations: Secondary | ICD-10-CM

## 2015-11-30 ENCOUNTER — Other Ambulatory Visit: Payer: Self-pay

## 2016-01-16 ENCOUNTER — Ambulatory Visit: Payer: Medicare Other | Admitting: Family Medicine

## 2016-01-21 ENCOUNTER — Ambulatory Visit (INDEPENDENT_AMBULATORY_CARE_PROVIDER_SITE_OTHER): Payer: Medicare Other | Admitting: Family

## 2016-01-21 ENCOUNTER — Encounter: Payer: Self-pay | Admitting: Family

## 2016-01-21 VITALS — BP 110/70 | HR 66 | Temp 98.6°F | Wt 160.0 lb

## 2016-01-21 DIAGNOSIS — R21 Rash and other nonspecific skin eruption: Secondary | ICD-10-CM | POA: Diagnosis not present

## 2016-01-21 NOTE — Progress Notes (Signed)
Subjective:    Patient ID: Lisa Hahn, female    DOB: [birth year], 75 y.o.   MRN: [id number]  CC: Lisa Hahn is a 75 y.o. female who presents today for an acute visit.    HPI: Acute visit for CC rash 2 weeks ago, resolved. Has been using a cream at home however cannot recall cream's name. Lives alone. Accompanied by son. Per son, memory at baseline. h/o of eczema. Does report a new soap.  No fever, chills, drainage from rash.      HISTORY:  Past Medical History:  Diagnosis Date  . Arthritis   . Diabetes mellitus without complication ([place])   . Hypertension   . Stroke [facility])    Past Surgical History:  Procedure Laterality Date  . ABDOMINAL HYSTERECTOMY    . ABDOMINAL SURGERY     blockage    Family History  Problem Relation Age of Onset  . Arthritis      Parent    Allergies: Aspirin and Motrin [ibuprofen] Current Outpatient Prescriptions on File Prior to Visit  Medication Sig Dispense Refill  . acetaminophen-codeine (TYLENOL #3) 300-30 MG tablet Take 1 tablet by mouth every 6 (six) hours as needed for moderate pain. 30 tablet 0  . atorvastatin (LIPITOR) 40 MG tablet Take 1 tablet (40 mg total) by mouth daily. 90 tablet 3  . donepezil (ARICEPT) 5 MG tablet Take 1 tablet (5 mg total) by mouth at bedtime. 90 tablet 1  . metFORMIN (GLUCOPHAGE) 500 MG tablet Take 1 tablet (500 mg total) by mouth 2 (two) times daily with a meal. 180 tablet 3   No current facility-administered medications on file prior to visit.     Social History  Substance Use Topics  . Smoking status: Former Research scientist (life sciences)  . Smokeless tobacco: Not on file  . Alcohol use No    Review of Systems  Constitutional: Negative for chills and fever.  Respiratory: Negative for cough.   Cardiovascular: Negative for chest pain and palpitations.  Gastrointestinal: Negative for nausea and vomiting.  Skin: Positive for rash.  Psychiatric/Behavioral: Negative for behavioral problems.      Objective:    BP 110/70    Pulse 66   Temp 98.6 F (37 C)   Wt 160 lb (72.6 kg)   SpO2 98%   BMI 30.23 kg/m    Physical Exam  Constitutional: She appears well-developed and well-nourished.  Eyes: Conjunctivae are normal.  Cardiovascular: Normal rate, regular rhythm, normal heart sounds and normal pulses.   Pulmonary/Chest: Effort normal and breath sounds normal. She has no wheezes. She has no rhonchi. She has no rales.  Neurological: She is alert.  Skin: Skin is warm and dry. Rash is not vesicular.  Excoriated skin noted right upper lateral arm. No papules, drainage, streaking. Skin intact.  Psychiatric: She has a normal mood and affect. Her speech is normal and behavior is normal. Thought content normal.  Vitals reviewed.      Assessment & Plan:   1. Rash and nonspecific skin eruption Rash is resolved at this time. Based on pruritus features, history, and improvement, I suspect eczema. Alternatively alternative contact dermatitis from new soap.     I am having Ms. Fernstrom maintain her metFORMIN, atorvastatin, acetaminophen-codeine, and donepezil.   No orders of the defined types were placed in this encounter.   Return precautions given.   Risks, benefits, and alternatives of the medications and treatment plan prescribed today were discussed, and patient expressed understanding.   Education regarding symptom management  and diagnosis given to patient on AVS.  Continue to follow with Tommi Rumps, MD for routine health maintenance.   Scherrie Gerlach and I agreed with plan.   Mable Paris, FNP

## 2016-01-21 NOTE — Patient Instructions (Signed)
Please make an appt with PCP for fall prevention program.   Let me know if rash returns. Suspect eczema.  If there is no improvement in your symptoms, or if there is any worsening of symptoms, or if you have any additional concerns, please return for re-evaluation; or, if we are closed, consider going to the Emergency Room for evaluation if symptoms urgent.

## 2016-02-05 ENCOUNTER — Ambulatory Visit: Payer: Medicare Other

## 2016-03-21 ENCOUNTER — Other Ambulatory Visit: Payer: Self-pay | Admitting: Family Medicine

## 2016-03-28 ENCOUNTER — Ambulatory Visit (INDEPENDENT_AMBULATORY_CARE_PROVIDER_SITE_OTHER): Payer: Medicare Other

## 2016-03-28 VITALS — BP 122/72 | HR 82 | Temp 98.0°F | Resp 14 | Ht 61.0 in | Wt 155.1 lb

## 2016-03-28 DIAGNOSIS — Z0000 Encounter for general adult medical examination without abnormal findings: Secondary | ICD-10-CM

## 2016-03-28 DIAGNOSIS — Z23 Encounter for immunization: Secondary | ICD-10-CM

## 2016-03-28 NOTE — Progress Notes (Signed)
Subjective:   Lisa Hahn is a 75 y.o. female who presents for an Initial Medicare Annual Wellness Visit.  Review of Systems    No ROS.  Medicare Wellness Visit.  Cardiac Risk Factors include: advanced age (>87men, >84 women);diabetes mellitus;hypertension;obesity (BMI >30kg/m2)     Objective:    Today's Vitals   03/28/16 1559  BP: 122/72  Pulse: 82  Resp: 14  Temp: 98 F (36.7 C)  TempSrc: Oral  SpO2: 96%  Weight: 155 lb 1.9 oz (70.4 kg)  Height: 5\' 1"  (1.549 m)   Body mass index is 29.31 kg/m.   Current Medications (verified) Outpatient Encounter Prescriptions as of 03/28/2016  Medication Sig  . acetaminophen-codeine (TYLENOL #3) 300-30 MG tablet Take 1 tablet by mouth every 6 (six) hours as needed for moderate pain.  Marland Kitchen atorvastatin (LIPITOR) 40 MG tablet Take 1 tablet (40 mg total) by mouth daily.  Marland Kitchen donepezil (ARICEPT) 5 MG tablet TAKE 1 TABLET (5 MG TOTAL) BY MOUTH AT BEDTIME.  . metFORMIN (GLUCOPHAGE) 500 MG tablet Take 1 tablet (500 mg total) by mouth 2 (two) times daily with a meal.   No facility-administered encounter medications on file as of 03/28/2016.     Allergies (verified) Aspirin and Motrin [ibuprofen]   History: Past Medical History:  Diagnosis Date  . Arthritis   . Diabetes mellitus without complication ([place])   . Hypertension   . Stroke [facility])    Past Surgical History:  Procedure Laterality Date  . ABDOMINAL HYSTERECTOMY    . ABDOMINAL SURGERY     blockage    Family History  Problem Relation Age of Onset  . Arthritis      Parent   Social History   Occupational History  . Not on file.   Social History Main Topics  . Smoking status: Former Research scientist (life sciences)  . Smokeless tobacco: Not on file  . Alcohol use No  . Drug use: No  . Sexual activity: Not on file    Tobacco Counseling Counseling given: Not Answered   Activities of Daily Living In your present state of health, do you have any difficulty performing the following activities:  03/28/2016  Hearing? N  Vision? N  Difficulty concentrating or making decisions? Y  Walking or climbing stairs? Y  Dressing or bathing? N  Doing errands, shopping? Y  Preparing Food and eating ? Y  Using the Toilet? N  In the past six months, have you accidently leaked urine? Y  Do you have problems with loss of bowel control? N  Managing your Medications? Y  Managing your Finances? Y  Housekeeping or managing your Housekeeping? Y  Some recent data might be hidden    Immunizations and Health Maintenance Immunization History  Administered Date(s) Administered  . Influenza, High Dose Seasonal PF 03/28/2016   Health Maintenance Due  Topic Date Due  . FOOT EXAM  04/16/1950  . OPHTHALMOLOGY EXAM  04/16/1950  . URINE MICROALBUMIN  04/16/1950  . TETANUS/TDAP  04/17/1959  . COLONOSCOPY  04/16/1990  . ZOSTAVAX  04/16/2000  . DEXA SCAN  04/16/2005  . PNA vac Low Risk Adult (1 of 2 - PCV13) 04/16/2005  . HEMOGLOBIN A1C  12/29/2015    Patient Care Team: Leone Haven, MD as PCP - General (Family Medicine)  Indicate any recent Medical Services you may have received from other than [facility] providers in the past year (date may be approximate).     Assessment:   This is a routine wellness examination for Lisa Hahn.  The goal of the wellness visit is to assist the patient how to close the gaps in care and create a preventative care plan for the patient. Lisa Hahn) is present, HIPPA compliant.  Osteoporosis risk reviewed.  Medications reviewed; taking without issues.  Lisa assists with medication management.  Safety issues reviewed; lives with Lisa in duplex home.  moke detectors in the home. No firearms in the home. Wears seatbelts when riding with others. No violence in the home.  No new identified risk were noted; The patient was oriented x 3; appropriate in dress and manner and no objective.   Assistance required with medication management, meal prep, transportation, home and  financial management.  BMI; discussed the importance of a healthy diet, water intake and exercise. Educational material provided.  High dose influenza vaccine administered L deltoid, tolerated well.  Educational material provided.  Patient Concerns: Increase in memory changes.  Deferred to PCP for follow up.  Appointment scheduled.  Hearing/Vision screen Hearing Screening Comments: Passes the whisper test Vision Screening Comments: Visual acuity not assessed per patient preference since she has regular follow up with her ophthalmologist. Wears reading glasses only.  Dietary issues and exercise activities discussed: Current Exercise Habits: Home exercise routine, Type of exercise: treadmill, Time (Minutes): 10, Frequency (Times/Week): 1, Weekly Exercise (Minutes/Week): 10, Intensity: Mild  Goals    . Healthy Lifestyle          Stay hydrated and drink plenty of fluids Stay active and use the treadmill for exercise Low carb foods.  Lean meats, vegetables      Depression Screen PHQ 2/9 Scores 03/28/2016  PHQ - 2 Score 0    Fall Risk Fall Risk  03/28/2016 11/30/2015  Falls in the past year? No No    Cognitive Function:     6CIT Screen 03/28/2016  What Year? 0 points  What month? 0 points  What time? 0 points  Count back from 20 4 points  Months in reverse 4 points    Screening Tests Health Maintenance  Topic Date Due  . FOOT EXAM  04/16/1950  . OPHTHALMOLOGY EXAM  04/16/1950  . URINE MICROALBUMIN  04/16/1950  . TETANUS/TDAP  04/17/1959  . COLONOSCOPY  04/16/1990  . ZOSTAVAX  04/16/2000  . DEXA SCAN  04/16/2005  . PNA vac Low Risk Adult (1 of 2 - PCV13) 04/16/2005  . HEMOGLOBIN A1C  12/29/2015  . INFLUENZA VACCINE  Completed      Plan:    End of life planning; Advance aging; Advanced directives discussed. No HCPOA/Living Will on file.  Additional information provided to help her start the conversation with her family.  Copy of HCPOA/Living Will short forms  requested upon completion.  Time spent with this topic is 16 minutes.  Medicare Attestation I have personally reviewed: The patient's medical and social history Their use of alcohol, tobacco or illicit drugs Their current medications and supplements The patient's functional ability including ADLs,fall risks, home safety risks, cognitive, and hearing and visual impairment Diet and physical activities Evidence for depression   The patient's weight, height, BMI, and visual acuity have been recorded in the chart.  I have made referrals and provided education to the patient based on review of the above and I have provided the patient with a written personalized care plan for preventive services.    During the course of the visit, Patrece was educated and counseled about the following appropriate screening and preventive services:   Vaccines to include Pneumoccal, Influenza, Hepatitis B, Td,  Zostavax, HCV  Electrocardiogram  Cardiovascular disease screening  Colorectal cancer screening  Bone density screening  Diabetes screening  Glaucoma screening  Mammography/PAP  Nutrition counseling  Smoking cessation counseling  Patient Instructions (the written plan) were given to the patient.    Varney Biles, LPN   [id number]    Reviewed above information.  Agree with f/u with PCP and plan.    Dr Nicki Reaper

## 2016-03-28 NOTE — Patient Instructions (Addendum)
  Lisa Hahn , Thank you for taking time to come for your Medicare Wellness Visit. I appreciate your ongoing commitment to your health goals. Please review the following plan we discussed and let me know if I can assist you in the future.   Follow up with Dr. Caryl Bis as needed.  Merry Christmas and Happy New Year!!  These are the goals we discussed: Goals    . Healthy Lifestyle          Stay hydrated and drink plenty of fluids Stay active and use the treadmill for exercise Low carb foods.  Lean meats, vegetables       This is a list of the screening recommended for you and due dates:  Health Maintenance  Topic Date Due  . Complete foot exam   04/16/1950  . Eye exam for diabetics  04/16/1950  . Urine Protein Check  04/16/1950  . Tetanus Vaccine  04/17/1959  . Colon Cancer Screening  04/16/1990  . Shingles Vaccine  04/16/2000  . DEXA scan (bone density measurement)  04/16/2005  . Pneumonia vaccines (1 of 2 - PCV13) 04/16/2005  . Hemoglobin A1C  12/29/2015  . Flu Shot  Completed

## 2016-04-16 ENCOUNTER — Encounter: Payer: Self-pay | Admitting: Family Medicine

## 2016-04-16 ENCOUNTER — Ambulatory Visit (INDEPENDENT_AMBULATORY_CARE_PROVIDER_SITE_OTHER): Payer: Medicare Other | Admitting: Family Medicine

## 2016-04-16 VITALS — BP 130/86 | HR 76 | Temp 98.3°F | Wt 158.8 lb

## 2016-04-16 DIAGNOSIS — R109 Unspecified abdominal pain: Secondary | ICD-10-CM

## 2016-04-16 DIAGNOSIS — B351 Tinea unguium: Secondary | ICD-10-CM | POA: Diagnosis not present

## 2016-04-16 DIAGNOSIS — E119 Type 2 diabetes mellitus without complications: Secondary | ICD-10-CM

## 2016-04-16 DIAGNOSIS — F0390 Unspecified dementia without behavioral disturbance: Secondary | ICD-10-CM

## 2016-04-16 DIAGNOSIS — K089 Disorder of teeth and supporting structures, unspecified: Secondary | ICD-10-CM | POA: Insufficient documentation

## 2016-04-16 NOTE — Assessment & Plan Note (Signed)
Refer to dentist ° °

## 2016-04-16 NOTE — Assessment & Plan Note (Signed)
Patient with vague centralized abdominal discomfort just prior to having a bowel movement. Does not happen daily. No other associated symptoms with it. Lasts briefly and does not recur after bowel movements. Benign exam today. Could be constipation though she does have daily bowel movements. Did have prior bowel surgery that has good bowel sounds and a benign exam today and has bowel movements daily making obstruction unlikely. We'll check some lab work as outlined below. We'll determine the next step in management once this returns.

## 2016-04-16 NOTE — Progress Notes (Signed)
Pre visit review using our clinic review tool, if applicable. No additional management support is needed unless otherwise documented below in the visit note. 

## 2016-04-16 NOTE — Assessment & Plan Note (Addendum)
Taking metformin. Check A1c today. Other labs outlined below as well. Foot exam completed. Refer to ophthalmology.

## 2016-04-16 NOTE — Progress Notes (Signed)
Lisa Alar, MD Phone: [phone]  Lisa Hahn is a 76 y.o. female who presents today for follow-up.  Dementia: Has been stable since her last visit. Was taking Aricept initially though has not been taking this very consistently recently. Does make her drowsy when she takes it consistently. Notes issues with short-term memory. No wandering since we last saw each other. Does not drive. Is not managing her own finances. Does wash her own clothes and Lisa Hahn and toilets herself. Does go shopping for herself.  Diabetes: Does not check her sugars. Takes metformin if she remembers to take it or if her son is here with her. No polyuria or polydipsia. No hypoglycemia. Has not seen ophthalmology. Continues on Lipitor. No chest pain or claudication. No right upper quadrant pain. No myalgias.  Intermittently get central abdominal sharp discomfort that comes on briefly and goes away suddenly. Only occurs just prior to a bowel movement. No diarrhea, constipation, nausea, vomiting, fevers, dysuria, vaginal discharge, or radiation of the pain. Has been going on intermittently for about 2 months.  PMH: former smoker   ROS see history of present illness  Objective  Physical Exam Vitals:   04/16/16 1552 04/16/16 1614  BP: (!) 142/88 130/86  Pulse: 76   Temp: 98.3 F (36.8 C)     BP Readings from Last 3 Encounters:  04/16/16 130/86  03/28/16 122/72  01/21/16 110/70   Wt Readings from Last 3 Encounters:  04/16/16 158 lb 12.8 oz (72 kg)  03/28/16 155 lb 1.9 oz (70.4 kg)  01/21/16 160 lb (72.6 kg)    Physical Exam  Constitutional: No distress.  HENT:  Poor dentition with several broken teeth, no signs of swelling or erythema inside of her mouth or gums, no drainage from broken teeth  Cardiovascular: Normal rate, regular rhythm and normal heart sounds.   Pulmonary/Chest: Effort normal and breath sounds normal.  Abdominal: Soft. Bowel sounds are normal. She exhibits no distension. There is  no tenderness. There is no rebound and no guarding.  Neurological: She is alert. Gait normal.  Oriented to person and place though not time, failed 3 word recall  Skin: Skin is warm and dry. She is not diaphoretic.   Diabetic Foot Exam - Simple   Simple Foot Form Diabetic Foot exam was performed with the following findings:  Yes 04/16/2016  4:30 PM  Visual Inspection See comments:  Yes Sensation Testing Intact to touch and monofilament testing bilaterally:  Yes Pulse Check Posterior Tibialis and Dorsalis pulse intact bilaterally:  Yes Comments Thickened toenails bilaterally, no other deformities, ulcerations, or skin breakdown     Assessment/Plan: Please see individual problem list.  Dementia Has had some drowsiness with the Aricept. Discussed holding this for a week to see how she does. If no difference in her activity or memory with discontinuing this they will hold off on taking this medication. If there is a difference they will contact us and let us know. Most recent EKG reviewed with normal QTC.  Type 2 diabetes mellitus (HCC) Taking metformin. Check A1c today. Other labs outlined below as well. Foot exam completed. Refer to ophthalmology.  Poor dentition Refer to dentist.  Abdominal pain Patient with vague centralized abdominal discomfort just prior to having a bowel movement. Does not happen daily. No other associated symptoms with it. Lasts briefly and does not recur after bowel movements. Benign exam today. Could be constipation though she does have daily bowel movements. Did have prior bowel surgery that has good bowel sounds and  a benign exam today and has bowel movements daily making obstruction unlikely. We'll check some lab work as outlined below. We'll determine the next step in management once this returns.  Onychomycosis Refer to podiatry.   Orders Placed This Encounter  Procedures  . Comp Met (CMET)  . HgB A1c  . Direct LDL  . Sed Rate (ESR)  . CBC  .  Ambulatory referral to Dentistry    Referral Priority:   Routine    Referral Type:   Consultation    Referral Reason:   Specialty Services Required    Requested Specialty:   Dental General Practice    Number of Visits Requested:   1  . Ambulatory referral to Ophthalmology    Referral Priority:   Routine    Referral Type:   Consultation    Referral Reason:   Specialty Services Required    Requested Specialty:   Ophthalmology    Number of Visits Requested:   1  . Ambulatory referral to Podiatry    Referral Priority:   Routine    Referral Type:   Consultation    Referral Reason:   Specialty Services Required    Requested Specialty:   Podiatry    Number of Visits Requested:   1    No orders of the defined types were placed in this encounter.  Tommi Rumps, MD [employer]

## 2016-04-16 NOTE — Patient Instructions (Signed)
Nice to see you. We will check some lab work today and call you with the results. We'll refer you to a dentist and an ophthalmologist. Given the drowsiness with consistent use of Aricept I would advise discontinuing this and monitoring to see how she does.

## 2016-04-16 NOTE — Assessment & Plan Note (Signed)
Refer to podiatry

## 2016-04-16 NOTE — Assessment & Plan Note (Signed)
Has had some drowsiness with the Aricept. Discussed holding this for a week to see how she does. If no difference in her activity or memory with discontinuing this they will hold off on taking this medication. If there is a difference they will contact us and let us know. Most recent EKG reviewed with normal QTC.

## 2016-04-17 LAB — LDL CHOLESTEROL, DIRECT: Direct LDL: 130 mg/dL

## 2016-04-17 LAB — COMPREHENSIVE METABOLIC PANEL
ALT: 11 U/L (ref 0–35)
AST: 16 U/L (ref 0–37)
Albumin: 4.3 g/dL (ref 3.5–5.2)
Alkaline Phosphatase: 69 U/L (ref 39–117)
BILIRUBIN TOTAL: 0.6 mg/dL (ref 0.2–1.2)
BUN: 15 mg/dL (ref 6–23)
CALCIUM: 9.9 mg/dL (ref 8.4–10.5)
CHLORIDE: 103 meq/L (ref 96–112)
CO2: 27 meq/L (ref 19–32)
CREATININE: 0.63 mg/dL (ref 0.40–1.20)
GFR: 118.16 mL/min (ref >60.00)
GLUCOSE: 182 mg/dL — AB (ref 70–99)
Potassium: 4.7 mEq/L (ref 3.5–5.1)
SODIUM: 140 meq/L (ref 135–145)
Total Protein: 7.7 g/dL (ref 6.0–8.3)

## 2016-04-17 LAB — HEMOGLOBIN A1C: Hgb A1c MFr Bld: 8 % — ABNORMAL HIGH (ref 4.6–6.5)

## 2016-04-18 ENCOUNTER — Encounter: Payer: Self-pay | Admitting: Family Medicine

## 2016-04-20 ENCOUNTER — Other Ambulatory Visit: Payer: Self-pay | Admitting: Family Medicine

## 2016-04-23 ENCOUNTER — Other Ambulatory Visit: Payer: Self-pay | Admitting: Family Medicine

## 2016-04-23 MED ORDER — ATORVASTATIN CALCIUM 80 MG PO TABS: 80.0000 mg | ORAL_TABLET | Freq: Every day | ORAL | 1 refills | Status: DC

## 2016-04-23 MED ORDER — METFORMIN HCL 1000 MG PO TABS: 1000.0000 mg | ORAL_TABLET | Freq: Two times a day (BID) | ORAL | 1 refills | Status: DC

## 2016-04-28 ENCOUNTER — Telehealth: Payer: Self-pay

## 2016-04-28 DIAGNOSIS — E785 Hyperlipidemia, unspecified: Secondary | ICD-10-CM

## 2016-04-28 DIAGNOSIS — R109 Unspecified abdominal pain: Secondary | ICD-10-CM

## 2016-04-28 NOTE — Telephone Encounter (Signed)
Left message to return call 

## 2016-04-28 NOTE — Telephone Encounter (Signed)
left message to return call

## 2016-04-28 NOTE — Telephone Encounter (Signed)
-----   Message from Leone Haven, MD sent at 04/28/2016  8:28 AM EST ----- Regarding: FW: labs Please let the patient and her son know that there were a couple of labs that did not get processed at her last visit. We can have them come back in to recollect them if they are able. Thanks. Randall Hiss.    ----- Message ----- From: Leeanne Rio, CMA Sent: 04/25/2016  10:30 AM To: Leone Haven, MD Subject: RE: labs                                       I spoke with [facility] lab & it looks as though they overlooked the CBC & sed rate collected on 04/16/16. The blood is now outdated, so they will have to be recollected.   ----- Message ----- From: Leone Haven, MD Sent: 04/23/2016   9:41 AM To: Leeanne Rio, CMA Subject: labs                                           It appears this patient had lab work drawn and some of the labs resulted and 2 of them are still in process. Can you check to see why this is? Thanks. Randall Hiss. ----- Message ----- From: SYSTEM Sent: 04/21/2016  12:05 AM To: Leone Haven, MD

## 2016-04-30 NOTE — Telephone Encounter (Signed)
Orders placed.

## 2016-04-30 NOTE — Telephone Encounter (Signed)
Patient is scheduled for lab appointment.

## 2016-05-02 ENCOUNTER — Other Ambulatory Visit: Payer: Medicare Other

## 2016-05-05 ENCOUNTER — Other Ambulatory Visit: Payer: Medicare Other

## 2016-05-06 ENCOUNTER — Ambulatory Visit: Payer: Self-pay | Admitting: Podiatry

## 2016-05-07 NOTE — Progress Notes (Signed)
Letter sent to patient.

## 2016-05-08 ENCOUNTER — Encounter: Payer: Self-pay | Admitting: *Deleted

## 2016-05-08 DIAGNOSIS — R350 Frequency of micturition: Secondary | ICD-10-CM | POA: Diagnosis not present

## 2016-05-08 DIAGNOSIS — Z87891 Personal history of nicotine dependence: Secondary | ICD-10-CM | POA: Diagnosis not present

## 2016-05-08 DIAGNOSIS — I10 Essential (primary) hypertension: Secondary | ICD-10-CM | POA: Insufficient documentation

## 2016-05-08 DIAGNOSIS — E119 Type 2 diabetes mellitus without complications: Secondary | ICD-10-CM | POA: Insufficient documentation

## 2016-05-08 DIAGNOSIS — Z8673 Personal history of transient ischemic attack (TIA), and cerebral infarction without residual deficits: Secondary | ICD-10-CM | POA: Insufficient documentation

## 2016-05-08 DIAGNOSIS — Z79899 Other long term (current) drug therapy: Secondary | ICD-10-CM | POA: Insufficient documentation

## 2016-05-08 DIAGNOSIS — N390 Urinary tract infection, site not specified: Secondary | ICD-10-CM | POA: Insufficient documentation

## 2016-05-08 DIAGNOSIS — Z7984 Long term (current) use of oral hypoglycemic drugs: Secondary | ICD-10-CM | POA: Insufficient documentation

## 2016-05-08 DIAGNOSIS — R300 Dysuria: Secondary | ICD-10-CM | POA: Diagnosis not present

## 2016-05-08 DIAGNOSIS — R197 Diarrhea, unspecified: Secondary | ICD-10-CM | POA: Diagnosis not present

## 2016-05-08 DIAGNOSIS — R109 Unspecified abdominal pain: Secondary | ICD-10-CM | POA: Diagnosis not present

## 2016-05-08 LAB — CBC
HCT: 40.9 % (ref 35.0–47.0)
Hemoglobin: 13.5 g/dL (ref 12.0–16.0)
MCH: 30.1 pg (ref 26.0–34.0)
MCHC: 33.1 g/dL (ref 32.0–36.0)
MCV: 90.9 fL (ref 80.0–100.0)
PLATELETS: 221 10*3/uL (ref 150–440)
RBC: 4.49 MIL/uL (ref 3.80–5.20)
RDW: 15.1 % — ABNORMAL HIGH (ref 11.5–14.5)
WBC: 9.3 10*3/uL (ref 3.6–11.0)

## 2016-05-08 LAB — COMPREHENSIVE METABOLIC PANEL
ALK PHOS: 62 U/L (ref 38–126)
ALT: 13 U/L — AB (ref 14–54)
AST: 21 U/L (ref 15–41)
Albumin: 4.3 g/dL (ref 3.5–5.0)
Anion gap: 10 (ref 5–15)
BUN: 17 mg/dL (ref 6–20)
CALCIUM: 8.9 mg/dL (ref 8.9–10.3)
CO2: 25 mmol/L (ref 22–32)
CREATININE: 0.61 mg/dL (ref 0.44–1.00)
Chloride: 106 mmol/L (ref 101–111)
GFR calc Af Amer: >60 mL/min (ref >60)
GFR calc non Af Amer: >60 mL/min (ref >60)
GLUCOSE: 145 mg/dL — AB (ref 65–99)
Potassium: 3.7 mmol/L (ref 3.5–5.1)
SODIUM: 141 mmol/L (ref 135–145)
Total Bilirubin: 0.6 mg/dL (ref 0.3–1.2)
Total Protein: 7.9 g/dL (ref 6.5–8.1)

## 2016-05-08 LAB — LIPASE, BLOOD: Lipase: 21 U/L (ref 11–51)

## 2016-05-08 LAB — TROPONIN I

## 2016-05-08 NOTE — ED Triage Notes (Signed)
Pt to triage via wheelchair.  Pt reports abd pain  Pt reports vomiting today.  Pt also states she has urinary frequency.  Pt has dementia.  Pt alert.  Speech clear

## 2016-05-09 ENCOUNTER — Emergency Department
Admission: EM | Admit: 2016-05-09 | Discharge: 2016-05-09 | Disposition: A | Discharge status: Home / Self Care | Payer: Medicare Other | Attending: Emergency Medicine | Admitting: Emergency Medicine

## 2016-05-09 DIAGNOSIS — N390 Urinary tract infection, site not specified: Secondary | ICD-10-CM

## 2016-05-09 DIAGNOSIS — R350 Frequency of micturition: Secondary | ICD-10-CM

## 2016-05-09 DIAGNOSIS — R300 Dysuria: Secondary | ICD-10-CM

## 2016-05-09 LAB — URINALYSIS, COMPLETE (UACMP) WITH MICROSCOPIC
BILIRUBIN URINE: NEGATIVE
GLUCOSE, UA: NEGATIVE mg/dL
KETONES UR: 5 mg/dL — AB
LEUKOCYTES UA: NEGATIVE
NITRITE: POSITIVE — AB
PH: 5 (ref 5.0–8.0)
Protein, ur: NEGATIVE mg/dL
RBC / HPF: NONE SEEN RBC/hpf (ref 0–5)
Specific Gravity, Urine: 1.018 (ref 1.005–1.030)

## 2016-05-09 MED ORDER — ONDANSETRON 4 MG PO TBDP: 4.0000 mg | ORAL_TABLET | Freq: Three times a day (TID) | ORAL | 0 refills | Status: DC | PRN

## 2016-05-09 MED ORDER — ONDANSETRON 4 MG PO TBDP
4.0000 mg | ORAL_TABLET | Freq: Once | ORAL | Status: AC
  Administered 2016-05-09: 4 mg via ORAL
  Filled 2016-05-09: qty 1

## 2016-05-09 MED ORDER — CEPHALEXIN 500 MG PO CAPS
500.0000 mg | ORAL_CAPSULE | Freq: Once | ORAL | Status: AC
  Administered 2016-05-09: 500 mg via ORAL
  Filled 2016-05-09: qty 1

## 2016-05-09 MED ORDER — CEPHALEXIN 500 MG PO CAPS: 500.0000 mg | ORAL_CAPSULE | Freq: Three times a day (TID) | ORAL | 0 refills | Status: DC

## 2016-05-09 NOTE — Discharge Instructions (Signed)
1. Start antibiotic as prescribed (Keflex 500 mg 3 times daily 7 days). 2. You may take Zofran (#15) as needed for nausea. 3. Return to the ER for worsening symptoms, persistent vomiting, fever, difficulty breathing or other concerns.

## 2016-05-09 NOTE — ED Provider Notes (Signed)
[facility] Emergency Department Provider Note   ____________________________________________   First MD Initiated Contact with Patient 05/09/16 [phone]     (approximate)  I have reviewed the triage vital signs and the nursing notes.   HISTORY  Chief Complaint Abdominal Pain and Urinary Frequency  History limited by dementia  HPI Lisa Hahn is a 76 y.o. female who presents to the ED from home with a chief complaint of suprapubic abdominal pain, vomiting x1, urinary frequency.Patient reports symptoms of urinary frequency for the past 2-3 months. Developed suprapubic abdominal pain, dysuria and vomited once yesterday afternoon. Denies associated fever, chills, chest pain, shortness of breath, hematuria, diarrhea. Denies recent travel or trauma. Nothing makes her symptoms better or worse.   Past Medical History:  Diagnosis Date  . Arthritis   . Diabetes mellitus without complication ([place])   . Hypertension   . Stroke [facility])     Patient Active Problem List   Diagnosis Date Noted  . Poor dentition 04/16/2016  . Abdominal pain 04/16/2016  . Onychomycosis 04/16/2016  . Lightheadedness 08/15/2015  . Abdominal pain, chronic, epigastric 08/15/2015  . Dementia 07/25/2015  . Exertional chest pain 06/28/2015  . Strain of left quadriceps tendon 06/28/2015  . Type 2 diabetes mellitus ([place]) 06/28/2015  . Vision changes 06/28/2015    Past Surgical History:  Procedure Laterality Date  . ABDOMINAL HYSTERECTOMY    . ABDOMINAL SURGERY     blockage     Prior to Admission medications   Medication Sig Start Date End Date Taking? Authorizing Provider  acetaminophen-codeine (TYLENOL #3) 300-30 MG tablet Take 1 tablet by mouth every 6 (six) hours as needed for moderate pain. 08/15/15   Leone Haven, MD  atorvastatin (LIPITOR) 80 MG tablet Take 1 tablet (80 mg total) by mouth daily. 04/23/16   Leone Haven, MD  cephALEXin (KEFLEX) 500 MG capsule Take 1  capsule (500 mg total) by mouth 3 (three) times daily. 05/09/16   Paulette Blanch, MD  donepezil (ARICEPT) 5 MG tablet TAKE 1 TABLET (5 MG TOTAL) BY MOUTH AT BEDTIME. 03/21/16   Leone Haven, MD  metFORMIN (GLUCOPHAGE) 1000 MG tablet Take 1 tablet (1,000 mg total) by mouth 2 (two) times daily with a meal. 04/23/16   Leone Haven, MD  ondansetron (ZOFRAN ODT) 4 MG disintegrating tablet Take 1 tablet (4 mg total) by mouth every 8 (eight) hours as needed for nausea or vomiting. 05/09/16   Paulette Blanch, MD    Allergies Aspirin and Motrin [ibuprofen]  Family History  Problem Relation Age of Onset  . Arthritis      Parent    Social History Social History  Substance Use Topics  . Smoking status: Former Research scientist (life sciences)  . Smokeless tobacco: Never Used  . Alcohol use No    Review of Systems  Constitutional: No fever/chills. Eyes: No visual changes. ENT: No sore throat. Cardiovascular: Denies chest pain. Respiratory: Denies shortness of breath. Gastrointestinal: Positive for abdominal pain.  Positive for nausea and vomiting.  No diarrhea.  No constipation. Genitourinary: Positive for dysuria, frequency. Musculoskeletal: Negative for back pain. Skin: Negative for rash. Neurological: Negative for headaches, focal weakness or numbness.  10-point ROS otherwise negative.  ____________________________________________   PHYSICAL EXAM:  VITAL SIGNS: ED Triage Vitals  Enc Vitals Group     BP 05/08/16 2133 (!) 159/126     Pulse Rate 05/08/16 2132 79     Resp 05/08/16 2132 18     Temp 05/08/16  2132 98.5 F (36.9 C)     Temp Source 05/08/16 2132 Oral     SpO2 05/08/16 2132 99 %     Weight 05/08/16 2133 150 lb (68 kg)     Height 05/08/16 2133 5\' 2"  (1.575 m)     Head Circumference --      Peak Flow --      Pain Score 05/08/16 2133 10     Pain Loc --      Pain Edu? --      Excl. in [place]? --     Constitutional: Alert and oriented. Well appearing and in no acute distress. Eyes:  Conjunctivae are normal. PERRL. EOMI. Head: Atraumatic. Nose: No congestion/rhinnorhea. Mouth/Throat: Mucous membranes are moist.  Oropharynx non-erythematous. Neck: No stridor.   Cardiovascular: Normal rate, regular rhythm. Grossly normal heart sounds.  Good peripheral circulation. Respiratory: Normal respiratory effort.  No retractions. Lungs CTAB. Gastrointestinal: Soft and minimally tender to palpation suprapubic area without rebound or guarding. No distention. No abdominal bruits. No CVA tenderness. Musculoskeletal: No lower extremity tenderness nor edema.  No joint effusions. Neurologic:  Normal speech and language. No gross focal neurologic deficits are appreciated. No gait instability. Skin:  Skin is warm, dry and intact. No rash noted. Psychiatric: Mood and affect are normal. Speech and behavior are normal.  ____________________________________________   LABS (all labs ordered are listed, but only abnormal results are displayed)  Labs Reviewed  COMPREHENSIVE METABOLIC PANEL - Abnormal; Notable for the following:       Result Value   Glucose, Bld 145 (*)    ALT 13 (*)    All other components within normal limits  CBC - Abnormal; Notable for the following:    RDW 15.1 (*)    All other components within normal limits  URINALYSIS, COMPLETE (UACMP) WITH MICROSCOPIC - Abnormal; Notable for the following:    Color, Urine YELLOW (*)    APPearance CLEAR (*)    Hgb urine dipstick SMALL (*)    Ketones, ur 5 (*)    Nitrite POSITIVE (*)    Bacteria, UA FEW (*)    Squamous Epithelial / LPF 0-5 (*)    All other components within normal limits  LIPASE, BLOOD  TROPONIN I   ____________________________________________  EKG  ED ECG REPORT I, Perle Gibbon J, the attending physician, personally viewed and interpreted this ECG.   Date: 05/09/2016  EKG Time: 2137  Rate: 84  Rhythm: normal EKG, normal sinus rhythm  Axis: Normal  Intervals:none  ST&T Change:  Nonspecific  ____________________________________________  RADIOLOGY  None ____________________________________________   PROCEDURES  Procedure(s) performed: None  Procedures  Critical Care performed: No  ____________________________________________   INITIAL IMPRESSION / ASSESSMENT AND PLAN / ED COURSE  Pertinent labs & imaging results that were available during my care of the patient were reviewed by me and considered in my medical decision making (see chart for details).  76 year old female who presents with suprapubic abdominal pain, one episode of vomiting and urinary symptoms. Laboratory results are reassuring; urinalysis positive for nitrites. Will administer ODT Zofran, PO trial, initiate Keflex.  Clinical Course as of May 10 255  Fri May 09, 2016  0207 Patient tolerated ginger ale without emesis. Will discharge home with ODT Zofran, Keflex and she will follow-up with her PCP next week. Strict return precautions given. Patient verbalizes understanding and agrees with plan of care.  [JS]    Clinical Course User Index [JS] Paulette Blanch, MD     ____________________________________________  FINAL CLINICAL IMPRESSION(S) / ED DIAGNOSES  Final diagnoses:  Lower urinary tract infectious disease  Dysuria  Urinary frequency      NEW MEDICATIONS STARTED DURING THIS VISIT:  New Prescriptions   CEPHALEXIN (KEFLEX) 500 MG CAPSULE    Take 1 capsule (500 mg total) by mouth 3 (three) times daily.   ONDANSETRON (ZOFRAN ODT) 4 MG DISINTEGRATING TABLET    Take 1 tablet (4 mg total) by mouth every 8 (eight) hours as needed for nausea or vomiting.     Note:  This document was prepared using Dragon voice recognition software and may include unintentional dictation errors.    Paulette Blanch, MD 05/09/16 [phone]

## 2016-06-25 ENCOUNTER — Ambulatory Visit: Payer: Medicare Other | Admitting: Family Medicine

## 2016-07-11 ENCOUNTER — Ambulatory Visit: Payer: Medicare Other | Admitting: Family Medicine

## 2016-08-01 ENCOUNTER — Ambulatory Visit: Payer: Medicare Other | Admitting: Family Medicine

## 2016-08-05 ENCOUNTER — Telehealth: Payer: Self-pay | Admitting: *Deleted

## 2016-08-13 ENCOUNTER — Telehealth: Payer: Self-pay | Admitting: Family Medicine

## 2016-08-13 NOTE — Telephone Encounter (Signed)
Pt's son dropped off FMLA papers to be filled out by Dr. Caryl Bis. Papers are up front in Dr. Ellen Henri color folder.

## 2016-08-15 NOTE — Telephone Encounter (Signed)
In Dr.Sonnenbergs green form folder to fill out

## 2016-08-19 NOTE — Telephone Encounter (Signed)
Patient's son requested a update on this paperwork  [place] [phone]

## 2016-08-19 NOTE — Telephone Encounter (Signed)
Please advise 

## 2016-08-22 ENCOUNTER — Ambulatory Visit (INDEPENDENT_AMBULATORY_CARE_PROVIDER_SITE_OTHER): Payer: Medicare Other | Admitting: Family Medicine

## 2016-08-22 ENCOUNTER — Encounter: Payer: Self-pay | Admitting: Family Medicine

## 2016-08-22 ENCOUNTER — Telehealth: Payer: Self-pay | Admitting: Radiology

## 2016-08-22 VITALS — BP 142/68 | HR 75 | Temp 98.6°F | Wt 167.2 lb

## 2016-08-22 DIAGNOSIS — R079 Chest pain, unspecified: Secondary | ICD-10-CM | POA: Diagnosis not present

## 2016-08-22 DIAGNOSIS — M791 Myalgia, unspecified site: Secondary | ICD-10-CM

## 2016-08-22 DIAGNOSIS — E119 Type 2 diabetes mellitus without complications: Secondary | ICD-10-CM

## 2016-08-22 DIAGNOSIS — R0989 Other specified symptoms and signs involving the circulatory and respiratory systems: Secondary | ICD-10-CM

## 2016-08-22 DIAGNOSIS — S76112D Strain of left quadriceps muscle, fascia and tendon, subsequent encounter: Secondary | ICD-10-CM

## 2016-08-22 DIAGNOSIS — F0390 Unspecified dementia without behavioral disturbance: Secondary | ICD-10-CM

## 2016-08-22 DIAGNOSIS — E785 Hyperlipidemia, unspecified: Secondary | ICD-10-CM | POA: Diagnosis not present

## 2016-08-22 LAB — POCT URINALYSIS DIPSTICK
BILIRUBIN UA: NEGATIVE
GLUCOSE UA: 250
Ketones, UA: NEGATIVE
Nitrite, UA: NEGATIVE
Protein, UA: NEGATIVE
SPEC GRAV UA: 1.02 (ref 1.010–1.025)
UROBILINOGEN UA: 0.2 U/dL
pH, UA: 5.5 (ref 5.0–8.0)

## 2016-08-22 LAB — URINALYSIS, MICROSCOPIC ONLY

## 2016-08-22 LAB — HEMOGLOBIN A1C: Hgb A1c MFr Bld: 9.6 % — ABNORMAL HIGH (ref 4.6–6.5)

## 2016-08-22 LAB — LDL CHOLESTEROL, DIRECT: LDL DIRECT: 62 mg/dL

## 2016-08-22 NOTE — Assessment & Plan Note (Signed)
Possible myalgias. We'll check a CK. She'll continue her medications currently. Check other lab work as well.

## 2016-08-22 NOTE — Telephone Encounter (Signed)
[facility] lab called and stated they are unable to run blood out of green tube because it was a very minimal amount. Pt was a difficult stick and was attempted twice. Pt needs to return for labs that were done if the provider would like. Will reorder labs to future but pt needs to be scheduled for lab appt.

## 2016-08-22 NOTE — Assessment & Plan Note (Signed)
Resolved. She'll monitor for recurrence.

## 2016-08-22 NOTE — Assessment & Plan Note (Addendum)
Intermittent rare centralized chest discomfort. EKG performed with no evident ischemic changes. We'll have her see her cardiologist in follow-up as she is past due. Given return precautions.

## 2016-08-22 NOTE — Assessment & Plan Note (Signed)
Slightly worsened from previously. We'll refer to neurology.

## 2016-08-22 NOTE — Telephone Encounter (Signed)
Faxed today

## 2016-08-22 NOTE — Assessment & Plan Note (Signed)
Check A1c and direct LDL today. Continue metformin.

## 2016-08-22 NOTE — Progress Notes (Signed)
Tommi Rumps, MD Phone: [phone]  Lisa Hahn is a 76 y.o. female who presents today for follow-up.  Dementia: Patient's son notes this had improved to some degree though has worsened recently. She is getting angry and meanwhile on the Aricept though this is been better since stopping this. She started to wonder more again. She did get lost and had trouble finding her way home to call her son in the middle the night. Son is doing all her cooking.  Diabetes: Not checking sugars. She is taking metformin. Does note some polyuria. No dysuria or abdominal pain. No hypoglycemia. Has not seen ophthalmology yet.  Hyperlipidemia: Taking Lipitor. No right upper quadrant pain. Does note some myalgias in her posterior thighs. Does report intermittent chest pain. Last episode was one and a half weeks ago. Was centrally located while she is sitting down. Possibly some dyspnea with it. No radiation or diaphoresis. She took 2 aspirin and this resolved. Her son does report earlier in the day she had been lifting a fair amount of boxes. Asymptomatic currently.  She reports her left knee pain is resolved.  PMH: former smoker   ROS see history of present illness  Objective  Physical Exam Vitals:   08/22/16 0805  BP: (!) 142/68  Pulse: 75  Temp: 98.6 F (37 C)    BP Readings from Last 3 Encounters:  08/22/16 (!) 142/68  05/09/16 (!) 157/80  04/16/16 130/86   Wt Readings from Last 3 Encounters:  08/22/16 167 lb 3.2 oz (75.8 kg)  05/08/16 150 lb (68 kg)  04/16/16 158 lb 12.8 oz (72 kg)    Physical Exam  Constitutional: No distress.  HENT:  Head: Normocephalic and atraumatic.  Cardiovascular: Normal rate, regular rhythm and normal heart sounds.   1+ PT and DP pulses bilaterally  Pulmonary/Chest: Effort normal and breath sounds normal. She exhibits no tenderness.  Musculoskeletal: She exhibits no edema.  Neurological: She is alert. Gait normal.  Skin: Skin is warm and dry. She is not  diaphoretic.   EKG: Appears to be normal sinus rhythm as her do appear to be P waves in front of every QRS at a normal PR interval though somewhat difficult to interpret given low voltage  Assessment/Plan: Please see individual problem list.  Type 2 diabetes mellitus (HCC) Check A1c and direct LDL today. Continue metformin.  Dementia Slightly worsened from previously. We'll refer to neurology.  Chest pain Intermittent rare centralized chest discomfort. EKG performed with no evident ischemic changes. We'll have her see her cardiologist in follow-up as she is past due. Given return precautions.  Strain of left quadriceps tendon Resolved. She'll monitor for recurrence.  Hyperlipidemia Possible myalgias. We'll check a CK. She'll continue her medications currently. Check other lab work as well.  Decreased pedal pulses Decreased pedal pulses. We'll have her do ABIs.   Orders Placed This Encounter  Procedures  . Urine Culture  . HgB A1c  . Urine Microscopic Only  . Comp Met (CMET)    Standing Status:   Future    Standing Expiration Date:   08/22/2017  . CK (Creatine Kinase)    Standing Status:   Future    Standing Expiration Date:   08/22/2017  . Direct LDL    Standing Status:   Future    Number of Occurrences:   1    Standing Expiration Date:   08/22/2017  . Ambulatory referral to Neurology    Referral Priority:   Routine    Referral Type:  Consultation    Referral Reason:   Specialty Services Required    Requested Specialty:   Neurology    Number of Visits Requested:   1  . Ambulatory referral to Ophthalmology    Referral Priority:   Routine    Referral Type:   Consultation    Referral Reason:   Specialty Services Required    Requested Specialty:   Ophthalmology    Number of Visits Requested:   1  . Ambulatory referral to Cardiology    Referral Priority:   Routine    Referral Type:   Consultation    Referral Reason:   Specialty Services Required    Requested  Specialty:   Cardiology    Number of Visits Requested:   1  . POCT Urinalysis Dipstick  . EKG 12-Lead    Tommi Rumps, MD [place]

## 2016-08-22 NOTE — Telephone Encounter (Signed)
Left message to return call 

## 2016-08-22 NOTE — Telephone Encounter (Signed)
Patient should return for the lab work. Please get her scheduled.

## 2016-08-22 NOTE — Patient Instructions (Signed)
Nice to see you. We're going to get you to see neurology for your dementia. We'll check lab work today and contact you with the results. I would like you to follow up with your cardiologist. If you develop any persistent chest pain, shortness of breath, or any new or changing symptoms please seek medical attention immediately.

## 2016-08-22 NOTE — Assessment & Plan Note (Signed)
Decreased pedal pulses. We'll have her do ABIs.

## 2016-08-22 NOTE — Telephone Encounter (Signed)
Please advise 

## 2016-08-23 LAB — URINE CULTURE

## 2016-08-25 NOTE — Telephone Encounter (Signed)
Can you assist in getting a lab appt scheduled for this patient, thanks

## 2016-08-25 NOTE — Telephone Encounter (Signed)
Patient has been schedule.

## 2016-08-25 NOTE — Telephone Encounter (Signed)
Yes they didn't get enough to run all the tests, needs to return, thanks

## 2016-08-25 NOTE — Telephone Encounter (Signed)
Does pt still need to get labs done? Son says she came in on 08/22/2016 and got labs done on that day. Please advise? Thank you!

## 2016-08-25 NOTE — Telephone Encounter (Signed)
OK. Thank you

## 2016-08-26 ENCOUNTER — Other Ambulatory Visit: Payer: Self-pay | Admitting: Family Medicine

## 2016-08-26 MED ORDER — EMPAGLIFLOZIN 10 MG PO TABS: 10.0000 mg | ORAL_TABLET | Freq: Every day | ORAL | 3 refills | Status: DC

## 2016-08-27 ENCOUNTER — Other Ambulatory Visit (INDEPENDENT_AMBULATORY_CARE_PROVIDER_SITE_OTHER): Payer: Medicare Other

## 2016-08-27 ENCOUNTER — Other Ambulatory Visit: Payer: Self-pay | Admitting: Family Medicine

## 2016-08-27 DIAGNOSIS — E119 Type 2 diabetes mellitus without complications: Secondary | ICD-10-CM | POA: Diagnosis not present

## 2016-08-27 DIAGNOSIS — R079 Chest pain, unspecified: Secondary | ICD-10-CM

## 2016-08-27 LAB — COMPREHENSIVE METABOLIC PANEL
ALK PHOS: 70 U/L (ref 39–117)
ALT: 15 U/L (ref 0–35)
AST: 18 U/L (ref 0–37)
Albumin: 4.3 g/dL (ref 3.5–5.2)
BUN: 21 mg/dL (ref 6–23)
CALCIUM: 9.6 mg/dL (ref 8.4–10.5)
CO2: 25 mEq/L (ref 19–32)
CREATININE: 0.6 mg/dL (ref 0.40–1.20)
Chloride: 104 mEq/L (ref 96–112)
GFR: 124.88 mL/min (ref >60.00)
GLUCOSE: 198 mg/dL — AB (ref 70–99)
Potassium: 3.5 mEq/L (ref 3.5–5.1)
Sodium: 139 mEq/L (ref 135–145)
TOTAL PROTEIN: 7.4 g/dL (ref 6.0–8.3)
Total Bilirubin: 0.5 mg/dL (ref 0.2–1.2)

## 2016-08-27 LAB — CK: Total CK: 105 U/L (ref 7–177)

## 2016-08-27 MED ORDER — SITAGLIPTIN PHOSPHATE 100 MG PO TABS
100.0000 mg | ORAL_TABLET | Freq: Every day | ORAL | 3 refills | Status: DC
Start: 2016-08-27 — End: 2017-01-27

## 2016-09-03 DIAGNOSIS — I10 Essential (primary) hypertension: Secondary | ICD-10-CM | POA: Diagnosis not present

## 2016-09-03 DIAGNOSIS — E7800 Pure hypercholesterolemia, unspecified: Secondary | ICD-10-CM | POA: Diagnosis not present

## 2016-09-03 DIAGNOSIS — E782 Mixed hyperlipidemia: Secondary | ICD-10-CM | POA: Diagnosis not present

## 2016-09-03 DIAGNOSIS — R079 Chest pain, unspecified: Secondary | ICD-10-CM | POA: Diagnosis not present

## 2016-09-03 DIAGNOSIS — I208 Other forms of angina pectoris: Secondary | ICD-10-CM | POA: Diagnosis not present

## 2016-09-03 DIAGNOSIS — R0602 Shortness of breath: Secondary | ICD-10-CM | POA: Diagnosis not present

## 2016-09-09 DIAGNOSIS — Z01810 Encounter for preprocedural cardiovascular examination: Secondary | ICD-10-CM | POA: Diagnosis not present

## 2016-09-12 ENCOUNTER — Ambulatory Visit: Payer: Medicare Other | Admitting: Family Medicine

## 2016-09-17 ENCOUNTER — Telehealth: Payer: Self-pay | Admitting: Family Medicine

## 2016-09-17 NOTE — Telephone Encounter (Signed)
Pt son has a question regarding his FMLA paperwork. Question is about pt needing 24 hour care paperwork they stated they did not received anything back. Please advise?  Call pt son @ [phone]

## 2016-09-17 NOTE — Telephone Encounter (Signed)
Notified patients son we did fax another form on 08/28/16 but will refax again

## 2016-10-22 ENCOUNTER — Other Ambulatory Visit: Payer: Self-pay | Admitting: Family Medicine

## 2016-11-14 ENCOUNTER — Emergency Department
Admission: EM | Admit: 2016-11-14 | Discharge: 2016-11-14 | Disposition: A | Discharge status: Home / Self Care | Payer: Medicare Other | Attending: Emergency Medicine | Admitting: Emergency Medicine

## 2016-11-14 ENCOUNTER — Emergency Department: Payer: Medicare Other

## 2016-11-14 DIAGNOSIS — F0390 Unspecified dementia without behavioral disturbance: Secondary | ICD-10-CM | POA: Diagnosis not present

## 2016-11-14 DIAGNOSIS — E119 Type 2 diabetes mellitus without complications: Secondary | ICD-10-CM | POA: Insufficient documentation

## 2016-11-14 DIAGNOSIS — Z87891 Personal history of nicotine dependence: Secondary | ICD-10-CM | POA: Diagnosis not present

## 2016-11-14 DIAGNOSIS — J9811 Atelectasis: Secondary | ICD-10-CM | POA: Diagnosis not present

## 2016-11-14 DIAGNOSIS — R269 Unspecified abnormalities of gait and mobility: Secondary | ICD-10-CM | POA: Diagnosis not present

## 2016-11-14 DIAGNOSIS — Z79899 Other long term (current) drug therapy: Secondary | ICD-10-CM | POA: Diagnosis not present

## 2016-11-14 DIAGNOSIS — N390 Urinary tract infection, site not specified: Secondary | ICD-10-CM | POA: Diagnosis not present

## 2016-11-14 DIAGNOSIS — I10 Essential (primary) hypertension: Secondary | ICD-10-CM | POA: Diagnosis not present

## 2016-11-14 DIAGNOSIS — Z8673 Personal history of transient ischemic attack (TIA), and cerebral infarction without residual deficits: Secondary | ICD-10-CM | POA: Diagnosis not present

## 2016-11-14 DIAGNOSIS — R4182 Altered mental status, unspecified: Secondary | ICD-10-CM | POA: Diagnosis not present

## 2016-11-14 DIAGNOSIS — G4751 Confusional arousals: Secondary | ICD-10-CM | POA: Diagnosis present

## 2016-11-14 DIAGNOSIS — Z7984 Long term (current) use of oral hypoglycemic drugs: Secondary | ICD-10-CM | POA: Diagnosis not present

## 2016-11-14 LAB — URINALYSIS, COMPLETE (UACMP) WITH MICROSCOPIC
Bilirubin Urine: NEGATIVE
Ketones, ur: NEGATIVE mg/dL
Nitrite: NEGATIVE
PH: 5 (ref 5.0–8.0)
Protein, ur: NEGATIVE mg/dL
SPECIFIC GRAVITY, URINE: 1.023 (ref 1.005–1.030)

## 2016-11-14 LAB — TROPONIN I

## 2016-11-14 LAB — CBC
HCT: 42.9 % (ref 35.0–47.0)
Hemoglobin: 14.3 g/dL (ref 12.0–16.0)
MCH: 28.9 pg (ref 26.0–34.0)
MCHC: 33.3 g/dL (ref 32.0–36.0)
MCV: 86.8 fL (ref 80.0–100.0)
PLATELETS: 202 10*3/uL (ref 150–440)
RBC: 4.94 MIL/uL (ref 3.80–5.20)
RDW: 15 % — AB (ref 11.5–14.5)
WBC: 7 10*3/uL (ref 3.6–11.0)

## 2016-11-14 LAB — COMPREHENSIVE METABOLIC PANEL
ALBUMIN: 4 g/dL (ref 3.5–5.0)
ALT: 14 U/L (ref 14–54)
ANION GAP: 10 (ref 5–15)
AST: 25 U/L (ref 15–41)
Alkaline Phosphatase: 67 U/L (ref 38–126)
BUN: 14 mg/dL (ref 6–20)
CHLORIDE: 105 mmol/L (ref 101–111)
CO2: 23 mmol/L (ref 22–32)
Calcium: 9.6 mg/dL (ref 8.9–10.3)
Creatinine, Ser: 0.75 mg/dL (ref 0.44–1.00)
GFR calc Af Amer: >60 mL/min (ref >60)
Glucose, Bld: 260 mg/dL — ABNORMAL HIGH (ref 65–99)
POTASSIUM: 3.6 mmol/L (ref 3.5–5.1)
Sodium: 138 mmol/L (ref 135–145)
Total Bilirubin: 1 mg/dL (ref 0.3–1.2)
Total Protein: 7.6 g/dL (ref 6.5–8.1)

## 2016-11-14 LAB — PROTIME-INR
INR: 0.88
PROTHROMBIN TIME: 11.9 s (ref 11.4–15.2)

## 2016-11-14 LAB — APTT: APTT: 29 s (ref 24–36)

## 2016-11-14 MED ORDER — CEPHALEXIN 500 MG PO CAPS: 500.0000 mg | ORAL_CAPSULE | Freq: Two times a day (BID) | ORAL | 0 refills | Status: DC

## 2016-11-14 MED ORDER — DEXTROSE 5 % IV SOLN
1.0000 g | Freq: Once | INTRAVENOUS | Status: AC
  Administered 2016-11-14: 1 g via INTRAVENOUS
  Filled 2016-11-14: qty 10

## 2016-11-14 NOTE — ED Notes (Signed)
This RN contacted pt son Wynona Duhamel and informed him that his mother was here being treated for a UTI. Son states he will be here shortly

## 2016-11-14 NOTE — ED Provider Notes (Signed)
[facility] Emergency Department Provider Note   ____________________________________________    I have reviewed the triage vital signs and the nursing notes.   HISTORY  Chief Complaint Altered Mental Status and Fall   History limited by confusion  HPI Lisa Hahn is a 76 y.o. female who presents with confusion. Per EMS patient reportedly found wandering outside. Patient tells me that she was trying to go to the bank however this was apparently at approximately 5 AM, she is confused at what time of day it is. She denies headache, motor weakness. Moves all extremities well.review of medical records demonstrates history of diabetes hypertension and CVA in the past   Past Medical History:  Diagnosis Date  . Arthritis   . Diabetes mellitus without complication ([place])   . Hypertension   . Stroke [facility])     Patient Active Problem List   Diagnosis Date Noted  . Decreased pedal pulses 08/22/2016  . Hyperlipidemia 08/22/2016  . Poor dentition 04/16/2016  . Abdominal pain 04/16/2016  . Onychomycosis 04/16/2016  . Lightheadedness 08/15/2015  . Abdominal pain, chronic, epigastric 08/15/2015  . Dementia 07/25/2015  . Chest pain 06/28/2015  . Strain of left quadriceps tendon 06/28/2015  . Type 2 diabetes mellitus ([place]) 06/28/2015  . Vision changes 06/28/2015    Past Surgical History:  Procedure Laterality Date  . ABDOMINAL HYSTERECTOMY    . ABDOMINAL SURGERY     blockage     Prior to Admission medications   Medication Sig Start Date End Date Taking? Authorizing Provider  atorvastatin (LIPITOR) 80 MG tablet TAKE 1 TABLET (80 MG TOTAL) BY MOUTH DAILY. 10/22/16  Yes Leone Haven, MD  empagliflozin (JARDIANCE) 10 MG TABS tablet Take 10 mg by mouth daily.   Yes [provider]  metFORMIN (GLUCOPHAGE) 1000 MG tablet TAKE 1 TABLET (1,000 MG TOTAL) BY MOUTH 2 (TWO) TIMES DAILY WITH A MEAL. 10/22/16  Yes Leone Haven, MD  sitaGLIPtin  (JANUVIA) 100 MG tablet Take 1 tablet (100 mg total) by mouth daily. 08/27/16  Yes Leone Haven, MD  acetaminophen-codeine (TYLENOL #3) 300-30 MG tablet Take 1 tablet by mouth every 6 (six) hours as needed for moderate pain. Patient not taking: Reported on 11/14/2016 08/15/15   Leone Haven, MD  cephALEXin (KEFLEX) 500 MG capsule Take 1 capsule (500 mg total) by mouth 2 (two) times daily. 11/14/16   Lavonia Drafts, MD  meloxicam (MOBIC) 15 MG tablet meloxicam 15 mg tablet  TAKE 1 TABLET BY MOUTH EVERY DAY WITH MEALS    [provider]     Allergies Aspirin and Motrin [ibuprofen]  Family History  Problem Relation Age of Onset  . Arthritis Unknown        Parent    Social History Social History  Substance Use Topics  . Smoking status: Former Research scientist (life sciences)  . Smokeless tobacco: Never Used  . Alcohol use No    Review of Systems imited by confusion  Constitutional: denies fever Eyes: denies visual changes.  ENT: denies neck pain Cardiovascular: Denies chest pain. Respiratory: Denies shortness of breath. Gastrointestinal: No abdominal pain.  No nausea, no vomiting.   Genitourinary: Negative for dysuria.reports urinary frequency Musculoskeletal: Negative for back pain. Skin: Negative for rash. Neurological: denies headaches or weakness   ____________________________________________   PHYSICAL EXAM:  VITAL SIGNS: ED Triage Vitals [11/14/16 0656]  Enc Vitals Group     BP (!) 156/115     Pulse Rate (!) 107  Resp 17     Temp 98.2 F (36.8 C)     Temp Source Oral     SpO2 97 %     Weight 72.6 kg (160 lb)     Height 1.575 m (5\' 2" )     Head Circumference      Peak Flow      Pain Score      Pain Loc      Pain Edu?      Excl. in [place]?     Constitutional: Alert but confused. Disoriented to time and date. No acute distress.  Eyes: Conjunctivae are normal. PERRLA, EOMI Head: Atraumatic. Nose: No congestion/rhinnorhea. Mouth/Throat: Mucous membranes are  moist.   Neck:  Painless ROM, no vertebral tenderness to palpation Cardiovascular: mild tachycardia, regular rhythm. Grossly normal heart sounds.  Good peripheral circulation. Respiratory: Normal respiratory effort.  No retractions. Lungs CTAB. Gastrointestinal: Soft and nontender. No distention.  No CVA tenderness. Genitourinary: deferred Musculoskeletal:moves all extremities without pain. Warm and well perfused Neurologic:  Normal speech and language. No gross focal neurologic deficits are appreciated. Cranial nerves II through XII are normal. Normal strength in all extremities Skin:  Skin is warm, dry and intact. No rash noted. Psychiatric: Mood and affect are normal. Speech and behavior are normal.  ____________________________________________   LABS (all labs ordered are listed, but only abnormal results are displayed)  Labs Reviewed  CBC - Abnormal; Notable for the following:       Result Value   RDW 15.0 (*)    All other components within normal limits  COMPREHENSIVE METABOLIC PANEL - Abnormal; Notable for the following:    Glucose, Bld 260 (*)    All other components within normal limits  URINALYSIS, COMPLETE (UACMP) WITH MICROSCOPIC - Abnormal; Notable for the following:    Color, Urine YELLOW (*)    APPearance HAZY (*)    Glucose, UA >=500 (*)    Hgb urine dipstick SMALL (*)    Leukocytes, UA SMALL (*)    Bacteria, UA MANY (*)    Squamous Epithelial / LPF 0-5 (*)    All other components within normal limits  URINE CULTURE  TROPONIN I  APTT  PROTIME-INR   ____________________________________________  EKG  ED ECG REPORT I, Lavonia Drafts, the attending physician, personally viewed and interpreted this ECG.  Date: 11/14/2016  Rate: 108 Rhythm: normal sinus rhythm QRS Axis: normal Intervals: normal ST/T Wave abnormalities: normal Narrative Interpretation: unremarkable  ____________________________________________  RADIOLOGY  Chest x-ray  unremarkable ____________________________________________   PROCEDURES  Procedure(s) performed: No    Critical Care performed: No ____________________________________________   INITIAL IMPRESSION / ASSESSMENT AND PLAN / ED COURSE  Pertinent labs & imaging results that were available during my care of the patient were reviewed by me and considered in my medical decision making (see chart for details).  Patient presents with confusion and wandering outside of her home. Review of records demonstrates a similar episode over a year ago because of worsening dementia.I suspect that may be the case today as well, we will check labs, CT, urinalysis and attempt to contact family  Workup is reassuring, mild urinary tract infection may have worsened her mental status. However son is here and says she is at her baseline. He plans to work on getting her into a nursing home which I think is appropriate. No indication for admission at this time. We'll discharge on antibiotics    ____________________________________________   FINAL CLINICAL IMPRESSION(S) / ED DIAGNOSES  Final diagnoses:  Lower urinary tract infectious disease  Dementia without behavioral disturbance, unspecified dementia type      NEW MEDICATIONS STARTED DURING THIS VISIT:  Discharge Medication List as of 11/14/2016 10:41 AM    START taking these medications   Details  cephALEXin (KEFLEX) 500 MG capsule Take 1 capsule (500 mg total) by mouth 2 (two) times daily., Starting Fri 11/14/2016, Print         Note:  This document was prepared using Dragon voice recognition software and may include unintentional dictation errors.    Lavonia Drafts, MD 11/14/16 [phone]

## 2016-11-14 NOTE — ED Notes (Signed)
ED Provider at bedside. 

## 2016-11-14 NOTE — ED Triage Notes (Signed)
Per EMS: patient found walking in the street on way to the bank per patient. Pt confused, disoriented to time.  Pt reports she believed she was being followed.  Pt had episode of incontinence.  Pt reports hx of stroke.  Pt oriented to person, place and situation.  Pt reported incorrect month (may), and incorrect year (1978), however was able to identify Trump as president.

## 2016-11-14 NOTE — ED Notes (Signed)
Pt taken to ct at this time.

## 2016-11-14 NOTE — ED Notes (Signed)
Attempted to contact son Linton Rump with no success.

## 2016-11-14 NOTE — ED Notes (Signed)
Pt alert  active, cooperative, pt in NAD. RR even and unlabored, color WNL.  Pt informed to return if any life threatening symptoms occur.  Pt left with son.

## 2016-11-14 NOTE — ED Notes (Signed)
Pt was given some cranberry juice and crackers to eat.

## 2016-11-16 LAB — URINE CULTURE

## 2016-11-23 ENCOUNTER — Encounter: Payer: Self-pay | Admitting: Emergency Medicine

## 2016-11-23 ENCOUNTER — Emergency Department
Admission: EM | Admit: 2016-11-23 | Discharge: 2016-11-23 | Disposition: A | Discharge status: Home / Self Care | Payer: Medicare Other | Attending: Emergency Medicine | Admitting: Emergency Medicine

## 2016-11-23 DIAGNOSIS — H6593 Unspecified nonsuppurative otitis media, bilateral: Secondary | ICD-10-CM | POA: Diagnosis not present

## 2016-11-23 DIAGNOSIS — R51 Headache: Secondary | ICD-10-CM | POA: Diagnosis present

## 2016-11-23 DIAGNOSIS — Z87891 Personal history of nicotine dependence: Secondary | ICD-10-CM | POA: Insufficient documentation

## 2016-11-23 DIAGNOSIS — G44049 Chronic paroxysmal hemicrania, not intractable: Secondary | ICD-10-CM | POA: Insufficient documentation

## 2016-11-23 DIAGNOSIS — Z79899 Other long term (current) drug therapy: Secondary | ICD-10-CM | POA: Insufficient documentation

## 2016-11-23 DIAGNOSIS — Z7984 Long term (current) use of oral hypoglycemic drugs: Secondary | ICD-10-CM | POA: Diagnosis not present

## 2016-11-23 DIAGNOSIS — E119 Type 2 diabetes mellitus without complications: Secondary | ICD-10-CM | POA: Diagnosis not present

## 2016-11-23 DIAGNOSIS — I10 Essential (primary) hypertension: Secondary | ICD-10-CM | POA: Insufficient documentation

## 2016-11-23 LAB — CBC WITH DIFFERENTIAL/PLATELET
BASOS ABS: 0 10*3/uL (ref 0–0.1)
BASOS PCT: 1 %
EOS PCT: 2 %
Eosinophils Absolute: 0.2 10*3/uL (ref 0–0.7)
HCT: 43.5 % (ref 35.0–47.0)
Hemoglobin: 14.8 g/dL (ref 12.0–16.0)
Lymphocytes Relative: 40 %
Lymphs Abs: 2.8 10*3/uL (ref 1.0–3.6)
MCH: 29.8 pg (ref 26.0–34.0)
MCHC: 34.1 g/dL (ref 32.0–36.0)
MCV: 87.6 fL (ref 80.0–100.0)
MONO ABS: 0.5 10*3/uL (ref 0.2–0.9)
Monocytes Relative: 7 %
Neutro Abs: 3.5 10*3/uL (ref 1.4–6.5)
Neutrophils Relative %: 50 %
PLATELETS: 202 10*3/uL (ref 150–440)
RBC: 4.97 MIL/uL (ref 3.80–5.20)
RDW: 15.1 % — AB (ref 11.5–14.5)
WBC: 7.1 10*3/uL (ref 3.6–11.0)

## 2016-11-23 LAB — BASIC METABOLIC PANEL
Anion gap: 9 (ref 5–15)
BUN: 12 mg/dL (ref 6–20)
CO2: 27 mmol/L (ref 22–32)
CREATININE: 0.79 mg/dL (ref 0.44–1.00)
Calcium: 9.4 mg/dL (ref 8.9–10.3)
Chloride: 100 mmol/L — ABNORMAL LOW (ref 101–111)
GFR calc Af Amer: >60 mL/min (ref >60)
Glucose, Bld: 275 mg/dL — ABNORMAL HIGH (ref 65–99)
Potassium: 4 mmol/L (ref 3.5–5.1)
SODIUM: 136 mmol/L (ref 135–145)

## 2016-11-23 LAB — SEDIMENTATION RATE: SED RATE: 5 mm/h (ref 0–30)

## 2016-11-23 MED ORDER — METOCLOPRAMIDE HCL 10 MG PO TABS
10.0000 mg | ORAL_TABLET | Freq: Once | ORAL | Status: AC
  Administered 2016-11-23: 10 mg via ORAL
  Filled 2016-11-23: qty 1

## 2016-11-23 MED ORDER — METOCLOPRAMIDE HCL 10 MG PO TABS: 10.0000 mg | ORAL_TABLET | Freq: Four times a day (QID) | ORAL | 0 refills | Status: DC | PRN

## 2016-11-23 MED ORDER — KETOROLAC TROMETHAMINE 60 MG/2ML IM SOLN
15.0000 mg | Freq: Once | INTRAMUSCULAR | Status: AC
  Administered 2016-11-23: 15 mg via INTRAMUSCULAR
  Filled 2016-11-23: qty 2

## 2016-11-23 MED ORDER — AZITHROMYCIN 250 MG PO TABS
ORAL_TABLET | ORAL | 0 refills | Status: DC
Start: 2016-11-23 — End: 2017-01-27

## 2016-11-23 MED ORDER — ACETAMINOPHEN 325 MG PO TABS: 650.0000 mg | ORAL_TABLET | Freq: Four times a day (QID) | ORAL | 0 refills | Status: DC | PRN

## 2016-11-23 NOTE — Progress Notes (Signed)
Chaplain initiated visit with Lisa Hahn son was present; employee of [employer]). Upon arrival [name] stated "Please prayer for me." [name] expressed frustration with ongoing health issues stating "I am tired." Chaplain explored physical, emotional, and spiritual concerns. Chaplain offered listening presence, emotional support, and prayer. Chaplain offered education related to Advance Directive. Chaplain available for additional [place] if request.

## 2016-11-23 NOTE — Discharge Instructions (Signed)
Your labs today were unremarkable.  Follow up with your doctor for continued management of your headache. Continue taking all your antibiotics until finished.

## 2016-11-23 NOTE — ED Provider Notes (Signed)
[facility] Emergency Department Provider Note  ____________________________________________  Time seen: Approximately 3:45 PM  I have reviewed the triage vital signs and the nursing notes.   HISTORY  Chief Complaint Headache  Level 5 Caveat: Portions of the History and Physical are unable to be obtained due to patient being a poor historian Further history obtained from son at bedside  HPI Lisa Hahn is a 76 y.o. female who complains of pain at the right temporal area radiating around to the back of the head for the past month. It's intermittent, sharp and severe when present. Not associated with vision changes numbness tingling or weakness. Did have a fall within the last month, none in the past few days. Was seen in the ED about 9 days ago, had a CT scan of the head which was negative, diagnosed with a UTI for which she is currently taking Keflex. Urine culture shows the infection to be pansensitive. No new trauma fevers chills night pain or stiffness.    Past Medical History:  Diagnosis Date  . Arthritis   . Diabetes mellitus without complication ([place])   . Hypertension   . Stroke [facility])      Patient Active Problem List   Diagnosis Date Noted  . Decreased pedal pulses 08/22/2016  . Hyperlipidemia 08/22/2016  . Poor dentition 04/16/2016  . Abdominal pain 04/16/2016  . Onychomycosis 04/16/2016  . Lightheadedness 08/15/2015  . Abdominal pain, chronic, epigastric 08/15/2015  . Dementia 07/25/2015  . Chest pain 06/28/2015  . Strain of left quadriceps tendon 06/28/2015  . Type 2 diabetes mellitus ([place]) 06/28/2015  . Vision changes 06/28/2015     Past Surgical History:  Procedure Laterality Date  . ABDOMINAL HYSTERECTOMY    . ABDOMINAL SURGERY     blockage      Prior to Admission medications   Medication Sig Start Date End Date Taking? Authorizing Provider  acetaminophen (TYLENOL) 325 MG tablet Take 2 tablets (650 mg total) by mouth every 6  (six) hours as needed. 11/23/16   Carrie Mew, MD  acetaminophen-codeine (TYLENOL #3) 300-30 MG tablet Take 1 tablet by mouth every 6 (six) hours as needed for moderate pain. Patient not taking: Reported on 11/14/2016 08/15/15   Leone Haven, MD  atorvastatin (LIPITOR) 80 MG tablet TAKE 1 TABLET (80 MG TOTAL) BY MOUTH DAILY. 10/22/16   Leone Haven, MD  azithromycin (ZITHROMAX Z-PAK) 250 MG tablet Take 2 tablets (500 mg) on  Day 1,  followed by 1 tablet (250 mg) once daily on Days 2 through 5. 11/23/16   Carrie Mew, MD  cephALEXin (KEFLEX) 500 MG capsule Take 1 capsule (500 mg total) by mouth 2 (two) times daily. 11/14/16   Lavonia Drafts, MD  empagliflozin (JARDIANCE) 10 MG TABS tablet Take 10 mg by mouth daily.    [provider]  meloxicam (MOBIC) 15 MG tablet meloxicam 15 mg tablet  TAKE 1 TABLET BY MOUTH EVERY DAY WITH MEALS    [provider]  metFORMIN (GLUCOPHAGE) 1000 MG tablet TAKE 1 TABLET (1,000 MG TOTAL) BY MOUTH 2 (TWO) TIMES DAILY WITH A MEAL. 10/22/16   Leone Haven, MD  metoCLOPramide (REGLAN) 10 MG tablet Take 1 tablet (10 mg total) by mouth every 6 (six) hours as needed. 11/23/16   Carrie Mew, MD  sitaGLIPtin (JANUVIA) 100 MG tablet Take 1 tablet (100 mg total) by mouth daily. 08/27/16   Leone Haven, MD     Allergies Aspirin and Motrin [ibuprofen]  Family History  Problem Relation Age of Onset  . Arthritis Unknown        Parent    Social History Social History  Substance Use Topics  . Smoking status: Former Research scientist (life sciences)  . Smokeless tobacco: Never Used  . Alcohol use No    Review of Systems  Constitutional:   No fever or chills.  ENT:   No sore throat. No rhinorrhea. Cardiovascular:   No chest pain or syncope. Respiratory:   No dyspnea or cough. Gastrointestinal:   Negative for abdominal pain, vomiting and diarrhea.  Musculoskeletal:   Negative for focal pain or swelling All other systems reviewed and are  negative except as documented above in ROS and HPI.  ____________________________________________   PHYSICAL EXAM:  VITAL SIGNS: ED Triage Vitals [11/23/16 1243]  Enc Vitals Group     BP (!) 171/84     Pulse Rate 82     Resp 16     Temp 98.7 F (37.1 C)     Temp Source Oral     SpO2 99 %     Weight 153 lb (69.4 kg)     Height 5\' 3"  (1.6 m)     Head Circumference      Peak Flow      Pain Score 10     Pain Loc      Pain Edu?      Excl. in [place]?     Vital signs reviewed, nursing assessments reviewed.   Constitutional:   Alert and oriented. Well appearing and in no distress. Eyes:   No scleral icterus.  EOMI. No nystagmus. No conjunctival pallor. PERRL. ENT   Head:   Normocephalic and atraumatic.Bulging erythematous TMs bilaterally, right greater than left without perforation. Temporal arteries nontender with good pulsations.   Nose:   No congestion/rhinnorhea.    Mouth/Throat:   MMM, no pharyngeal erythema. No peritonsillar mass.    Neck:   No meningismus. Full ROM Hematological/Lymphatic/Immunilogical:   No cervical lymphadenopathy. Cardiovascular:   RRR. Symmetric bilateral radial and DP pulses.  No murmurs.  Respiratory:   Normal respiratory effort without tachypnea/retractions. Breath sounds are clear and equal bilaterally. No wheezes/rales/rhonchi. Gastrointestinal:   Soft and nontender. Non distended. There is no CVA tenderness.  No rebound, rigidity, or guarding. Genitourinary:   deferred Musculoskeletal:   Normal range of motion in all extremities. No joint effusions.  No lower extremity tenderness.  No edema. Neurologic:   Normal speech and language.  Cranial nerves II through XII intact Motor grossly intact. No gross focal neurologic deficits are appreciated.  Skin:    Skin is warm, dry and intact. No rash noted.  No petechiae, purpura, or bullae.  ____________________________________________    LABS (pertinent positives/negatives) (all labs  ordered are listed, but only abnormal results are displayed) Labs Reviewed  BASIC METABOLIC PANEL - Abnormal; Notable for the following:       Result Value   Chloride 100 (*)    Glucose, Bld 275 (*)    All other components within normal limits  CBC WITH DIFFERENTIAL/PLATELET - Abnormal; Notable for the following:    RDW 15.1 (*)    All other components within normal limits  SEDIMENTATION RATE  URINALYSIS, COMPLETE (UACMP) WITH MICROSCOPIC   ____________________________________________   EKG    ____________________________________________    RADIOLOGY  No results found.  ____________________________________________   PROCEDURES Procedures  ____________________________________________   INITIAL IMPRESSION / ASSESSMENT AND PLAN / ED COURSE  Pertinent labs & imaging results that were  available during my care of the patient were reviewed by me and considered in my medical decision making (see chart for details).  Patient well appearing no acute distress, presents for reevaluation of chronic headache, recently had a CT scan of her head which was unremarkable. Has a history of dementia as well. Possibly related to prior minor trauma from a fall versus medication overuse. Otherwise benign pattern, low suspicion for meningitis encephalitis intracranial hypertension stroke glaucoma temporal arteritis or vascular occlusion. Follow-up with primary care. We'll add azithromycin to the Keflex she started taking for otitis media which may be contributing to her symptoms as well.      ____________________________________________   FINAL CLINICAL IMPRESSION(S) / ED DIAGNOSES  Final diagnoses:  Chronic paroxysmal hemicrania, not intractable  Other nonsuppurative otitis media of both ears, unspecified chronicity      New Prescriptions   ACETAMINOPHEN (TYLENOL) 325 MG TABLET    Take 2 tablets (650 mg total) by mouth every 6 (six) hours as needed.   AZITHROMYCIN (ZITHROMAX  Z-PAK) 250 MG TABLET    Take 2 tablets (500 mg) on  Day 1,  followed by 1 tablet (250 mg) once daily on Days 2 through 5.   METOCLOPRAMIDE (REGLAN) 10 MG TABLET    Take 1 tablet (10 mg total) by mouth every 6 (six) hours as needed.     Portions of this note were generated with dragon dictation software. Dictation errors may occur despite best attempts at proofreading.    Carrie Mew, MD 11/23/16 [phone]

## 2016-11-23 NOTE — ED Triage Notes (Signed)
Pt states that she has been having pain in the right side of her head for several weeks. Pt states that she has been seen 4 times recently for the same complaint. Pt states that the pain comes and goes. Pt states that she is tired of hurting and is tired of running to the hospital and needs something to help with the pain. Pt A  & O x 4 in triage

## 2016-12-30 ENCOUNTER — Emergency Department
Admission: EM | Admit: 2016-12-30 | Discharge: 2016-12-30 | Disposition: A | Discharge status: Home / Self Care | Payer: Medicare Other | Attending: Emergency Medicine | Admitting: Emergency Medicine

## 2016-12-30 ENCOUNTER — Encounter: Payer: Self-pay | Admitting: Emergency Medicine

## 2016-12-30 DIAGNOSIS — B3731 Acute candidiasis of vulva and vagina: Secondary | ICD-10-CM

## 2016-12-30 DIAGNOSIS — Z87891 Personal history of nicotine dependence: Secondary | ICD-10-CM | POA: Insufficient documentation

## 2016-12-30 DIAGNOSIS — Z8673 Personal history of transient ischemic attack (TIA), and cerebral infarction without residual deficits: Secondary | ICD-10-CM | POA: Diagnosis not present

## 2016-12-30 DIAGNOSIS — N390 Urinary tract infection, site not specified: Secondary | ICD-10-CM | POA: Diagnosis not present

## 2016-12-30 DIAGNOSIS — B373 Candidiasis of vulva and vagina: Secondary | ICD-10-CM

## 2016-12-30 DIAGNOSIS — E119 Type 2 diabetes mellitus without complications: Secondary | ICD-10-CM | POA: Insufficient documentation

## 2016-12-30 DIAGNOSIS — F0390 Unspecified dementia without behavioral disturbance: Secondary | ICD-10-CM | POA: Diagnosis not present

## 2016-12-30 DIAGNOSIS — Z7984 Long term (current) use of oral hypoglycemic drugs: Secondary | ICD-10-CM | POA: Diagnosis not present

## 2016-12-30 DIAGNOSIS — Z791 Long term (current) use of non-steroidal anti-inflammatories (NSAID): Secondary | ICD-10-CM | POA: Diagnosis not present

## 2016-12-30 DIAGNOSIS — R1030 Lower abdominal pain, unspecified: Secondary | ICD-10-CM | POA: Diagnosis not present

## 2016-12-30 DIAGNOSIS — Z79899 Other long term (current) drug therapy: Secondary | ICD-10-CM | POA: Insufficient documentation

## 2016-12-30 DIAGNOSIS — I10 Essential (primary) hypertension: Secondary | ICD-10-CM | POA: Diagnosis not present

## 2016-12-30 DIAGNOSIS — R109 Unspecified abdominal pain: Secondary | ICD-10-CM

## 2016-12-30 LAB — CHLAMYDIA/NGC RT PCR (ARMC ONLY)
Chlamydia Tr: NOT DETECTED
N GONORRHOEAE: NOT DETECTED

## 2016-12-30 LAB — URINALYSIS, COMPLETE (UACMP) WITH MICROSCOPIC
Bilirubin Urine: NEGATIVE
Glucose, UA: 50 mg/dL — AB
KETONES UR: NEGATIVE mg/dL
Nitrite: NEGATIVE
PH: 6 (ref 5.0–8.0)
PROTEIN: NEGATIVE mg/dL
SQUAMOUS EPITHELIAL / LPF: NONE SEEN
Specific Gravity, Urine: 1.015 (ref 1.005–1.030)

## 2016-12-30 LAB — COMPREHENSIVE METABOLIC PANEL
ALBUMIN: 3.9 g/dL (ref 3.5–5.0)
ALT: 14 U/L (ref 14–54)
AST: 21 U/L (ref 15–41)
Alkaline Phosphatase: 63 U/L (ref 38–126)
Anion gap: 7 (ref 5–15)
BUN: 12 mg/dL (ref 6–20)
CHLORIDE: 107 mmol/L (ref 101–111)
CO2: 27 mmol/L (ref 22–32)
CREATININE: 0.55 mg/dL (ref 0.44–1.00)
Calcium: 9.2 mg/dL (ref 8.9–10.3)
GFR calc non Af Amer: >60 mL/min (ref >60)
GLUCOSE: 155 mg/dL — AB (ref 65–99)
Potassium: 4 mmol/L (ref 3.5–5.1)
SODIUM: 141 mmol/L (ref 135–145)
Total Bilirubin: 0.5 mg/dL (ref 0.3–1.2)
Total Protein: 7.2 g/dL (ref 6.5–8.1)

## 2016-12-30 LAB — WET PREP, GENITAL
Clue Cells Wet Prep HPF POC: NONE SEEN
SPERM: NONE SEEN
Trich, Wet Prep: NONE SEEN

## 2016-12-30 LAB — CBC
HCT: 42.1 % (ref 35.0–47.0)
Hemoglobin: 14.1 g/dL (ref 12.0–16.0)
MCH: 29.7 pg (ref 26.0–34.0)
MCHC: 33.5 g/dL (ref 32.0–36.0)
MCV: 88.7 fL (ref 80.0–100.0)
PLATELETS: 207 10*3/uL (ref 150–440)
RBC: 4.75 MIL/uL (ref 3.80–5.20)
RDW: 15.1 % — ABNORMAL HIGH (ref 11.5–14.5)
WBC: 7 10*3/uL (ref 3.6–11.0)

## 2016-12-30 LAB — LIPASE, BLOOD: LIPASE: 53 U/L — AB (ref 11–51)

## 2016-12-30 LAB — GLUCOSE, CAPILLARY: Glucose-Capillary: 154 mg/dL — ABNORMAL HIGH (ref 65–99)

## 2016-12-30 MED ORDER — FLUCONAZOLE 50 MG PO TABS
150.0000 mg | ORAL_TABLET | Freq: Once | ORAL | Status: AC
  Administered 2016-12-30: 150 mg via ORAL
  Filled 2016-12-30: qty 3

## 2016-12-30 MED ORDER — CEPHALEXIN 500 MG PO CAPS
500.0000 mg | ORAL_CAPSULE | Freq: Once | ORAL | Status: AC
  Administered 2016-12-30: 500 mg via ORAL
  Filled 2016-12-30: qty 1

## 2016-12-30 MED ORDER — CEPHALEXIN 500 MG PO CAPS: 500.0000 mg | ORAL_CAPSULE | Freq: Three times a day (TID) | ORAL | 0 refills | Status: AC

## 2016-12-30 NOTE — ED Notes (Signed)
Pt reports pain in right sided started last pm suddenly. Pt reports pain radiates around to the front. Pt reports issues for a month. Pt reports that she is also diabetic and wears pads to keep from urinating. Pt reports several UTI's recently and wonders if this is another infection. Pt reports feels like she has to urinate frequently. Pt son at the bedside reports pt also with hx of dementia. Denies fevers.

## 2016-12-30 NOTE — ED Notes (Signed)
Son is present for discharge.

## 2016-12-30 NOTE — ED Triage Notes (Signed)
Pt c/o lower abdominal pain with some vomiting for 2 weeks. Denies diarrhea or fevers.  Also has had heavy vaginal discharge but unsure of color.  NAD. VSS.  "i am not peeing normal", pt unable to elaborate on this.

## 2016-12-30 NOTE — ED Provider Notes (Addendum)
[facility] Emergency Department Provider Note   ____________________________________________   None    (approximate)  I have reviewed the triage vital signs and the nursing notes.   HISTORY  Chief Complaint Abdominal Pain    HPI Lisa Hahn is a 76 y.o. female with a history of dementia who is presenting to the emergency department with lower abdominal cramping that is been intermittent since this past May. She is also reporting frequent urination without any burning. Also reporting a vaginal discharge. The patient is here with her son. The patient says that she is sexually active but the son says that she has not. The son also says that she thinks her husband and is still alive and he had died in 30-Jul-2007. The patient is denying any back pain. Said that she vomited once back in May but has not vomited recently. Not reporting any diarrhea. Patient denies any pain at this time. Son says that the patient did not eat breakfast or lunch today but ate a full dinner last night.the patient's son reports that she is at her baseline level of mentation at this time.   Past Medical History:  Diagnosis Date  . Arthritis   . Diabetes mellitus without complication ([place])   . Hypertension   . Stroke [facility])     Patient Active Problem List   Diagnosis Date Noted  . Decreased pedal pulses 08/22/2016  . Hyperlipidemia 08/22/2016  . Poor dentition 04/16/2016  . Abdominal pain 04/16/2016  . Onychomycosis 04/16/2016  . Lightheadedness 08/15/2015  . Abdominal pain, chronic, epigastric 08/15/2015  . Dementia 07/25/2015  . Chest pain 06/28/2015  . Strain of left quadriceps tendon 06/28/2015  . Type 2 diabetes mellitus ([place]) 06/28/2015  . Vision changes 06/28/2015    Past Surgical History:  Procedure Laterality Date  . ABDOMINAL HYSTERECTOMY    . ABDOMINAL SURGERY     blockage     Prior to Admission medications   Medication Sig Start Date End Date Taking? Authorizing  Provider  acetaminophen (TYLENOL) 325 MG tablet Take 2 tablets (650 mg total) by mouth every 6 (six) hours as needed. 11/23/16   Carrie Mew, MD  acetaminophen-codeine (TYLENOL #3) 300-30 MG tablet Take 1 tablet by mouth every 6 (six) hours as needed for moderate pain. Patient not taking: Reported on 11/14/2016 08/15/15   Leone Haven, MD  atorvastatin (LIPITOR) 80 MG tablet TAKE 1 TABLET (80 MG TOTAL) BY MOUTH DAILY. 10/22/16   Leone Haven, MD  azithromycin (ZITHROMAX Z-PAK) 250 MG tablet Take 2 tablets (500 mg) on  Day 1,  followed by 1 tablet (250 mg) once daily on Days 2 through 5. 11/23/16   Carrie Mew, MD  cephALEXin (KEFLEX) 500 MG capsule Take 1 capsule (500 mg total) by mouth 2 (two) times daily. 11/14/16   Lavonia Drafts, MD  empagliflozin (JARDIANCE) 10 MG TABS tablet Take 10 mg by mouth daily.    [provider]  meloxicam (MOBIC) 15 MG tablet meloxicam 15 mg tablet  TAKE 1 TABLET BY MOUTH EVERY DAY WITH MEALS    [provider]  metFORMIN (GLUCOPHAGE) 1000 MG tablet TAKE 1 TABLET (1,000 MG TOTAL) BY MOUTH 2 (TWO) TIMES DAILY WITH A MEAL. 10/22/16   Leone Haven, MD  metoCLOPramide (REGLAN) 10 MG tablet Take 1 tablet (10 mg total) by mouth every 6 (six) hours as needed. 11/23/16   Carrie Mew, MD  sitaGLIPtin (JANUVIA) 100 MG tablet Take 1 tablet (100 mg total) by  mouth daily. 08/27/16   Leone Haven, MD    Allergies Aspirin and Motrin [ibuprofen]  Family History  Problem Relation Age of Onset  . Arthritis Unknown        Parent    Social History Social History  Substance Use Topics  . Smoking status: Former Research scientist (life sciences)  . Smokeless tobacco: Never Used  . Alcohol use No    Review of Systems  Constitutional: No fever/chills Eyes: No visual changes. ENT: No sore throat. Cardiovascular: Denies chest pain. Respiratory: Denies shortness of breath. Gastrointestinal: No diarrhea.  No constipation. Genitourinary: as  above Musculoskeletal: Negative for back pain. Skin: Negative for rash. Neurological: Negative for headaches, focal weakness or numbness.   ____________________________________________   PHYSICAL EXAM:  VITAL SIGNS: ED Triage Vitals  Enc Vitals Group     BP 12/30/16 1253 (!) 163/74     Pulse Rate 12/30/16 1253 81     Resp 12/30/16 1253 20     Temp 12/30/16 1253 98.8 F (37.1 C)     Temp Source 12/30/16 1253 Oral     SpO2 12/30/16 1253 98 %     Weight 12/30/16 1254 153 lb (69.4 kg)     Height 12/30/16 1254 5\' 2"  (1.575 m)     Head Circumference --      Peak Flow --      Pain Score 12/30/16 1255 10     Pain Loc --      Pain Edu? --      Excl. in [place]? --     Constitutional: Alert and oriented. Well appearing and in no acute distress. Eyes: Conjunctivae are normal.  Head: Atraumatic. Nose: No congestion/rhinnorhea. Mouth/Throat: Mucous membranes are moist.  Neck: No stridor.   Cardiovascular: Normal rate, regular rhythm. Grossly normal heart sounds Respiratory: Normal respiratory effort.  No retractions. Lungs CTAB. Gastrointestinal: Soft and nontender. No distention. No CVA tenderness. Genitourinary:  normal external exam without any lesions. Speculum exam with minimal to no discharge. Bimanual exam without cervical motion tenderness. No uterine nor ovary masses or tenderness.  Musculoskeletal: No lower extremity tenderness nor edema.  No joint effusions. Neurologic:  Normal speech and language. No gross focal neurologic deficits are appreciated. Skin:  Skin is warm, dry and intact. No rash noted. Psychiatric: Mood and affect are normal. Speech and behavior are normal.  ____________________________________________   LABS (all labs ordered are listed, but only abnormal results are displayed)  Labs Reviewed  WET PREP, GENITAL - Abnormal; Notable for the following:       Result Value   Yeast Wet Prep HPF POC PRESENT (*)    WBC, Wet Prep HPF POC RARE (*)    All other  components within normal limits  LIPASE, BLOOD - Abnormal; Notable for the following:    Lipase 53 (*)    All other components within normal limits  COMPREHENSIVE METABOLIC PANEL - Abnormal; Notable for the following:    Glucose, Bld 155 (*)    All other components within normal limits  CBC - Abnormal; Notable for the following:    RDW 15.1 (*)    All other components within normal limits  URINALYSIS, COMPLETE (UACMP) WITH MICROSCOPIC - Abnormal; Notable for the following:    Color, Urine YELLOW (*)    APPearance CLEAR (*)    Glucose, UA 50 (*)    Hgb urine dipstick SMALL (*)    Leukocytes, UA MODERATE (*)    Bacteria, UA RARE (*)    All other components  within normal limits  GLUCOSE, CAPILLARY - Abnormal; Notable for the following:    Glucose-Capillary 154 (*)    All other components within normal limits  CHLAMYDIA/NGC RT PCR ([facility] ONLY)   ____________________________________________  EKG   ____________________________________________  RADIOLOGY   ____________________________________________   PROCEDURES  Procedure(s) performed:   Procedures  Critical Care performed:   ____________________________________________   INITIAL IMPRESSION / ASSESSMENT AND PLAN / ED COURSE  Pertinent labs & imaging results that were available during my care of the patient were reviewed by me and considered in my medical decision making (see chart for details).  ----------------------------------------- 4:33 PM on 12/30/2016 -----------------------------------------  Patient with evidence of UTI as well as yeast infection. She will be given a diflucan in the ed as well as a prescription for keflex.urine culture previously was pansensitive. We will reculture. Patient without any current abdominal pain at this time. Appears calm and comfortable without any complaints.      ____________________________________________   FINAL CLINICAL IMPRESSION(S) / ED  DIAGNOSES  UTI. Candida vaginalis.    NEW MEDICATIONS STARTED DURING THIS VISIT:  New Prescriptions   No medications on file     Note:  This document was prepared using Dragon voice recognition software and may include unintentional dictation errors.     Orbie Pyo, MD 12/30/16 [place], Randall An, MD 12/30/16 [phone]

## 2016-12-30 NOTE — ED Notes (Signed)
Signature pad in room not working. Hard copy of discharge signature placed in chart.

## 2016-12-30 NOTE — ED Notes (Signed)
Son contacted for pt discharged.

## 2017-01-26 ENCOUNTER — Observation Stay
Admission: EM | Admit: 2017-01-26 | Discharge: 2017-01-28 | Disposition: A | Discharge status: Home / Self Care | Payer: Medicare Other | Attending: Internal Medicine | Admitting: Internal Medicine

## 2017-01-26 ENCOUNTER — Encounter: Payer: Self-pay | Admitting: Emergency Medicine

## 2017-01-26 DIAGNOSIS — W19XXXA Unspecified fall, initial encounter: Secondary | ICD-10-CM | POA: Insufficient documentation

## 2017-01-26 DIAGNOSIS — Z886 Allergy status to analgesic agent status: Secondary | ICD-10-CM | POA: Diagnosis not present

## 2017-01-26 DIAGNOSIS — Z90710 Acquired absence of both cervix and uterus: Secondary | ICD-10-CM | POA: Insufficient documentation

## 2017-01-26 DIAGNOSIS — B961 Klebsiella pneumoniae [K. pneumoniae] as the cause of diseases classified elsewhere: Secondary | ICD-10-CM | POA: Diagnosis not present

## 2017-01-26 DIAGNOSIS — I6381 Other cerebral infarction due to occlusion or stenosis of small artery: Secondary | ICD-10-CM | POA: Diagnosis not present

## 2017-01-26 DIAGNOSIS — Z79899 Other long term (current) drug therapy: Secondary | ICD-10-CM | POA: Diagnosis not present

## 2017-01-26 DIAGNOSIS — I119 Hypertensive heart disease without heart failure: Secondary | ICD-10-CM | POA: Insufficient documentation

## 2017-01-26 DIAGNOSIS — I6523 Occlusion and stenosis of bilateral carotid arteries: Secondary | ICD-10-CM | POA: Diagnosis not present

## 2017-01-26 DIAGNOSIS — F0390 Unspecified dementia without behavioral disturbance: Secondary | ICD-10-CM | POA: Insufficient documentation

## 2017-01-26 DIAGNOSIS — Z8673 Personal history of transient ischemic attack (TIA), and cerebral infarction without residual deficits: Secondary | ICD-10-CM | POA: Insufficient documentation

## 2017-01-26 DIAGNOSIS — E119 Type 2 diabetes mellitus without complications: Secondary | ICD-10-CM | POA: Insufficient documentation

## 2017-01-26 DIAGNOSIS — Z1611 Resistance to penicillins: Secondary | ICD-10-CM | POA: Insufficient documentation

## 2017-01-26 DIAGNOSIS — I4891 Unspecified atrial fibrillation: Secondary | ICD-10-CM | POA: Diagnosis not present

## 2017-01-26 DIAGNOSIS — Y92009 Unspecified place in unspecified non-institutional (private) residence as the place of occurrence of the external cause: Secondary | ICD-10-CM | POA: Diagnosis not present

## 2017-01-26 DIAGNOSIS — Z87891 Personal history of nicotine dependence: Secondary | ICD-10-CM | POA: Insufficient documentation

## 2017-01-26 DIAGNOSIS — R55 Syncope and collapse: Secondary | ICD-10-CM | POA: Diagnosis not present

## 2017-01-26 DIAGNOSIS — Z7982 Long term (current) use of aspirin: Secondary | ICD-10-CM | POA: Insufficient documentation

## 2017-01-26 DIAGNOSIS — M8580 Other specified disorders of bone density and structure, unspecified site: Secondary | ICD-10-CM | POA: Diagnosis not present

## 2017-01-26 DIAGNOSIS — Z794 Long term (current) use of insulin: Secondary | ICD-10-CM | POA: Diagnosis not present

## 2017-01-26 DIAGNOSIS — S299XXA Unspecified injury of thorax, initial encounter: Secondary | ICD-10-CM | POA: Diagnosis not present

## 2017-01-26 DIAGNOSIS — I7389 Other specified peripheral vascular diseases: Secondary | ICD-10-CM | POA: Insufficient documentation

## 2017-01-26 DIAGNOSIS — S79911A Unspecified injury of right hip, initial encounter: Secondary | ICD-10-CM | POA: Diagnosis not present

## 2017-01-26 DIAGNOSIS — Y939 Activity, unspecified: Secondary | ICD-10-CM | POA: Diagnosis not present

## 2017-01-26 DIAGNOSIS — M25551 Pain in right hip: Secondary | ICD-10-CM | POA: Diagnosis not present

## 2017-01-26 DIAGNOSIS — E785 Hyperlipidemia, unspecified: Secondary | ICD-10-CM | POA: Diagnosis not present

## 2017-01-26 DIAGNOSIS — Z8261 Family history of arthritis: Secondary | ICD-10-CM | POA: Diagnosis not present

## 2017-01-26 DIAGNOSIS — M1990 Unspecified osteoarthritis, unspecified site: Secondary | ICD-10-CM | POA: Insufficient documentation

## 2017-01-26 DIAGNOSIS — Y999 Unspecified external cause status: Secondary | ICD-10-CM | POA: Diagnosis not present

## 2017-01-26 DIAGNOSIS — N390 Urinary tract infection, site not specified: Secondary | ICD-10-CM | POA: Insufficient documentation

## 2017-01-26 DIAGNOSIS — E559 Vitamin D deficiency, unspecified: Secondary | ICD-10-CM | POA: Insufficient documentation

## 2017-01-26 DIAGNOSIS — S0990XA Unspecified injury of head, initial encounter: Secondary | ICD-10-CM | POA: Diagnosis not present

## 2017-01-26 NOTE — ED Triage Notes (Signed)
Patient coming in EMS from home for fall on right hip. Patient ambulating at scene and able to lift hips. Patient had urinated on self. Patient states she was walking and just fell on and then when EMS got there she fell again "because all the people were there"

## 2017-01-27 ENCOUNTER — Emergency Department: Payer: Medicare Other

## 2017-01-27 ENCOUNTER — Observation Stay (HOSPITAL_BASED_OUTPATIENT_CLINIC_OR_DEPARTMENT_OTHER)
Admit: 2017-01-27 | Discharge: 2017-01-27 | Disposition: A | Discharge status: Home / Self Care | Payer: Medicare Other | Attending: Internal Medicine | Admitting: Internal Medicine

## 2017-01-27 ENCOUNTER — Observation Stay: Payer: Medicare Other

## 2017-01-27 DIAGNOSIS — E119 Type 2 diabetes mellitus without complications: Secondary | ICD-10-CM | POA: Diagnosis not present

## 2017-01-27 DIAGNOSIS — I6523 Occlusion and stenosis of bilateral carotid arteries: Secondary | ICD-10-CM | POA: Diagnosis not present

## 2017-01-27 DIAGNOSIS — R55 Syncope and collapse: Secondary | ICD-10-CM | POA: Diagnosis not present

## 2017-01-27 DIAGNOSIS — S79911A Unspecified injury of right hip, initial encounter: Secondary | ICD-10-CM | POA: Diagnosis not present

## 2017-01-27 DIAGNOSIS — S299XXA Unspecified injury of thorax, initial encounter: Secondary | ICD-10-CM | POA: Diagnosis not present

## 2017-01-27 DIAGNOSIS — N390 Urinary tract infection, site not specified: Secondary | ICD-10-CM | POA: Diagnosis not present

## 2017-01-27 DIAGNOSIS — S0990XA Unspecified injury of head, initial encounter: Secondary | ICD-10-CM | POA: Diagnosis not present

## 2017-01-27 DIAGNOSIS — M25551 Pain in right hip: Secondary | ICD-10-CM | POA: Diagnosis not present

## 2017-01-27 DIAGNOSIS — E785 Hyperlipidemia, unspecified: Secondary | ICD-10-CM | POA: Diagnosis not present

## 2017-01-27 DIAGNOSIS — I10 Essential (primary) hypertension: Secondary | ICD-10-CM | POA: Diagnosis not present

## 2017-01-27 LAB — URINALYSIS, COMPLETE (UACMP) WITH MICROSCOPIC
Bilirubin Urine: NEGATIVE
Glucose, UA: >=500 mg/dL — AB
KETONES UR: 5 mg/dL — AB
Nitrite: NEGATIVE
PROTEIN: NEGATIVE mg/dL
Specific Gravity, Urine: 1.015 (ref 1.005–1.030)
Squamous Epithelial / LPF: NONE SEEN
pH: 5 (ref 5.0–8.0)

## 2017-01-27 LAB — ECHOCARDIOGRAM COMPLETE
Height: 62 in
Weight: 2635.2 oz

## 2017-01-27 LAB — BASIC METABOLIC PANEL
ANION GAP: 10 (ref 5–15)
BUN: 14 mg/dL (ref 6–20)
CALCIUM: 9.2 mg/dL (ref 8.9–10.3)
CO2: 23 mmol/L (ref 22–32)
Chloride: 103 mmol/L (ref 101–111)
Creatinine, Ser: 0.55 mg/dL (ref 0.44–1.00)
GLUCOSE: 214 mg/dL — AB (ref 65–99)
Potassium: 3.5 mmol/L (ref 3.5–5.1)
SODIUM: 136 mmol/L (ref 135–145)

## 2017-01-27 LAB — GLUCOSE, CAPILLARY
Glucose-Capillary: 217 mg/dL — ABNORMAL HIGH (ref 65–99)
Glucose-Capillary: 217 mg/dL — ABNORMAL HIGH (ref 65–99)
Glucose-Capillary: 268 mg/dL — ABNORMAL HIGH (ref 65–99)
Glucose-Capillary: 287 mg/dL — ABNORMAL HIGH (ref 65–99)
Glucose-Capillary: 157 mg/dL — ABNORMAL HIGH (ref 65–99)

## 2017-01-27 LAB — CBC
HCT: 44.5 % (ref 35.0–47.0)
HEMOGLOBIN: 14.5 g/dL (ref 12.0–16.0)
MCH: 28.3 pg (ref 26.0–34.0)
MCHC: 32.6 g/dL (ref 32.0–36.0)
MCV: 87 fL (ref 80.0–100.0)
Platelets: 213 10*3/uL (ref 150–440)
RBC: 5.12 MIL/uL (ref 3.80–5.20)
RDW: 15 % — ABNORMAL HIGH (ref 11.5–14.5)
WBC: 8.9 10*3/uL (ref 3.6–11.0)

## 2017-01-27 LAB — TROPONIN I: Troponin I: <0.03 ng/mL (ref <0.03)

## 2017-01-27 LAB — TSH: TSH: 1.117 u[IU]/mL (ref 0.350–4.500)

## 2017-01-27 MED ORDER — PNEUMOCOCCAL VAC POLYVALENT 25 MCG/0.5ML IJ INJ: 0.5000 mL | INJECTION | INTRAMUSCULAR | Status: DC

## 2017-01-27 MED ORDER — INSULIN ASPART 100 UNIT/ML ~~LOC~~ SOLN: 0.0000 [IU] | Freq: Every day | SUBCUTANEOUS | Status: DC

## 2017-01-27 MED ORDER — INFLUENZA VAC SPLIT HIGH-DOSE 0.5 ML IM SUSY
0.5000 mL | PREFILLED_SYRINGE | INTRAMUSCULAR | Status: DC
  Filled 2017-01-27: qty 0.5

## 2017-01-27 MED ORDER — ONDANSETRON HCL 4 MG/2ML IJ SOLN
4.0000 mg | Freq: Four times a day (QID) | INTRAMUSCULAR | Status: DC | PRN
Start: 2017-01-27 — End: 2017-01-28

## 2017-01-27 MED ORDER — DOCUSATE SODIUM 100 MG PO CAPS
100.0000 mg | ORAL_CAPSULE | Freq: Two times a day (BID) | ORAL | Status: DC
  Administered 2017-01-27 – 2017-01-28 (×4): 100 mg via ORAL
  Filled 2017-01-27 (×4): qty 1

## 2017-01-27 MED ORDER — INSULIN ASPART 100 UNIT/ML ~~LOC~~ SOLN
0.0000 [IU] | Freq: Three times a day (TID) | SUBCUTANEOUS | Status: DC
  Administered 2017-01-27 (×2): 5 [IU] via SUBCUTANEOUS
  Administered 2017-01-27 – 2017-01-28 (×2): 3 [IU] via SUBCUTANEOUS
  Administered 2017-01-28: 16:00:00 5 [IU] via SUBCUTANEOUS
  Filled 2017-01-27 (×5): qty 1

## 2017-01-27 MED ORDER — ACETAMINOPHEN 650 MG RE SUPP
650.0000 mg | Freq: Four times a day (QID) | RECTAL | Status: DC | PRN
Start: 2017-01-27 — End: 2017-01-28

## 2017-01-27 MED ORDER — OXYCODONE-ACETAMINOPHEN 5-325 MG PO TABS
1.0000 | ORAL_TABLET | Freq: Four times a day (QID) | ORAL | Status: DC | PRN
  Administered 2017-01-28: 1 via ORAL
  Filled 2017-01-27: qty 1

## 2017-01-27 MED ORDER — IOPAMIDOL (ISOVUE-300) INJECTION 61%: 30.0000 mL | Freq: Once | INTRAVENOUS | Status: DC

## 2017-01-27 MED ORDER — ASPIRIN 81 MG PO CHEW
40.5000 mg | CHEWABLE_TABLET | Freq: Every day | ORAL | Status: DC
  Administered 2017-01-27 – 2017-01-28 (×2): 40.5 mg via ORAL
  Filled 2017-01-27 (×2): qty 1

## 2017-01-27 MED ORDER — ENOXAPARIN SODIUM 40 MG/0.4ML ~~LOC~~ SOLN
40.0000 mg | SUBCUTANEOUS | Status: DC
  Administered 2017-01-27 – 2017-01-28 (×2): 40 mg via SUBCUTANEOUS
  Filled 2017-01-27 (×2): qty 0.4

## 2017-01-27 MED ORDER — ONDANSETRON HCL 4 MG PO TABS: 4.0000 mg | ORAL_TABLET | Freq: Four times a day (QID) | ORAL | Status: DC | PRN

## 2017-01-27 MED ORDER — ACETAMINOPHEN 325 MG PO TABS
650.0000 mg | ORAL_TABLET | Freq: Four times a day (QID) | ORAL | Status: DC | PRN
  Administered 2017-01-27 (×2): 650 mg via ORAL
  Filled 2017-01-27 (×2): qty 2

## 2017-01-27 MED ORDER — CEFUROXIME AXETIL 500 MG PO TABS
500.0000 mg | ORAL_TABLET | Freq: Two times a day (BID) | ORAL | Status: DC
  Administered 2017-01-27 – 2017-01-28 (×3): 500 mg via ORAL
  Filled 2017-01-27 (×6): qty 1

## 2017-01-27 NOTE — Care Management Obs Status (Signed)
[employer] NOTIFICATION   Patient Details  Name: Lisa Hahn MRN: [id number] Date of Birth: 1941/02/26   Medicare Observation Status Notification Given:  Yes    Shelbie Ammons, RN 01/27/2017, 10:14 AM

## 2017-01-27 NOTE — ED Notes (Addendum)
Patient unable to give urine sample at this time. Patient has stone smell of urine though. Patient had peed in depends before attempting to get urine.

## 2017-01-27 NOTE — Progress Notes (Signed)
*  PRELIMINARY RESULTS* Echocardiogram 2D Echocardiogram has been performed.  Lisa Hahn 01/27/2017, 9:37 AM

## 2017-01-27 NOTE — Evaluation (Signed)
Physical Therapy Evaluation Patient Details Name: Lisa Hahn MRN: [id number] DOB: [birth year] Today's Date: 01/27/2017   History of Present Illness  76 y/o female with past medical history of hypertension, diabetes and stroke here after a fall at home - does not recall but feels she blacked out.    Clinical Impression  Pt initially with some hesitancy with mobility/ambulation but with gait training/cuing and increased time on feet she actually showed improved cadence and confidence though she did c/o pain in L ankle t/o effort (very little mention of R hip pain, which she had been complaining of and imaging revealed no fracture, etc).  Overall pt is not too far from her baseline, but would benefit from HHPT to work on balance, gait, strength and general functional mobility.2    Follow Up Recommendations Home health PT    Equipment Recommendations  Rolling walker with 5" wheels    Recommendations for Other Services       Precautions / Restrictions Precautions Precautions: Fall Restrictions Weight Bearing Restrictions: No      Mobility  Bed Mobility Overal bed mobility: Independent             General bed mobility comments: Pt able to get up to EOB w/o assist, did need cuing to get upright and squared with bed  Transfers Overall transfer level: Modified independent Equipment used: Rolling walker (2 wheeled)             General transfer comment: Pt is able to rise to standing w/o direct assist but did need cuing for sequencqing and set up  Ambulation/Gait Ambulation/Gait assistance: Modified independent (Device/Increase time) Ambulation Distance (Feet): 200 Feet Assistive device: Rolling walker (2 wheeled)       General Gait Details: Pt with slow, cautious ambulation and regularly c/o stiffness/pain with L ankle.  She showed good effort and with increased ambulation actually had increased cadence/speed/confidence.  Pt with some fatigue with prolonged ambulation  but actually did relatively well.   Stairs            Wheelchair Mobility    Modified Rankin (Stroke Patients Only)       Balance Overall balance assessment: Modified Independent                                           Pertinent Vitals/Pain Pain Assessment:  (unrated L ankle pain)    Home Living Family/patient expects to be discharged to:: Private residence Living Arrangements: Alone Available Help at Discharge: Family (son checkes in QD)   Home Access: Level entry       Home Equipment: Walker - 2 wheels;Walker - standard (Pt reports that her walker is worn out and not safe)      Prior Function Level of Independence: Independent with assistive device(s)         Comments: Pt will go with her son to the store sometimes, does not get out of the house a lot     Hand Dominance        Extremity/Trunk Assessment   Upper Extremity Assessment Upper Extremity Assessment: Generalized weakness    Lower Extremity Assessment Lower Extremity Assessment: Generalized weakness       Communication   Communication: No difficulties  Cognition Arousal/Alertness: Awake/alert Behavior During Therapy: WFL for tasks assessed/performed Overall Cognitive Status: Within Functional Limits for tasks assessed  General Comments      Exercises     Assessment/Plan    PT Assessment Patient needs continued PT services  PT Problem List Decreased strength;Decreased range of motion;Decreased activity tolerance;Decreased balance;Decreased mobility;Decreased coordination;Decreased knowledge of use of DME;Decreased cognition;Decreased safety awareness;Pain       PT Treatment Interventions DME instruction;Gait training;Stair training;Therapeutic activities;Functional mobility training;Therapeutic exercise;Balance training;Neuromuscular re-education;Cognitive remediation;Patient/family education    PT  Goals (Current goals can be found in the Care Plan section)  Acute Rehab PT Goals Patient Stated Goal: go home PT Goal Formulation: With patient Time For Goal Achievement: 02/10/17 Potential to Achieve Goals: Good    Frequency Min 2X/week   Barriers to discharge        Co-evaluation               AM-PAC PT "6 Clicks" Daily Activity  Outcome Measure Difficulty turning over in bed (including adjusting bedclothes, sheets and blankets)?: None Difficulty moving from lying on back to sitting on the side of the bed? : A Little Difficulty sitting down on and standing up from a chair with arms (e.g., wheelchair, bedside commode, etc,.)?: A Little Help needed moving to and from a bed to chair (including a wheelchair)?: None Help needed walking in hospital room?: A Little Help needed climbing 3-5 steps with a railing? : A Little 6 Click Score: 20    End of Session Equipment Utilized During Treatment: Gait belt Activity Tolerance: Patient tolerated treatment well Patient left: with chair alarm set;with call bell/phone within reach Nurse Communication: Mobility status PT Visit Diagnosis: Muscle weakness (generalized) (M62.81);Difficulty in walking, not elsewhere classified (R26.2)    Time: 1607-3710 PT Time Calculation (min) (ACUTE ONLY): 26 min   Charges:   PT Evaluation $PT Eval Low Complexity: 1 Low PT Treatments $Gait Training: 8-22 mins   PT G Codes:   PT G-Codes **NOT FOR INPATIENT CLASS** Functional Assessment Tool Used: AM-PAC 6 Clicks Basic Mobility    Kreg Shropshire, DPT 01/27/2017, 11:33 AM
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
## 2017-01-27 NOTE — Clinical Social Work Note (Signed)
CSW received referral for SNF.  Case discussed with case manager and plan is to discharge home with home health.  CSW to sign off please re-consult if social work needs arise.  Mona Ayars R. Destyn Schuyler, MSW, LCSWA [phone]  

## 2017-01-27 NOTE — H&P (Signed)
Lisa Hahn is an 76 y.o. female.   Chief Complaint: Fall HPI: The patient with past medical history of hypertension, diabetes and stroke presents emergency department after a fall. The patient does not recall tripping. She thinks that she blacked out. She denies chest pain or shortness of breath. She knows she fell twice and landed on her hip. Skeletal survey revealed no fractures. The patient's husband works at night and is unable to watch her. The patient admits to feeling unsafe on her feet. Due to possible syncope and safety emergency department staff called the hospitalist service for admission.  Past Medical History:  Diagnosis Date  . Arthritis   . Diabetes mellitus without complication ([place])   . Hypertension   . Stroke [facility])     Past Surgical History:  Procedure Laterality Date  . ABDOMINAL HYSTERECTOMY    . ABDOMINAL SURGERY     blockage     Family History  Problem Relation Age of Onset  . Arthritis Unknown        Parent   Social History:  reports that she has quit smoking. She has never used smokeless tobacco. She reports that she does not drink alcohol or use drugs.  Allergies:  Allergies  Allergen Reactions  . Aspirin Nausea And Vomiting    Can tolerate baby aspirin  . Motrin [Ibuprofen] Other (See Comments)    Reaction: Burns stomach    Medications Prior to Admission  Medication Sig Dispense Refill  . acetaminophen (TYLENOL) 325 MG tablet Take 2 tablets (650 mg total) by mouth every 6 (six) hours as needed. (Patient taking differently: Take 650 mg by mouth every 6 (six) hours as needed for mild pain. ) 60 tablet 0  . aspirin EC 81 MG tablet Take 40.5 mg by mouth daily.    Marland Kitchen atorvastatin (LIPITOR) 40 MG tablet Take 40 mg by mouth daily.    Marland Kitchen donepezil (ARICEPT) 5 MG tablet Take 5 mg by mouth daily.    Marland Kitchen JANUVIA 100 MG tablet Take 100 mg by mouth daily.  3  . metFORMIN (GLUCOPHAGE) 500 MG tablet Take 500 mg by mouth.    . traMADol (ULTRAM) 50 MG tablet Take 50  mg by mouth every 6 (six) hours as needed.      Results for orders placed or performed during the hospital encounter of 01/26/17 (from the past 48 hour(s))  CBC     Status: Abnormal   Collection Time: 01/27/17 12:16 AM  Result Value Ref Range   WBC 8.9 3.6 - 11.0 K/uL   RBC 5.12 3.80 - 5.20 MIL/uL   Hemoglobin 14.5 12.0 - 16.0 g/dL   HCT 44.5 35.0 - 47.0 %   MCV 87.0 80.0 - 100.0 fL   MCH 28.3 26.0 - 34.0 pg   MCHC 32.6 32.0 - 36.0 g/dL   RDW 15.0 (H) 11.5 - 14.5 %   Platelets 213 150 - 440 K/uL  Urinalysis, Complete w Microscopic     Status: Abnormal   Collection Time: 01/27/17 12:51 AM  Result Value Ref Range   Color, Urine YELLOW (A) YELLOW   APPearance HAZY (A) CLEAR   Specific Gravity, Urine 1.015 1.005 - 1.030   pH 5.0 5.0 - 8.0   Glucose, UA >=500 (A) NEGATIVE mg/dL   Hgb urine dipstick SMALL (A) NEGATIVE   Bilirubin Urine NEGATIVE NEGATIVE   Ketones, ur 5 (A) NEGATIVE mg/dL   Protein, ur NEGATIVE NEGATIVE mg/dL   Nitrite NEGATIVE NEGATIVE   Leukocytes, UA TRACE (A)  NEGATIVE   RBC / HPF 6-30 0 - 5 RBC/hpf   WBC, UA 6-30 0 - 5 WBC/hpf   Bacteria, UA MANY (A) NONE SEEN   Squamous Epithelial / LPF NONE SEEN NONE SEEN   Mucus PRESENT   Basic metabolic panel     Status: Abnormal   Collection Time: 01/27/17 12:51 AM  Result Value Ref Range   Sodium 136 135 - 145 mmol/L   Potassium 3.5 3.5 - 5.1 mmol/L   Chloride 103 101 - 111 mmol/L   CO2 23 22 - 32 mmol/L   Glucose, Bld 214 (H) 65 - 99 mg/dL   BUN 14 6 - 20 mg/dL   Creatinine, Ser 0.55 0.44 - 1.00 mg/dL   Calcium 9.2 8.9 - 10.3 mg/dL   GFR calc non Af Amer >60 >60 mL/min   GFR calc Af Amer >60 >60 mL/min    Comment: (NOTE) The eGFR has been calculated using the CKD EPI equation. This calculation has not been validated in all clinical situations. eGFR's persistently <60 mL/min signify possible Chronic Kidney Disease.    Anion gap 10 5 - 15  Troponin I     Status: None   Collection Time: 01/27/17 12:51 AM   Result Value Ref Range   Troponin I <0.03 <0.03 ng/mL  Glucose, capillary     Status: Abnormal   Collection Time: 01/27/17  2:49 AM  Result Value Ref Range   Glucose-Capillary 217 (H) 65 - 99 mg/dL  TSH     Status: None   Collection Time: 01/27/17  3:51 AM  Result Value Ref Range   TSH 1.117 0.350 - 4.500 uIU/mL    Comment: Performed by a 3rd Generation assay with a functional sensitivity of <=0.01 uIU/mL.  Glucose, capillary     Status: Abnormal   Collection Time: 01/27/17  7:37 AM  Result Value Ref Range   Glucose-Capillary 217 (H) 65 - 99 mg/dL   Ct Head Wo Contrast  Result Date: 01/27/2017 CLINICAL DATA:  Status post fall, with concern for head injury. Initial encounter. EXAM: CT HEAD WITHOUT CONTRAST TECHNIQUE: Contiguous axial images were obtained from the base of the skull through the vertex without intravenous contrast. COMPARISON:  CT of the head performed 11/14/2016 FINDINGS: Brain: No evidence of acute infarction, hemorrhage, hydrocephalus, extra-axial collection or mass lesion/mass effect. Prominence of the ventricles and sulci reflects mild to moderate cortical volume loss. Scattered periventricular and subcortical white matter change likely reflects small vessel ischemic microangiopathy. A chronic lacunar infarct is noted at the right basal ganglia. The brainstem and fourth ventricle are within normal limits. The cerebral hemispheres demonstrate grossly normal gray-white differentiation. No mass effect or midline shift is seen. Vascular: No hyperdense vessel or unexpected calcification. Skull: There is no evidence of fracture; visualized osseous structures are unremarkable in appearance. Sinuses/Orbits: The visualized portions of the orbits are within normal limits. There is opacification of the left mastoid air cells, with fluid tracking about the bones of the middle ear. This is stable from multiple prior studies. The paranasal sinuses and right mastoid air cells are  well-aerated. Other: No significant soft tissue abnormalities are seen. IMPRESSION: 1. No acute intracranial pathology seen on CT. 2. Mild to moderate cortical volume loss and scattered small vessel ischemic microangiopathy. 3. Chronic lacunar infarct at the right basal ganglia. 4. Opacification of the left mastoid air cells, with fluid tracking about the bones of the left middle ear, stable from multiple prior studies. Electronically Signed   By: Jacqulynn Cadet  Chang M.D.   On: 01/27/2017 00:47   Dg Chest Port 1 View  Result Date: 01/27/2017 CLINICAL DATA:  76 year old female with fall and upper back pain. EXAM: PORTABLE CHEST 1 VIEW COMPARISON:  Chest radiograph dated 11/14/2016 FINDINGS: Stable cardiomegaly. No focal consolidation, pleural effusion, or pneumothorax. No acute osseous pathology. IMPRESSION: No acute cardiopulmonary process.  Stable cardiomegaly. Electronically Signed   By: Anner Crete M.D.   On: 01/27/2017 00:38   Dg Hip Unilat W Or Wo Pelvis 2-3 Views Right  Result Date: 01/27/2017 CLINICAL DATA:  76 year old female with fall and right hip pain. EXAM: DG HIP (WITH OR WITHOUT PELVIS) 2-3V RIGHT COMPARISON:  None. FINDINGS: There is no acute fracture or dislocation. The bones are osteopenic. There are degenerative changes of the lower lumbar spine. Vascular calcification noted in the pelvis. The soft tissues are grossly unremarkable. IMPRESSION: No acute fracture or dislocation.  Osteopenia. Electronically Signed   By: Anner Crete M.D.   On: 01/27/2017 01:00    Review of Systems  Constitutional: Negative for chills and fever.  HENT: Negative for sore throat and tinnitus.   Eyes: Negative for blurred vision and redness.  Respiratory: Negative for cough and shortness of breath.   Cardiovascular: Negative for chest pain, palpitations, orthopnea and PND.  Gastrointestinal: Negative for abdominal pain, diarrhea, nausea and vomiting.  Genitourinary: Negative for dysuria, frequency  and urgency.  Musculoskeletal: Positive for falls. Negative for joint pain and myalgias.  Skin: Negative for rash.       No lesions  Neurological: Negative for speech change, focal weakness and weakness.  Endo/Heme/Allergies: Does not bruise/bleed easily.       No temperature intolerance  Psychiatric/Behavioral: Negative for depression and suicidal ideas.    Blood pressure 133/79, pulse 92, temperature 98.2 F (36.8 C), temperature source Oral, resp. rate 20, height '5\' 2"'$  (1.575 m), weight 74.7 kg (164 lb 11.2 oz), SpO2 97 %. Physical Exam  Vitals reviewed. Constitutional: She is oriented to person, place, and time. She appears well-developed and well-nourished. No distress.  HENT:  Head: Normocephalic and atraumatic.  Mouth/Throat: Oropharynx is clear and moist.  Eyes: Pupils are equal, round, and reactive to light. Conjunctivae and EOM are normal. No scleral icterus.  Neck: Normal range of motion. Neck supple. No JVD present. No tracheal deviation present. No thyromegaly present.  Cardiovascular: Normal rate, regular rhythm and normal heart sounds.  Exam reveals no gallop and no friction rub.   No murmur heard. Respiratory: Effort normal and breath sounds normal.  GI: Soft. Bowel sounds are normal. She exhibits no distension. There is no tenderness.  Genitourinary:  Genitourinary Comments: Deferred  Musculoskeletal: Normal range of motion. She exhibits no edema.  Lymphadenopathy:    She has no cervical adenopathy.  Neurological: She is alert and oriented to person, place, and time. No cranial nerve deficit. She exhibits normal muscle tone.  Skin: Skin is warm and dry. No rash noted. No erythema.  Psychiatric: She has a normal mood and affect. Her behavior is normal. Judgment and thought content normal.     Assessment/Plan This is an 23 female admitted for syncope. 1. Syncope: Obtain echocardiogram. Consult cardiology. Blood pressure within normal limits. 2. Hypertension:  Controlled. The patient takes no medications at home. Continue to monitor blood pressure. 3. Diabetes mellitus type 2: Hold oral hypoglycemic agents. Siding scale insulin while hospitalized 4. Fall: PT/OT to eval and treat. Assess for home safety. 5. DVT prophylaxis: Lovenox 6. GI prophylaxis: None The patient is  a full code. Time spent on admission orders and patient care approximately 45 minutes  Harrie Foreman, MD 01/27/2017, 8:26 AM

## 2017-01-27 NOTE — ED Notes (Signed)
Patient on bed pan attempted to pee

## 2017-01-27 NOTE — Care Management Note (Signed)
Case Management Note  Patient Details  Name: Lisa Hahn MRN: [id number] Date of Birth: 10/01/1940  Subjective/Objective:                  Admitted to [facility] under observation with the diagnosis of syncope. Lives alone. Son is Linton Rump [phone]). Last seen Dr, Biagio Quint 2 months ago. Prescriptions are filled at [facility] on [address]. No home Health. No skilled facility. No home oxygen. Old rolling walker in the home. Request new rolling walker. Takes care of all basic activities of daily living herself, doesn't drive. Family helps with errands. [place] prior to this hospitalization. Good appetite.  Family will transport.   Action/Plan: Physical therapy evaluation completed. Recommending therapy in the home. Discussed agencies. [place]. Will update Floydene Flock, [facility] representative about services and rolling walker   Expected Discharge Date:  01/29/17               Expected Discharge Plan:     In-House Referral:     Discharge planning Services     Post Acute Care Choice:   yes Choice offered to:   [name]  DME Arranged:   yes DME Agency:   [facility]  HH Arranged:   yes [place] Agency:   [facility]  Status of Service:     If discussed at [place] of Stay Meetings, dates discussed:    Additional Comments:  Shelbie Ammons, RN MSN CCM Care Management [phone] 01/27/2017, 10:06 AM

## 2017-01-27 NOTE — Progress Notes (Signed)
[place] at [facility]                                                                                                                                                                                  Patient Demographics   Lisa Hahn, is a 76 y.o. female, DOB - [birth year], ION:[id number]  Admit date - 01/26/2017   Admitting Physician Harrie Foreman, MD  Outpatient Primary MD for the patient is Leone Haven, MD   LOS - 0  Subjective: Pt admited after syncope vs fall She dose not recall.  Feeling better    Review of Systems:   CONSTITUTIONAL: No documented fever. No fatigue, + weakness. No weight gain, no weight loss.  EYES: No blurry or double vision.  ENT: No tinnitus. No postnasal drip. No redness of the oropharynx.  RESPIRATORY: No cough, no wheeze, no hemoptysis. No dyspnea.  CARDIOVASCULAR: No chest pain. No orthopnea. No palpitations. No syncope.  GASTROINTESTINAL: No nausea, no vomiting or diarrhea. No abdominal pain. No melena or hematochezia.  GENITOURINARY: No dysuria or hematuria.  ENDOCRINE: No polyuria or nocturia. No heat or cold intolerance.  HEMATOLOGY: No anemia. No bruising. No bleeding.  INTEGUMENTARY: No rashes. No lesions.  MUSCULOSKELETAL: No arthritis. No swelling. No gout.  NEUROLOGIC: No numbness, tingling, or ataxia. No seizure-type activity.  PSYCHIATRIC: No anxiety. No insomnia. No ADD.    Vitals:   Vitals:   01/27/17 0355 01/27/17 0832 01/27/17 0835 01/27/17 1102  BP: 133/79 (!) 149/76 129/69 119/70  Pulse: 92   81  Resp:      Temp: 98.2 F (36.8 C) 97.9 F (36.6 C)    TempSrc: Oral Oral    SpO2: 97% 97%  97%  Weight:      Height:        Wt Readings from Last 3 Encounters:  01/27/17 164 lb 11.2 oz (74.7 kg)  12/30/16 153 lb (69.4 kg)  11/23/16 153 lb (69.4 kg)     Intake/Output Summary (Last 24 hours) at 01/27/17 1330 Last data filed at 01/27/17 0900  Gross per 24 hour  Intake                 0 ml  Output                0 ml  Net                0 ml    Physical Exam:   GENERAL: Pleasant-appearing in no apparent distress.  HEAD, EYES, EARS, NOSE AND THROAT: Atraumatic, normocephalic. Extraocular muscles are intact. Pupils equal and reactive to light. Sclerae anicteric. No conjunctival injection. No oro-pharyngeal  erythema.  NECK: Supple. There is no jugular venous distention. No bruits, no lymphadenopathy, no thyromegaly.  HEART: Regular rate and rhythm,. No murmurs, no rubs, no clicks.  LUNGS: Clear to auscultation bilaterally. No rales or rhonchi. No wheezes.  ABDOMEN: Soft, flat, nontender, nondistended. Has good bowel sounds. No hepatosplenomegaly appreciated.  EXTREMITIES: No evidence of any cyanosis, clubbing, or peripheral edema.  +2 pedal and radial pulses bilaterally.  NEUROLOGIC: The patient is alert, awake, and oriented x3 with no focal motor or sensory deficits appreciated bilaterally.  SKIN: Moist and warm with no rashes appreciated.  Psych: Not anxious, depressed LN: No inguinal LN enlargement    Antibiotics   Anti-infectives    Start     Dose/Rate Route Frequency Ordered Stop   01/27/17 1100  cefUROXime (CEFTIN) tablet 500 mg     500 mg Oral 2 times daily with meals 01/27/17 1021        Medications   Scheduled Meds: . aspirin  40.5 mg Oral Daily  . cefUROXime  500 mg Oral BID WC  . docusate sodium  100 mg Oral BID  . enoxaparin (LOVENOX) injection  40 mg Subcutaneous Q24H  . [START ON 01/28/2017] Influenza vac split quadrivalent PF  0.5 mL Intramuscular Tomorrow-1000  . insulin aspart  0-5 Units Subcutaneous QHS  . insulin aspart  0-9 Units Subcutaneous TID WC  . [START ON 01/28/2017] pneumococcal 23 valent vaccine  0.5 mL Intramuscular Tomorrow-1000   Continuous Infusions: PRN Meds:.acetaminophen **OR** acetaminophen, ondansetron **OR** ondansetron (ZOFRAN) IV, oxyCODONE-acetaminophen   Data Review:   Micro Results No results found for this  or any previous visit (from the past 240 hour(s)).  Radiology Reports Ct Head Wo Contrast  Result Date: 01/27/2017 CLINICAL DATA:  Status post fall, with concern for head injury. Initial encounter. EXAM: CT HEAD WITHOUT CONTRAST TECHNIQUE: Contiguous axial images were obtained from the base of the skull through the vertex without intravenous contrast. COMPARISON:  CT of the head performed 11/14/2016 FINDINGS: Brain: No evidence of acute infarction, hemorrhage, hydrocephalus, extra-axial collection or mass lesion/mass effect. Prominence of the ventricles and sulci reflects mild to moderate cortical volume loss. Scattered periventricular and subcortical white matter change likely reflects small vessel ischemic microangiopathy. A chronic lacunar infarct is noted at the right basal ganglia. The brainstem and fourth ventricle are within normal limits. The cerebral hemispheres demonstrate grossly normal gray-white differentiation. No mass effect or midline shift is seen. Vascular: No hyperdense vessel or unexpected calcification. Skull: There is no evidence of fracture; visualized osseous structures are unremarkable in appearance. Sinuses/Orbits: The visualized portions of the orbits are within normal limits. There is opacification of the left mastoid air cells, with fluid tracking about the bones of the middle ear. This is stable from multiple prior studies. The paranasal sinuses and right mastoid air cells are well-aerated. Other: No significant soft tissue abnormalities are seen. IMPRESSION: 1. No acute intracranial pathology seen on CT. 2. Mild to moderate cortical volume loss and scattered small vessel ischemic microangiopathy. 3. Chronic lacunar infarct at the right basal ganglia. 4. Opacification of the left mastoid air cells, with fluid tracking about the bones of the left middle ear, stable from multiple prior studies. Electronically Signed   By: Garald Balding M.D.   On: 01/27/2017 00:47   Dg Chest Port  1 View  Result Date: 01/27/2017 CLINICAL DATA:  76 year old female with fall and upper back pain. EXAM: PORTABLE CHEST 1 VIEW COMPARISON:  Chest radiograph dated 11/14/2016 FINDINGS: Stable cardiomegaly. No  focal consolidation, pleural effusion, or pneumothorax. No acute osseous pathology. IMPRESSION: No acute cardiopulmonary process.  Stable cardiomegaly. Electronically Signed   By: Anner Crete M.D.   On: 01/27/2017 00:38   Dg Hip Unilat W Or Wo Pelvis 2-3 Views Right  Result Date: 01/27/2017 CLINICAL DATA:  76 year old female with fall and right hip pain. EXAM: DG HIP (WITH OR WITHOUT PELVIS) 2-3V RIGHT COMPARISON:  None. FINDINGS: There is no acute fracture or dislocation. The bones are osteopenic. There are degenerative changes of the lower lumbar spine. Vascular calcification noted in the pelvis. The soft tissues are grossly unremarkable. IMPRESSION: No acute fracture or dislocation.  Osteopenia. Electronically Signed   By: Anner Crete M.D.   On: 01/27/2017 01:00     CBC  Recent Labs Lab 01/27/17 0016  WBC 8.9  HGB 14.5  HCT 44.5  PLT 213  MCV 87.0  MCH 28.3  MCHC 32.6  RDW 15.0*    Chemistries   Recent Labs Lab 01/27/17 0051  NA 136  K 3.5  CL 103  CO2 23  GLUCOSE 214*  BUN 14  CREATININE 0.55  CALCIUM 9.2   ------------------------------------------------------------------------------------------------------------------ estimated creatinine clearance is 56.6 mL/min (by C-G formula based on SCr of 0.55 mg/dL). ------------------------------------------------------------------------------------------------------------------ No results for input(s): HGBA1C in the last 72 hours. ------------------------------------------------------------------------------------------------------------------ No results for input(s): CHOL, HDL, LDLCALC, TRIG, CHOLHDL, LDLDIRECT in the last 72  hours. ------------------------------------------------------------------------------------------------------------------  Recent Labs  01/27/17 0351  TSH 1.117   ------------------------------------------------------------------------------------------------------------------ No results for input(s): VITAMINB12, FOLATE, FERRITIN, TIBC, IRON, RETICCTPCT in the last 72 hours.  Coagulation profile No results for input(s): INR, PROTIME in the last 168 hours.  No results for input(s): DDIMER in the last 72 hours.  Cardiac Enzymes  Recent Labs Lab 01/27/17 0051  TROPONINI <0.03   ------------------------------------------------------------------------------------------------------------------ Invalid input(s): POCBNP    Assessment & Plan   Patient is a 76 year old who presented after a fall or syncope  1.  Possible syncope Echocardiogram of the heart is pending We will check orthostatic blood pressure Continue to monitor on telemetry PT evaluation    2.  Urinary tract infection mild I will start patient on Ceftin Await urine cultures    3. Diabetes type 2 Diabetes type 2  Restart patient's home medications once available  We will restart patient's home medications once available Continue ssi   4. Hyperlipidemia Will resume home medication once dose available  5 miscellaneous Lovenox      Code Status Orders        Start     Ordered   01/27/17 0247  Full code  Continuous     01/27/17 0246    Code Status History    Date Active Date Inactive Code Status Order ID Comments User Context   This patient has a current code status but no historical code status.           Consults none  DVT Prophylaxis  Lovenox    Lab Results  Component Value Date   PLT 213 01/27/2017     Time Spent in minutes   67min  Greater than 50% of time spent in care coordination and counseling patient regarding the condition and plan of care.   Dustin Flock M.D on  01/27/2017 at 1:30 PM  Between 7am to 6pm - Pager - [phone]  After 6pm go to [email or web address] - password [email or web address] [place] [facility]   Office  [phone]

## 2017-01-27 NOTE — Plan of Care (Signed)
Problem: Education: Goal: Knowledge of [facility] Education information/materials will improve Outcome: Progressing Pt likes to be called Lisa Hahn

## 2017-01-27 NOTE — Consult Note (Signed)
[facility] Cardiology  CARDIOLOGY CONSULT NOTE  Patient ID: Lisa Hahn MRN: [id number] DOB/AGE: [birth year] 76 y.o.  Admit date: 01/26/2017 Referring Physician Marcille Blanco Primary Physician Tommi Rumps, MD Primary Cardiologist Paras[place] Reason for Consultation Syncope  HPI: 76 year old female referred for evaluation of syncope.  The patient has a history of dementia, essential hypertension, hyperlipidemia, stroke and type 2 diabetes.  The patient reports that she was walking across the street when she suddenly fell to the ground.  She is unsure if she tripped or if she lost consciousness.  The patient denies any prodromal symptoms, such as lightheadedness, diaphoresis, nausea, vision changes, chest pain, or palpitations.  Apparently the episode was witnessed by bystanders on the street who came to her aid when she fell, and called EMS who brought her to [facility] ER. ECG revealed sinus rhythm at a rate of 94 bpm. Chest x-ray negative for acute cardiopulmonary process. Head CT negative for intracranial pathology.  Hip x-ray negative for acute fracture or dislocation.  Admission labs notable for negative initial troponin, and urinalysis positive for UTI, urine culture pending.  The patient denies current or any recent episodes of chest pain, shortness of breath, or palpitations.  The patient reports that she has chronic left pedal edema, but denies progressive bilateral peripheral edema.  The patient currently states that she feels much better.  Review of systems complete and found to be negative unless listed above     Past Medical History:  Diagnosis Date  . Arthritis   . Diabetes mellitus without complication ([place])   . Hypertension   . Stroke [facility])     Past Surgical History:  Procedure Laterality Date  . ABDOMINAL HYSTERECTOMY    . ABDOMINAL SURGERY     blockage     Prescriptions Prior to Admission  Medication Sig Dispense Refill Last Dose  . acetaminophen (TYLENOL) 325 MG tablet Take 2  tablets (650 mg total) by mouth every 6 (six) hours as needed. (Patient taking differently: Take 650 mg by mouth every 6 (six) hours as needed for mild pain. ) 60 tablet 0 prn at prn  . aspirin EC 81 MG tablet Take 40.5 mg by mouth daily.   01/26/2017 at 0800  . atorvastatin (LIPITOR) 40 MG tablet Take 40 mg by mouth daily.     Marland Kitchen donepezil (ARICEPT) 5 MG tablet Take 5 mg by mouth daily.     Marland Kitchen JANUVIA 100 MG tablet Take 100 mg by mouth daily.  3   . metFORMIN (GLUCOPHAGE) 500 MG tablet Take 500 mg by mouth.     . traMADol (ULTRAM) 50 MG tablet Take 50 mg by mouth every 6 (six) hours as needed.      Social History   Social History  . Marital status: Single    Spouse name: N/A  . Number of children: N/A  . Years of education: N/A   Occupational History  . Not on file.   Social History Main Topics  . Smoking status: Former Research scientist (life sciences)  . Smokeless tobacco: Never Used  . Alcohol use No  . Drug use: No  . Sexual activity: Not on file   Other Topics Concern  . Not on file   Social History Narrative  . No narrative on file    Family History  Problem Relation Age of Onset  . Arthritis Unknown        Parent      Review of systems complete and found to be negative unless listed above  PHYSICAL EXAM  General: Well developed, well nourished, in no acute distress HEENT:  Normocephalic and atramatic Neck:  No JVD.  Lungs: Clear bilaterally to auscultation, normal effort of breathing. No wheezing or crackles. Heart: HRRR . Normal S1 and S2 without gallops or murmurs.  Abdomen: Bowel sounds are positive Msk:  Back normal, sitting upright in chair without difficulty. Normal strength and tone for age. Extremities: No clubbing, cyanosis or edema.   Neuro: Alert and oriented. No focal neurological deficits. Psych:  Good affect, responds appropriately  Labs:   Lab Results  Component Value Date   WBC 8.9 01/27/2017   HGB 14.5 01/27/2017   HCT 44.5 01/27/2017   MCV 87.0  01/27/2017   PLT 213 01/27/2017    Recent Labs Lab 01/27/17 0051  NA 136  K 3.5  CL 103  CO2 23  BUN 14  CREATININE 0.55  CALCIUM 9.2  GLUCOSE 214*   Lab Results  Component Value Date   CKTOTAL 105 08/27/2016   TROPONINI <0.03 01/27/2017   No results found for: CHOL No results found for: HDL No results found for: LDLCALC No results found for: TRIG No results found for: [facility] Lab Results  Component Value Date   LDLDIRECT 62.0 08/22/2016   LDLDIRECT 130.0 04/16/2016   LDLDIRECT 84.0 08/15/2015      Radiology: Ct Head Wo Contrast  Result Date: 01/27/2017 CLINICAL DATA:  Status post fall, with concern for head injury. Initial encounter. EXAM: CT HEAD WITHOUT CONTRAST TECHNIQUE: Contiguous axial images were obtained from the base of the skull through the vertex without intravenous contrast. COMPARISON:  CT of the head performed 11/14/2016 FINDINGS: Brain: No evidence of acute infarction, hemorrhage, hydrocephalus, extra-axial collection or mass lesion/mass effect. Prominence of the ventricles and sulci reflects mild to moderate cortical volume loss. Scattered periventricular and subcortical white matter change likely reflects small vessel ischemic microangiopathy. A chronic lacunar infarct is noted at the right basal ganglia. The brainstem and fourth ventricle are within normal limits. The cerebral hemispheres demonstrate grossly normal gray-white differentiation. No mass effect or midline shift is seen. Vascular: No hyperdense vessel or unexpected calcification. Skull: There is no evidence of fracture; visualized osseous structures are unremarkable in appearance. Sinuses/Orbits: The visualized portions of the orbits are within normal limits. There is opacification of the left mastoid air cells, with fluid tracking about the bones of the middle ear. This is stable from multiple prior studies. The paranasal sinuses and right mastoid air cells are well-aerated. Other: No significant soft  tissue abnormalities are seen. IMPRESSION: 1. No acute intracranial pathology seen on CT. 2. Mild to moderate cortical volume loss and scattered small vessel ischemic microangiopathy. 3. Chronic lacunar infarct at the right basal ganglia. 4. Opacification of the left mastoid air cells, with fluid tracking about the bones of the left middle ear, stable from multiple prior studies. Electronically Signed   By: Garald Balding M.D.   On: 01/27/2017 00:47   Dg Chest Port 1 View  Result Date: 01/27/2017 CLINICAL DATA:  76 year old female with fall and upper back pain. EXAM: PORTABLE CHEST 1 VIEW COMPARISON:  Chest radiograph dated 11/14/2016 FINDINGS: Stable cardiomegaly. No focal consolidation, pleural effusion, or pneumothorax. No acute osseous pathology. IMPRESSION: No acute cardiopulmonary process.  Stable cardiomegaly. Electronically Signed   By: Anner Crete M.D.   On: 01/27/2017 00:38   Dg Hip Unilat W Or Wo Pelvis 2-3 Views Right  Result Date: 01/27/2017 CLINICAL DATA:  76 year old female with fall and right hip  pain. EXAM: DG HIP (WITH OR WITHOUT PELVIS) 2-3V RIGHT COMPARISON:  None. FINDINGS: There is no acute fracture or dislocation. The bones are osteopenic. There are degenerative changes of the lower lumbar spine. Vascular calcification noted in the pelvis. The soft tissues are grossly unremarkable. IMPRESSION: No acute fracture or dislocation.  Osteopenia. Electronically Signed   By: Anner Crete M.D.   On: 01/27/2017 01:00    EKG: Sinus rhythm, 81 bpm  ASSESSMENT AND PLAN:  1.  Syncope and collapse, patient unsure if she just tripped or if she lost consciousness. 2.  Essential hypertension, well controlled, no home HTN medications. 3.  Type 2 diabetes 4.  Hyperlipidemia 5.  Dementia  Recommendations:  1.  Agree with overall therapy 2.  Review 2D echocardiogram 3.  Continue to monitor on telemetry 4.  Carotid ultrasound 5.  Further recommendations pending patient's initial  course  Signed: Clabe Seal PA-C 01/27/2017, 10:07 AM

## 2017-01-28 ENCOUNTER — Telehealth: Payer: Self-pay | Admitting: Family Medicine

## 2017-01-28 ENCOUNTER — Telehealth: Payer: Self-pay

## 2017-01-28 DIAGNOSIS — R55 Syncope and collapse: Secondary | ICD-10-CM | POA: Diagnosis not present

## 2017-01-28 DIAGNOSIS — I10 Essential (primary) hypertension: Secondary | ICD-10-CM | POA: Diagnosis not present

## 2017-01-28 DIAGNOSIS — E785 Hyperlipidemia, unspecified: Secondary | ICD-10-CM | POA: Diagnosis not present

## 2017-01-28 DIAGNOSIS — E119 Type 2 diabetes mellitus without complications: Secondary | ICD-10-CM | POA: Diagnosis not present

## 2017-01-28 DIAGNOSIS — N390 Urinary tract infection, site not specified: Secondary | ICD-10-CM | POA: Diagnosis not present

## 2017-01-28 LAB — HEMOGLOBIN A1C
Hgb A1c MFr Bld: 9.7 % — ABNORMAL HIGH (ref 4.8–5.6)
Mean Plasma Glucose: 232 mg/dL

## 2017-01-28 LAB — GLUCOSE, CAPILLARY
Glucose-Capillary: 205 mg/dL — ABNORMAL HIGH (ref 65–99)
Glucose-Capillary: 205 mg/dL — ABNORMAL HIGH (ref 65–99)
Glucose-Capillary: 251 mg/dL — ABNORMAL HIGH (ref 65–99)

## 2017-01-28 LAB — VITAMIN D 25 HYDROXY (VIT D DEFICIENCY, FRACTURES): Vit D, 25-Hydroxy: 14 ng/mL — ABNORMAL LOW (ref 30.0–100.0)

## 2017-01-28 MED ORDER — ASPIRIN EC 81 MG PO TBEC: 81.0000 mg | DELAYED_RELEASE_TABLET | Freq: Every day | ORAL | Status: DC

## 2017-01-28 MED ORDER — VITAMIN D 1000 UNITS PO TABS: 1000.0000 [IU] | ORAL_TABLET | Freq: Every day | ORAL | 0 refills | Status: DC

## 2017-01-28 MED ORDER — METFORMIN HCL 1000 MG PO TABS: 1000.0000 mg | ORAL_TABLET | Freq: Two times a day (BID) | ORAL | 0 refills | Status: DC

## 2017-01-28 MED ORDER — CEFUROXIME AXETIL 500 MG PO TABS: 500.0000 mg | ORAL_TABLET | Freq: Two times a day (BID) | ORAL | 0 refills | Status: AC

## 2017-01-28 NOTE — Telephone Encounter (Signed)
[facility] called and states patient was discharged with glucometer that is not covered, they need a part B compliant prescription glucometer, test strips and lancets Check 3 times daily, quanity 100 dx E11.9 NPI

## 2017-01-28 NOTE — Care Management Note (Signed)
Case Management Note  Patient Details  Name: Lisa Hahn MRN: [id number] Date of Birth: 1941/02/28  Subjective/Objective:  Discharging today                  Action/Plan: Advanced notified of discharge.   Expected Discharge Date:  01/28/17               Expected Discharge Plan:  [name]  In-House Referral:     Discharge planning Services  CM Consult  Post Acute Care Choice:  Durable Medical Equipment, Home Health Choice offered to:     DME Arranged:  [facility] Agency:  [place] Arranged:  PT [facility] Agency:  [place]  Status of Service:  Completed, signed off  If discussed at [place] of Stay Meetings, dates discussed:    Additional Comments:  [name], RN 01/28/2017, 11:03 AM

## 2017-01-28 NOTE — Telephone Encounter (Signed)
Ginger from [facility] called and needed to schedule pt for a 1 week to 10 day HFU for pt. Pt was in for syncope. Pt is scheduled for 10/30 @ 11:15

## 2017-01-28 NOTE — Telephone Encounter (Signed)
This should have went to you

## 2017-01-28 NOTE — Progress Notes (Signed)
[facility] Cardiology  SUBJECTIVE: Patient reports feeling well this morning. She denies recurrent presyncope or syncope. She denies chest pain, palpitations, or shortness of breath.    Vitals:   01/27/17 1437 01/27/17 2015 01/28/17 0443 01/28/17 0445  BP: 123/74 (!) 141/78  131/72  Pulse: 89 82  74  Resp:  18  17  Temp: 98.3 F (36.8 C) 98.6 F (37 C)  98.1 F (36.7 C)  TempSrc: Oral Oral  Oral  SpO2: 98% 98%  98%  Weight:   73.6 kg (162 lb 3.2 oz)   Height:         Intake/Output Summary (Last 24 hours) at 01/28/17 1242 Last data filed at 01/28/17 1015  Gross per 24 hour  Intake              120 ml  Output                0 ml  Net              120 ml      PHYSICAL EXAM  General: Well developed, well nourished, in no acute distress HEENT:  Normocephalic and atramatic Neck:  No JVD.  Lungs: Normal effort of breathing on room air. No wheezing Heart: HRRR . Normal S1 and S2 without gallops or murmurs.  Abdomen: Bowel sounds are positive Msk:  Back normal, sitting upright in chair. Normal strength and tone for age. Extremities: No clubbing, cyanosis or edema.   Neuro: Alert and oriented  Psych:  Good affect, responds appropriately   LABS: Basic Metabolic Panel:  Recent Labs  01/27/17 0051  NA 136  K 3.5  CL 103  CO2 23  GLUCOSE 214*  BUN 14  CREATININE 0.55  CALCIUM 9.2   Liver Function Tests: No results for input(s): AST, ALT, ALKPHOS, BILITOT, PROT, ALBUMIN in the last 72 hours. No results for input(s): LIPASE, AMYLASE in the last 72 hours. CBC:  Recent Labs  01/27/17 0016  WBC 8.9  HGB 14.5  HCT 44.5  MCV 87.0  PLT 213   Cardiac Enzymes:  Recent Labs  01/27/17 0051  TROPONINI <0.03   BNP: Invalid input(s): POCBNP D-Dimer: No results for input(s): DDIMER in the last 72 hours. Hemoglobin A1C:  Recent Labs  01/27/17 0351  HGBA1C 9.7*   Fasting Lipid Panel: No results for input(s): CHOL, HDL, LDLCALC, TRIG, CHOLHDL, LDLDIRECT in the last  72 hours. Thyroid Function Tests:  Recent Labs  01/27/17 0351  TSH 1.117   Anemia Panel: No results for input(s): VITAMINB12, FOLATE, FERRITIN, TIBC, IRON, RETICCTPCT in the last 72 hours.  Ct Head Wo Contrast  Result Date: 01/27/2017 CLINICAL DATA:  Status post fall, with concern for head injury. Initial encounter. EXAM: CT HEAD WITHOUT CONTRAST TECHNIQUE: Contiguous axial images were obtained from the base of the skull through the vertex without intravenous contrast. COMPARISON:  CT of the head performed 11/14/2016 FINDINGS: Brain: No evidence of acute infarction, hemorrhage, hydrocephalus, extra-axial collection or mass lesion/mass effect. Prominence of the ventricles and sulci reflects mild to moderate cortical volume loss. Scattered periventricular and subcortical white matter change likely reflects small vessel ischemic microangiopathy. A chronic lacunar infarct is noted at the right basal ganglia. The brainstem and fourth ventricle are within normal limits. The cerebral hemispheres demonstrate grossly normal gray-white differentiation. No mass effect or midline shift is seen. Vascular: No hyperdense vessel or unexpected calcification. Skull: There is no evidence of fracture; visualized osseous structures are unremarkable in appearance. Sinuses/Orbits: The  visualized portions of the orbits are within normal limits. There is opacification of the left mastoid air cells, with fluid tracking about the bones of the middle ear. This is stable from multiple prior studies. The paranasal sinuses and right mastoid air cells are well-aerated. Other: No significant soft tissue abnormalities are seen. IMPRESSION: 1. No acute intracranial pathology seen on CT. 2. Mild to moderate cortical volume loss and scattered small vessel ischemic microangiopathy. 3. Chronic lacunar infarct at the right basal ganglia. 4. Opacification of the left mastoid air cells, with fluid tracking about the bones of the left middle  ear, stable from multiple prior studies. Electronically Signed   By: Garald Balding M.D.   On: 01/27/2017 00:47   US Carotid Bilateral  Result Date: 01/27/2017 CLINICAL DATA:  Syncope, hypertension, hyperlipidemia and diabetes. EXAM: BILATERAL CAROTID DUPLEX ULTRASOUND TECHNIQUE: Pearline Cables scale imaging, color Doppler and duplex ultrasound were performed of bilateral carotid and vertebral arteries in the neck. COMPARISON:  None. FINDINGS: Criteria: Quantification of carotid stenosis is based on velocity parameters that correlate the residual internal carotid diameter with NASCET-based stenosis levels, using the diameter of the distal internal carotid lumen as the denominator for stenosis measurement. The following velocity measurements were obtained: RIGHT ICA:  49/8 cm/sec CCA:  07/12 cm/sec SYSTOLIC ICA/CCA RATIO:  0.8 DIASTOLIC ICA/CCA RATIO:  2.0 ECA:  139 cm/sec LEFT ICA:  88/22 cm/sec CCA:  19/75 cm/sec SYSTOLIC ICA/CCA RATIO:  1.0 DIASTOLIC ICA/CCA RATIO:  2.2 ECA:  38 cm/sec RIGHT CAROTID ARTERY: Right common and internal carotid arteries are very tortuous. There is a mild amount of calcified plaque at the level of the carotid bulb and proximal ICA. Estimated right ICA stenosis is less than 50%. RIGHT VERTEBRAL ARTERY: Antegrade flow with normal waveform and velocity. LEFT CAROTID ARTERY: The left ICA is tortuous. A mild amount of calcified plaque is present at the level of the carotid bulb and proximal ICA. Estimated left ICA stenosis is less than 50%. LEFT VERTEBRAL ARTERY: Antegrade flow with normal waveform and velocity. IMPRESSION: Mild amount of calcified plaque at the level of bilateral carotid bulbs and internal carotid arteries. Estimated bilateral ICA stenoses are less than 50%. Electronically Signed   By: Aletta Edouard M.D.   On: 01/27/2017 15:02   Dg Chest Port 1 View  Result Date: 01/27/2017 CLINICAL DATA:  76 year old female with fall and upper back pain. EXAM: PORTABLE CHEST 1 VIEW  COMPARISON:  Chest radiograph dated 11/14/2016 FINDINGS: Stable cardiomegaly. No focal consolidation, pleural effusion, or pneumothorax. No acute osseous pathology. IMPRESSION: No acute cardiopulmonary process.  Stable cardiomegaly. Electronically Signed   By: Anner Crete M.D.   On: 01/27/2017 00:38   Dg Hip Unilat W Or Wo Pelvis 2-3 Views Right  Result Date: 01/27/2017 CLINICAL DATA:  76 year old female with fall and right hip pain. EXAM: DG HIP (WITH OR WITHOUT PELVIS) 2-3V RIGHT COMPARISON:  None. FINDINGS: There is no acute fracture or dislocation. The bones are osteopenic. There are degenerative changes of the lower lumbar spine. Vascular calcification noted in the pelvis. The soft tissues are grossly unremarkable. IMPRESSION: No acute fracture or dislocation.  Osteopenia. Electronically Signed   By: Anner Crete M.D.   On: 01/27/2017 01:00     Echo EF 60-65%  TELEMETRY: Sinus rhythm  ASSESSMENT AND PLAN:  Active Problems:   Syncope    1. Syncope and collapse, unwitnessed, patient cannot recall events surrounding episode. She is unsure if she lost consciousness or if she simply fell. No  significant findings on telemetry. 2D echocardiogram reveals normal left ventricular function. Carotid ultrasound without hemodynamically significant stenosis bilaterally.  2. Essential hypertension, adequately controlled 3. Hyperlipidemia, continue atorvastatin as outpatient  Recommendations: 1. Agree with overall therapy. 2. Recommend patient follow-up as outpatient in 1 week with Dr. Saralyn Pilar for further cardiac monitoring, such as 30-Loop monitor. 3. No further cardiac diagnostics recommended at this time.   Sign off for now; call with any questions.  Clabe Seal, PA-C 01/28/2017 12:42 PM

## 2017-01-28 NOTE — Progress Notes (Signed)
Made Lisa Hahn, case management aware that Dr. Posey Pronto ordered glucometer for patient.  Per Lisa Hahn, they do not provide those for patients with insurance.  Per Lisa Hahn patient needs to go to [place] and buy her own.  Made Dr. Posey Pronto and patient aware as well as patient's son.  Lisa Cruz, RN

## 2017-01-28 NOTE — Telephone Encounter (Signed)
Patient currently still showing admitted.

## 2017-01-28 NOTE — Discharge Instructions (Addendum)
[facility] [facility] ° °DIET:  °Cardiac diet, diabetic diet ° °DISCHARGE CONDITION:  °Stable ° °ACTIVITY:  °Activity as tolerated ° °OXYGEN:  °Home Oxygen: No. °  °Oxygen Delivery: room air ° °DISCHARGE LOCATION:  °home  ° ° °ADDITIONAL DISCHARGE INSTRUCTION: ° ° °If you experience worsening of your admission symptoms, develop shortness of breath, life threatening emergency, suicidal or homicidal thoughts you must seek medical attention immediately by calling 911 or calling your MD immediately  if symptoms less severe. ° °You Must read complete instructions/literature along with all the possible adverse reactions/side effects for all the Medicines you take and that have been prescribed to you. Take any new Medicines after you have completely understood and accpet all the possible adverse reactions/side effects.  ° °Please note ° °You were cared for by a hospitalist during your hospital stay. If you have any questions about your discharge medications or the care you received while you were in the hospital after you are discharged, you can call the unit and asked to speak with the hospitalist on call if the hospitalist that took care of you is not available. Once you are discharged, your primary care physician will handle any further medical issues. Please note that NO REFILLS for any discharge medications will be authorized once you are discharged, as it is imperative that you return to your primary care physician (or establish a relationship with a primary care physician if you do not have one) for your aftercare needs so that they can reassess your need for medications and monitor your lab values. ° ° °
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
## 2017-01-29 LAB — URINE CULTURE

## 2017-01-29 NOTE — Telephone Encounter (Signed)
If PCP is ok with the idea I could refer to [facility] program?

## 2017-01-29 NOTE — Telephone Encounter (Signed)
Transition Care Management Follow-up Telephone Call  How have you been since you were released from the hospital? Patient son stated she has been doing well. No, falls, or syncope episodes.   Do you understand why you were in the hospital? Yes  Do you understand the discharge instrcutions? Yes  Items Reviewed:  Medications reviewed: Yes  Allergies reviewed: Yes  Dietary changes reviewed: Yes  Referrals reviewed: Yes   Functional Questionnaire:   Activities of Daily Living (ADLs):   She states they are independent in the following: Requires assist with taking medications patient son states patient forget s to take medications. States they require assistance with the following: PT is evaluating patient Friday for physical therapy since fall.   Any transportation issues/concerns?: No   Any patient concerns? Patient son Eveleigh Crumpler) ask if there is an adult daycare, or a program available to help with patient while he is at work.   Confirmed importance and date/time of follow-up visits scheduled: Yes   Confirmed with patient if condition begins to worsen call PCP or go to the ER.  Patient was given the Call-a-Nurse line [phone]

## 2017-01-29 NOTE — Telephone Encounter (Signed)
First attempt made for  TCM . Left message for patient to return call to office.

## 2017-01-30 ENCOUNTER — Telehealth: Payer: Self-pay | Admitting: Family Medicine

## 2017-01-30 DIAGNOSIS — I10 Essential (primary) hypertension: Secondary | ICD-10-CM | POA: Diagnosis not present

## 2017-01-30 DIAGNOSIS — N390 Urinary tract infection, site not specified: Secondary | ICD-10-CM | POA: Diagnosis not present

## 2017-01-30 DIAGNOSIS — M1990 Unspecified osteoarthritis, unspecified site: Secondary | ICD-10-CM | POA: Diagnosis not present

## 2017-01-30 DIAGNOSIS — Z7984 Long term (current) use of oral hypoglycemic drugs: Secondary | ICD-10-CM | POA: Diagnosis not present

## 2017-01-30 DIAGNOSIS — R55 Syncope and collapse: Secondary | ICD-10-CM | POA: Diagnosis not present

## 2017-01-30 DIAGNOSIS — Z8673 Personal history of transient ischemic attack (TIA), and cerebral infarction without residual deficits: Secondary | ICD-10-CM | POA: Diagnosis not present

## 2017-01-30 DIAGNOSIS — Z9181 History of falling: Secondary | ICD-10-CM | POA: Diagnosis not present

## 2017-01-30 DIAGNOSIS — E785 Hyperlipidemia, unspecified: Secondary | ICD-10-CM | POA: Diagnosis not present

## 2017-01-30 DIAGNOSIS — E119 Type 2 diabetes mellitus without complications: Secondary | ICD-10-CM | POA: Diagnosis not present

## 2017-01-30 NOTE — Telephone Encounter (Signed)
Please advise 

## 2017-01-30 NOTE — Telephone Encounter (Signed)
Verbal orders can be given. 

## 2017-01-30 NOTE — Telephone Encounter (Signed)
Verbal order given  

## 2017-01-30 NOTE — Telephone Encounter (Signed)
Lisa Hahn from [facility] called looking for verbal orders for 1x1 2x3 1x1 and also a social work consult to possibly setup adult daycare. Please advise, thank you!  Call [facility] @ [phone]

## 2017-01-31 MED ORDER — BLOOD GLUCOSE MONITOR KIT: PACK | 0 refills | Status: DC

## 2017-01-31 NOTE — Telephone Encounter (Signed)
Please refer her. Thanks. Lisa Hahn.

## 2017-01-31 NOTE — Addendum Note (Signed)
Addended by: Caryl Bis Jezreel Justiniano G on: 01/31/2017 04:06 PM   Modules accepted: Orders

## 2017-01-31 NOTE — Telephone Encounter (Signed)
Please fax

## 2017-02-02 NOTE — Telephone Encounter (Signed)
faxed

## 2017-02-03 ENCOUNTER — Ambulatory Visit (INDEPENDENT_AMBULATORY_CARE_PROVIDER_SITE_OTHER): Payer: Medicare Other | Admitting: Family Medicine

## 2017-02-03 ENCOUNTER — Encounter: Payer: Self-pay | Admitting: Family Medicine

## 2017-02-03 VITALS — BP 140/80 | HR 68 | Temp 97.4°F | Wt 167.8 lb

## 2017-02-03 DIAGNOSIS — R55 Syncope and collapse: Secondary | ICD-10-CM

## 2017-02-03 DIAGNOSIS — N390 Urinary tract infection, site not specified: Secondary | ICD-10-CM | POA: Insufficient documentation

## 2017-02-03 DIAGNOSIS — E119 Type 2 diabetes mellitus without complications: Secondary | ICD-10-CM | POA: Diagnosis not present

## 2017-02-03 DIAGNOSIS — F0390 Unspecified dementia without behavioral disturbance: Secondary | ICD-10-CM

## 2017-02-03 NOTE — Assessment & Plan Note (Signed)
Patient has a tendency to wander. Sounds as though this is to go accomplish tasks that she previously did. She will be referred to neurology. Discussed measures her son to do at home. They are meeting with the social worker to consider the pace program which I think would be a fantastic idea for the patient. Discussed with the patient trying to help her son out and not wander.

## 2017-02-03 NOTE — Assessment & Plan Note (Signed)
Questionable whether she actually had a syncopal episode. She does state she remembers falling at this point. I believe she likely just tripped. Workup unremarkable. Refer to neurology for her dementia.

## 2017-02-03 NOTE — Patient Instructions (Signed)
Nice to see you. We'll need to see a neurologist.  Please meet with the social worker regarding options for Mrs. Farina. If she has recurrent symptoms of UTI please let us know. Please check blood sugars fasting daily. Several days a week he can also check it another time during the day. Please pickup the glucose monitor from the pharmacy. Ideal blood sugars are between 80 and 140. If they are consistently greater than 300 let us know right away. Please contact us in 2 weeks with blood sugar numbers. Please consistently take your diabetes medication.

## 2017-02-03 NOTE — Assessment & Plan Note (Addendum)
Poorly controlled. She was not taking medications consistently previously.  she has been consistently taking them recently with her son monitoring her. They'll check her sugars daily for the next 2 weeks and let us know what they've been running prior to [place] initiating another diabetes medication.

## 2017-02-03 NOTE — Assessment & Plan Note (Signed)
Symptoms resolved. Monitor for recurrence.

## 2017-02-03 NOTE — Progress Notes (Signed)
Tommi Rumps, MD Phone: [phone]  Lisa Hahn is a 76 y.o. female who presents today for hospital follow-up.  Patient hospitalized from 01/26/17-01/28/17 for possible syncope. Patient now reports she did not pass out. She was out wandering and started moving quickly and fell forward. She remembers falling into the grass. She notes no head injury. Imaging was negative for acute changes. Echo with diastolic dysfunction. Carotid Doppler with plaque and less than 50% stenosis. Seen by physical therapy and doing much better. The biggest issue is that she has continued to intermittently wander. She did not tolerate the Aricept and her son reports this made her mean. They're not willing to start on any antidepressants. She starts to monitor when she feels like she needs to go visit her mother who lives in [place] and when she feels like she needs to pay a bill. She was found to have a UTI in the hospital and was treated with Ceftin. Notes that significantly helped her confusion. She notes no urinary symptoms now. For quite some time she has not been consistently taking her diabetic medications and her son was finding them stuffed in the couch. Since just prior to her hospitalization he has been monitoring her taking her medications. They are not checking sugars. We did just send in a monitor for her to check with. Discharge summary reviewed. Medications reviewed.  PMH: Former smoker   ROS see history of present illness  Objective  Physical Exam Vitals:   02/03/17 1104  BP: 140/80  Pulse: 68  Temp: (!) 97.4 F (36.3 C)  SpO2: 98%    BP Readings from Last 3 Encounters:  02/03/17 140/80  01/28/17 131/72  12/30/16 (!) 184/96   Wt Readings from Last 3 Encounters:  02/03/17 167 lb 12.8 oz (76.1 kg)  01/28/17 162 lb 3.2 oz (73.6 kg)  12/30/16 153 lb (69.4 kg)    Physical Exam  Constitutional: No distress.  Cardiovascular: Normal rate, regular rhythm and normal heart sounds.     Pulmonary/Chest: Effort normal and breath sounds normal.  Abdominal: Soft. Bowel sounds are normal. She exhibits no distension. There is no tenderness.  Musculoskeletal: She exhibits no edema.  Neurological: She is alert. Gait normal.  Skin: Skin is warm and dry. She is not diaphoretic.     Assessment/Plan: Please see individual problem list.  Syncope Questionable whether she actually had a syncopal episode. She does state she remembers falling at this point. I believe she likely just tripped. Workup unremarkable. Refer to neurology for her dementia.  Type 2 diabetes mellitus (HCC) Poorly controlled. She was not taking medications consistently previously.  she has been consistently taking them recently with her son monitoring her. They'll check her sugars daily for the next 2 weeks and let us know what they've been running prior to [place] initiating another diabetes medication.  Dementia Patient has a tendency to wander. Sounds as though this is to go accomplish tasks that she previously did. She will be referred to neurology. Discussed measures her son to do at home. They are meeting with the social worker to consider the pace program which I think would be a fantastic idea for the patient. Discussed with the patient trying to help her son out and not wander.  UTI (urinary tract infection) Symptoms resolved. Monitor for recurrence.   Orders Placed This Encounter  Procedures  . Ambulatory referral to Neurology    Referral Priority:   Routine    Referral Type:   Consultation  Referral Reason:   Specialty Services Required    Requested Specialty:   Neurology    Number of Visits Requested:   [place], MD [place]

## 2017-02-04 DIAGNOSIS — R55 Syncope and collapse: Secondary | ICD-10-CM | POA: Diagnosis not present

## 2017-02-04 DIAGNOSIS — M1990 Unspecified osteoarthritis, unspecified site: Secondary | ICD-10-CM | POA: Diagnosis not present

## 2017-02-04 DIAGNOSIS — N390 Urinary tract infection, site not specified: Secondary | ICD-10-CM | POA: Diagnosis not present

## 2017-02-04 DIAGNOSIS — E119 Type 2 diabetes mellitus without complications: Secondary | ICD-10-CM | POA: Diagnosis not present

## 2017-02-04 DIAGNOSIS — E785 Hyperlipidemia, unspecified: Secondary | ICD-10-CM | POA: Diagnosis not present

## 2017-02-04 DIAGNOSIS — I10 Essential (primary) hypertension: Secondary | ICD-10-CM | POA: Diagnosis not present

## 2017-02-04 NOTE — Telephone Encounter (Signed)
Referral faxed to [facility], tried to call son and notify him referral faxed not able to reach him left message to call office.

## 2017-02-11 DIAGNOSIS — R55 Syncope and collapse: Secondary | ICD-10-CM | POA: Diagnosis not present

## 2017-02-11 DIAGNOSIS — I10 Essential (primary) hypertension: Secondary | ICD-10-CM | POA: Diagnosis not present

## 2017-02-11 DIAGNOSIS — N390 Urinary tract infection, site not specified: Secondary | ICD-10-CM | POA: Diagnosis not present

## 2017-02-11 DIAGNOSIS — E119 Type 2 diabetes mellitus without complications: Secondary | ICD-10-CM | POA: Diagnosis not present

## 2017-02-11 DIAGNOSIS — M1990 Unspecified osteoarthritis, unspecified site: Secondary | ICD-10-CM | POA: Diagnosis not present

## 2017-02-11 DIAGNOSIS — E785 Hyperlipidemia, unspecified: Secondary | ICD-10-CM | POA: Diagnosis not present

## 2017-02-12 NOTE — ED Provider Notes (Signed)
[facility] Emergency Department Provider Note   First MD Initiated Contact with Patient 01/26/17 2340     (approximate)  I have reviewed the triage vital signs and the nursing notes.   HISTORY  Chief Complaint Fall and Hip Pain   HPI Lisa Hahn is a 76 y.o. female with below list of chronic medical conditions including hypertension and diabetes and CVA presents to the emergency department status post fall. Patient states that she believes that she "blacked out"resulting in her fall. Patient does not recall slipping tripping or any accidental reason for her fall. Patient denies any heart palpitations, no chest pain no shortness of breath no dizziness. Patient does however admit to headache discomfort status post fall. Patient's pain score 5 out of 10   Past Medical History:  Diagnosis Date  . Arthritis   . Diabetes mellitus without complication ([place])   . Hypertension   . Stroke [facility])     Patient Active Problem List   Diagnosis Date Noted  . UTI (urinary tract infection) 02/03/2017  . Syncope 01/27/2017  . Decreased pedal pulses 08/22/2016  . Hyperlipidemia 08/22/2016  . Poor dentition 04/16/2016  . Abdominal pain 04/16/2016  . Onychomycosis 04/16/2016  . Lightheadedness 08/15/2015  . Abdominal pain, chronic, epigastric 08/15/2015  . Dementia 07/25/2015  . Chest pain 06/28/2015  . Strain of left quadriceps tendon 06/28/2015  . Type 2 diabetes mellitus ([place]) 06/28/2015  . Vision changes 06/28/2015    Past Surgical History:  Procedure Laterality Date  . ABDOMINAL HYSTERECTOMY    . ABDOMINAL SURGERY     blockage     Prior to Admission medications   Medication Sig Start Date End Date Taking? Authorizing Provider  acetaminophen (TYLENOL) 325 MG tablet Take 2 tablets (650 mg total) by mouth every 6 (six) hours as needed. Patient taking differently: Take 650 mg by mouth every 6 (six) hours as needed for mild pain.  11/23/16  Yes Carrie Mew, MD  JANUVIA 100 MG tablet Take 100 mg by mouth daily. 12/04/16  Yes [provider]  aspirin EC 81 MG tablet Take 1 tablet (81 mg total) by mouth daily. 01/28/17   Dustin Flock, MD  atorvastatin (LIPITOR) 40 MG tablet Take 40 mg by mouth daily.    [provider]  blood glucose meter kit and supplies KIT Dispense based on patient and insurance preference. Test 3 times daily. ICD 10 code E11.9. 01/31/17   Leone Haven, MD  cholecalciferol (VITAMIN D) 1000 units tablet Take 1 tablet (1,000 Units total) by mouth daily. 01/28/17   Dustin Flock, MD  metFORMIN (GLUCOPHAGE) 1000 MG tablet Take 1 tablet (1,000 mg total) by mouth 2 (two) times daily with a meal. 01/28/17   Dustin Flock, MD  traMADol (ULTRAM) 50 MG tablet Take 50 mg by mouth every 6 (six) hours as needed.    [provider]    Allergies Aspirin and Motrin [ibuprofen]  Family History  Problem Relation Age of Onset  . Arthritis Unknown        Parent    Social History Social History   Tobacco Use  . Smoking status: Former Research scientist (life sciences)  . Smokeless tobacco: Never Used  Substance Use Topics  . Alcohol use: No  . Drug use: No    Review of Systems Constitutional: No fever/chills Eyes: No visual changes. ENT: No sore throat. Cardiovascular: Denies chest pain. Respiratory: Denies shortness of breath. Gastrointestinal: No abdominal pain.  No nausea, no vomiting.  No  diarrhea.  No constipation. Genitourinary: Negative for dysuria. Musculoskeletal: Negative for neck pain.  Negative for back pain. Integumentary: Negative for rash. Neurological: Negative for headaches, focal weakness or numbness. Positive for syncope  ____________________________________________   PHYSICAL EXAM:  VITAL SIGNS: ED Triage Vitals [01/26/17 2343]  Enc Vitals Group     BP (!) 173/101     Pulse Rate (!) 108     Resp 18     Temp 98.6 F (37 C)     Temp Source Oral     SpO2 98 %     Weight 69.4 kg  (153 lb)     Height 1.575 m (_0 )     Head Circumference      Peak Flow      Pain Score 10     Pain Loc      Pain Edu?      Excl. in [place]?     Constitutional: Alert and oriented. Well appearing and in no acute distress. Eyes: Conjunctivae are normal. PERRL. EOMI. Head: Atraumatic. Mouth/Throat: Mucous membranes are moist.  Oropharynx non-erythematous. Neck: No stridor.  No cervical spine tenderness to palpation. Cardiovascular: Normal rate, regular rhythm. Good peripheral circulation. Grossly normal heart sounds. Respiratory: Normal respiratory effort.  No retractions. Lungs CTAB. Gastrointestinal: Soft and nontender. No distention.   Musculoskeletal: No lower extremity tenderness nor edema. No gross deformities of extremities. Neurologic:  Normal speech and language. No gross focal neurologic deficits are appreciated.  Skin:  Skin is warm, dry and intact. No rash noted. Psychiatric: Mood and affect are normal. Speech and behavior are normal.  ____________________________________________   LABS (all labs ordered are listed, but only abnormal results are displayed)  Labs Reviewed  URINE CULTURE - Abnormal; Notable for the following components:      Result Value   Culture >=100,000 COLONIES/mL KLEBSIELLA PNEUMONIAE (*)    Organism ID, Bacteria KLEBSIELLA PNEUMONIAE (*)    All other components within normal limits  CBC - Abnormal; Notable for the following components:   RDW 15.0 (*)    All other components within normal limits  URINALYSIS, COMPLETE (UACMP) WITH MICROSCOPIC - Abnormal; Notable for the following components:   Color, Urine YELLOW (*)    APPearance HAZY (*)    Glucose, UA >=500 (*)    Hgb urine dipstick SMALL (*)    Ketones, ur 5 (*)    Leukocytes, UA TRACE (*)    Bacteria, UA MANY (*)    All other components within normal limits  BASIC METABOLIC PANEL - Abnormal; Notable for the following components:   Glucose, Bld 214 (*)    All other components within  normal limits  HEMOGLOBIN A1C - Abnormal; Notable for the following components:   Hgb A1c MFr Bld 9.7 (*)    All other components within normal limits  GLUCOSE, CAPILLARY - Abnormal; Notable for the following components:   Glucose-Capillary 217 (*)    All other components within normal limits  GLUCOSE, CAPILLARY - Abnormal; Notable for the following components:   Glucose-Capillary 217 (*)    All other components within normal limits  GLUCOSE, CAPILLARY - Abnormal; Notable for the following components:   Glucose-Capillary 268 (*)    All other components within normal limits  VITAMIN D 25 HYDROXY (VIT D DEFICIENCY, FRACTURES) - Abnormal; Notable for the following components:   Vit D, 25-Hydroxy 14.0 (*)    All other components within normal limits  GLUCOSE, CAPILLARY - Abnormal; Notable for the following components:   Glucose-Capillary  287 (*)    All other components within normal limits  GLUCOSE, CAPILLARY - Abnormal; Notable for the following components:   Glucose-Capillary 157 (*)    All other components within normal limits  GLUCOSE, CAPILLARY - Abnormal; Notable for the following components:   Glucose-Capillary 205 (*)    All other components within normal limits  GLUCOSE, CAPILLARY - Abnormal; Notable for the following components:   Glucose-Capillary 205 (*)    All other components within normal limits  GLUCOSE, CAPILLARY - Abnormal; Notable for the following components:   Glucose-Capillary 251 (*)    All other components within normal limits  TROPONIN I  TSH   ____________________________________________  EKG  ED ECG REPORT I, [place] N BROWN, the attending physician, personally viewed and interpreted this ECG.   Date: 02/12/2017  EKG Time: 12:24 AM  Rate: 94  Rhythm: Normal sinus rhythm  Axis: Normal  Intervals: Normal  ST&T Change: None  ____________________________________________  RADIOLOGY I, [place] N BROWN, personally viewed and evaluated these images  (plain radiographs) as part of my medical decision making, as well as reviewing the written report by the radiologist.  CLINICAL DATA:  Status post fall, with concern for head injury. Initial encounter.  EXAM: CT HEAD WITHOUT CONTRAST  TECHNIQUE: Contiguous axial images were obtained from the base of the skull through the vertex without intravenous contrast.  COMPARISON:  CT of the head performed 11/14/2016  FINDINGS: Brain: No evidence of acute infarction, hemorrhage, hydrocephalus, extra-axial collection or mass lesion/mass effect.  Prominence of the ventricles and sulci reflects mild to moderate cortical volume loss. Scattered periventricular and subcortical white matter change likely reflects small vessel ischemic microangiopathy. A chronic lacunar infarct is noted at the right basal ganglia.  The brainstem and fourth ventricle are within normal limits. The cerebral hemispheres demonstrate grossly normal gray-white differentiation. No mass effect or midline shift is seen.  Vascular: No hyperdense vessel or unexpected calcification.  Skull: There is no evidence of fracture; visualized osseous structures are unremarkable in appearance.  Sinuses/Orbits: The visualized portions of the orbits are within normal limits. There is opacification of the left mastoid air cells, with fluid tracking about the bones of the middle ear. This is stable from multiple prior studies. The paranasal sinuses and right mastoid air cells are well-aerated.  Other: No significant soft tissue abnormalities are seen.  IMPRESSION: 1. No acute intracranial pathology seen on CT. 2. Mild to moderate cortical volume loss and scattered small vessel ischemic microangiopathy. 3. Chronic lacunar infarct at the right basal ganglia. 4. Opacification of the left mastoid air cells, with fluid tracking about the bones of the left middle ear, stable from multiple  prior studies.   Electronically Signed   By: Garald Balding M.D.   On: 01/27/2017 00:47 __________ Procedures   ____________________________________________   INITIAL IMPRESSION / ASSESSMENT AND PLAN / ED COURSE  As part of my medical decision making, I reviewed the following data within the electronic MEDICAL RECORD NUMBER 76 year old female presenting with above stated history of physical exam following syncopal episode. Laboratory data EKG x-rays and CT scan of the head unremarkable. Patient discussed with Dr. Marcille Blanco for hospital admission further evaluation and management of potential syncope     ____________________________________________  FINAL CLINICAL IMPRESSION(S) / ED DIAGNOSES  Final diagnoses:  Syncope     MEDICATIONS GIVEN DURING THIS VISIT:  Medications - No data to display   ED Discharge Orders        Ordered    aspirin  EC 81 MG tablet  Daily     01/28/17 0852    cefUROXime (CEFTIN) 500 MG tablet  2 times daily with meals     01/28/17 0852    cholecalciferol (VITAMIN D) 1000 units tablet  Daily     01/28/17 1328    metFORMIN (GLUCOPHAGE) 1000 MG tablet  2 times daily with meals     01/28/17 1352       Note:  This document was prepared using Dragon voice recognition software and may include unintentional dictation errors.Gregor Hams, MD 02/12/17 1345

## 2017-02-13 DIAGNOSIS — E119 Type 2 diabetes mellitus without complications: Secondary | ICD-10-CM | POA: Diagnosis not present

## 2017-02-13 DIAGNOSIS — N390 Urinary tract infection, site not specified: Secondary | ICD-10-CM | POA: Diagnosis not present

## 2017-02-13 DIAGNOSIS — M1990 Unspecified osteoarthritis, unspecified site: Secondary | ICD-10-CM | POA: Diagnosis not present

## 2017-02-13 DIAGNOSIS — I10 Essential (primary) hypertension: Secondary | ICD-10-CM | POA: Diagnosis not present

## 2017-02-13 DIAGNOSIS — E785 Hyperlipidemia, unspecified: Secondary | ICD-10-CM | POA: Diagnosis not present

## 2017-02-13 DIAGNOSIS — R55 Syncope and collapse: Secondary | ICD-10-CM | POA: Diagnosis not present

## 2017-02-17 ENCOUNTER — Telehealth (HOSPITAL_COMMUNITY): Payer: Self-pay

## 2017-02-17 NOTE — Telephone Encounter (Signed)
error 

## 2017-02-19 DIAGNOSIS — I10 Essential (primary) hypertension: Secondary | ICD-10-CM | POA: Diagnosis not present

## 2017-02-19 DIAGNOSIS — R55 Syncope and collapse: Secondary | ICD-10-CM | POA: Diagnosis not present

## 2017-02-19 DIAGNOSIS — E785 Hyperlipidemia, unspecified: Secondary | ICD-10-CM | POA: Diagnosis not present

## 2017-02-19 DIAGNOSIS — M1990 Unspecified osteoarthritis, unspecified site: Secondary | ICD-10-CM | POA: Diagnosis not present

## 2017-02-19 DIAGNOSIS — N390 Urinary tract infection, site not specified: Secondary | ICD-10-CM | POA: Diagnosis not present

## 2017-02-19 DIAGNOSIS — E119 Type 2 diabetes mellitus without complications: Secondary | ICD-10-CM | POA: Diagnosis not present

## 2017-02-20 ENCOUNTER — Telehealth: Payer: Self-pay | Admitting: Family Medicine

## 2017-02-20 NOTE — Telephone Encounter (Signed)
Copied from [place] [id number]. Topic: Quick Communication - See Telephone Encounter >> Feb 20, 2017  1:28 PM Robina Ade, Helene Kelp D wrote: CRM for notification. See Telephone encounter for: 02/20/17. Cherly Anderson with [facility] called to let provider know that pt. Son cancel PT today for patient due to his work schedule. If there are any question she can be reached at [phone].

## 2017-02-20 NOTE — Telephone Encounter (Signed)
FYI

## 2017-02-24 ENCOUNTER — Telehealth: Payer: Self-pay

## 2017-02-24 NOTE — Telephone Encounter (Signed)
Informed Hilda Blades Dr.Sonnenberg will be out of the office until 03/03/17 and will advise at that time Form in red folder

## 2017-02-24 NOTE — Telephone Encounter (Signed)
Copied from [place] #[id number]. Topic: Inquiry >> Feb 24, 2017 10:18 AM Corie Chiquito, [place] wrote: Reason for CRM: Hilda Blades from  [place] called to check on the paper work that she had faxed over. She also would like to know if she could get a verbal order for this patient to do a home visit. If someone could give her a call back at [phone] or at [phone]

## 2017-02-27 DIAGNOSIS — I10 Essential (primary) hypertension: Secondary | ICD-10-CM | POA: Diagnosis not present

## 2017-02-27 DIAGNOSIS — M1990 Unspecified osteoarthritis, unspecified site: Secondary | ICD-10-CM | POA: Diagnosis not present

## 2017-02-27 DIAGNOSIS — R55 Syncope and collapse: Secondary | ICD-10-CM | POA: Diagnosis not present

## 2017-02-27 DIAGNOSIS — E119 Type 2 diabetes mellitus without complications: Secondary | ICD-10-CM | POA: Diagnosis not present

## 2017-02-27 DIAGNOSIS — E785 Hyperlipidemia, unspecified: Secondary | ICD-10-CM | POA: Diagnosis not present

## 2017-02-27 DIAGNOSIS — N390 Urinary tract infection, site not specified: Secondary | ICD-10-CM | POA: Diagnosis not present

## 2017-03-03 NOTE — Telephone Encounter (Signed)
Verbal order can be given for a home visit.

## 2017-03-04 NOTE — Telephone Encounter (Signed)
Hilda Blades states the patient has been discharged but she will try to get some help for the patient, disregard form

## 2017-03-09 ENCOUNTER — Other Ambulatory Visit: Payer: Self-pay | Admitting: Family Medicine

## 2017-04-01 ENCOUNTER — Ambulatory Visit: Payer: Medicare Other

## 2017-05-18 ENCOUNTER — Encounter: Payer: Self-pay | Admitting: Medical Oncology

## 2017-05-18 ENCOUNTER — Emergency Department
Admission: EM | Admit: 2017-05-18 | Discharge: 2017-05-18 | Disposition: A | Discharge status: Home / Self Care | Payer: Medicare Other | Attending: Emergency Medicine | Admitting: Emergency Medicine

## 2017-05-18 DIAGNOSIS — Z7984 Long term (current) use of oral hypoglycemic drugs: Secondary | ICD-10-CM | POA: Diagnosis not present

## 2017-05-18 DIAGNOSIS — I10 Essential (primary) hypertension: Secondary | ICD-10-CM | POA: Insufficient documentation

## 2017-05-18 DIAGNOSIS — E876 Hypokalemia: Secondary | ICD-10-CM | POA: Insufficient documentation

## 2017-05-18 DIAGNOSIS — R102 Pelvic and perineal pain: Secondary | ICD-10-CM | POA: Diagnosis present

## 2017-05-18 DIAGNOSIS — Z79899 Other long term (current) drug therapy: Secondary | ICD-10-CM | POA: Diagnosis not present

## 2017-05-18 DIAGNOSIS — N390 Urinary tract infection, site not specified: Secondary | ICD-10-CM | POA: Diagnosis not present

## 2017-05-18 DIAGNOSIS — F0390 Unspecified dementia without behavioral disturbance: Secondary | ICD-10-CM | POA: Insufficient documentation

## 2017-05-18 DIAGNOSIS — R1030 Lower abdominal pain, unspecified: Secondary | ICD-10-CM | POA: Diagnosis not present

## 2017-05-18 DIAGNOSIS — Z87891 Personal history of nicotine dependence: Secondary | ICD-10-CM | POA: Insufficient documentation

## 2017-05-18 DIAGNOSIS — E119 Type 2 diabetes mellitus without complications: Secondary | ICD-10-CM | POA: Insufficient documentation

## 2017-05-18 DIAGNOSIS — Z7982 Long term (current) use of aspirin: Secondary | ICD-10-CM | POA: Diagnosis not present

## 2017-05-18 DIAGNOSIS — E875 Hyperkalemia: Secondary | ICD-10-CM | POA: Diagnosis not present

## 2017-05-18 LAB — COMPREHENSIVE METABOLIC PANEL
ALT: 12 U/L — ABNORMAL LOW (ref 14–54)
AST: 20 U/L (ref 15–41)
Albumin: 3.7 g/dL (ref 3.5–5.0)
Alkaline Phosphatase: 56 U/L (ref 38–126)
Anion gap: 12 (ref 5–15)
BUN: 14 mg/dL (ref 6–20)
CHLORIDE: 104 mmol/L (ref 101–111)
CO2: 22 mmol/L (ref 22–32)
Calcium: 9.1 mg/dL (ref 8.9–10.3)
Creatinine, Ser: 0.56 mg/dL (ref 0.44–1.00)
GFR calc Af Amer: >60 mL/min (ref >60)
GFR calc non Af Amer: >60 mL/min (ref >60)
GLUCOSE: 278 mg/dL — AB (ref 65–99)
POTASSIUM: 3.2 mmol/L — AB (ref 3.5–5.1)
Sodium: 138 mmol/L (ref 135–145)
Total Bilirubin: 0.8 mg/dL (ref 0.3–1.2)
Total Protein: 6.8 g/dL (ref 6.5–8.1)

## 2017-05-18 LAB — URINALYSIS, COMPLETE (UACMP) WITH MICROSCOPIC
BILIRUBIN URINE: NEGATIVE
Glucose, UA: >=500 mg/dL — AB
Hgb urine dipstick: NEGATIVE
Ketones, ur: NEGATIVE mg/dL
LEUKOCYTES UA: NEGATIVE
Nitrite: POSITIVE — AB
PROTEIN: NEGATIVE mg/dL
SPECIFIC GRAVITY, URINE: 1.026 (ref 1.005–1.030)
pH: 5 (ref 5.0–8.0)

## 2017-05-18 LAB — CBC
HEMATOCRIT: 42.9 % (ref 35.0–47.0)
Hemoglobin: 14.1 g/dL (ref 12.0–16.0)
MCH: 28.8 pg (ref 26.0–34.0)
MCHC: 32.8 g/dL (ref 32.0–36.0)
MCV: 87.8 fL (ref 80.0–100.0)
Platelets: 142 10*3/uL — ABNORMAL LOW (ref 150–440)
RBC: 4.88 MIL/uL (ref 3.80–5.20)
RDW: 15.4 % — AB (ref 11.5–14.5)
WBC: 10.1 10*3/uL (ref 3.6–11.0)

## 2017-05-18 LAB — LIPASE, BLOOD: LIPASE: 27 U/L (ref 11–51)

## 2017-05-18 MED ORDER — CEPHALEXIN 500 MG PO CAPS
500.0000 mg | ORAL_CAPSULE | Freq: Once | ORAL | Status: AC
  Administered 2017-05-18: 500 mg via ORAL
  Filled 2017-05-18: qty 1

## 2017-05-18 MED ORDER — CEPHALEXIN 500 MG PO CAPS: 500.0000 mg | ORAL_CAPSULE | Freq: Two times a day (BID) | ORAL | 0 refills | Status: DC

## 2017-05-18 MED ORDER — POTASSIUM CHLORIDE CRYS ER 20 MEQ PO TBCR
40.0000 meq | EXTENDED_RELEASE_TABLET | Freq: Once | ORAL | Status: AC
  Administered 2017-05-18: 40 meq via ORAL
  Filled 2017-05-18: qty 2

## 2017-05-18 MED ORDER — POTASSIUM CHLORIDE CRYS ER 20 MEQ PO TBCR: 20.0000 meq | EXTENDED_RELEASE_TABLET | Freq: Every day | ORAL | 0 refills | Status: DC

## 2017-05-18 NOTE — ED Provider Notes (Signed)
[facility] Emergency Department Provider Note  ____________________________________________   First MD Initiated Contact with Patient 05/18/17 1545     (approximate)  I have reviewed the triage vital signs and the nursing notes.   HISTORY  Chief Complaint Back Pain and Abdominal Pain  Level 5 caveat:  history/ROS may limited by chronic dementia  HPI Lisa Hahn is a 77 y.o. female with medical issues as listed below including mild dementia who presents for evaluation of some lower abdominal pain that radiates to her back.  The symptoms have apparently been going on for about a week.  She reports that it sometimes burns when she urinates.  Particular makes her symptoms better or worse.  She denies fever/chills, chest pain, shortness of breath, nausea, vomiting, and upper abdominal pain.  She describes the pain as mild to moderate.  Nothing in particular makes it better or worse.  Her son took me aside and said that he is worried about her and has been having gradually worsening issues caring for her at home.  He states that she refuses to allow him to help her with hygiene and that he would like to have her placed in a facility.  He says that he talked to a place in which she is interested, but they told him the patient has to come to the emergency department.  Past Medical History:  Diagnosis Date  . Arthritis   . Diabetes mellitus without complication ([place])   . Hypertension   . Stroke [facility])     Patient Active Problem List   Diagnosis Date Noted  . UTI (urinary tract infection) 02/03/2017  . Syncope 01/27/2017  . Decreased pedal pulses 08/22/2016  . Hyperlipidemia 08/22/2016  . Poor dentition 04/16/2016  . Abdominal pain 04/16/2016  . Onychomycosis 04/16/2016  . Lightheadedness 08/15/2015  . Abdominal pain, chronic, epigastric 08/15/2015  . Dementia 07/25/2015  . Chest pain 06/28/2015  . Strain of left quadriceps tendon 06/28/2015  . Type 2  diabetes mellitus ([place]) 06/28/2015  . Vision changes 06/28/2015    Past Surgical History:  Procedure Laterality Date  . ABDOMINAL HYSTERECTOMY    . ABDOMINAL SURGERY     blockage     Prior to Admission medications   Medication Sig Start Date End Date Taking? Authorizing Provider  acetaminophen (TYLENOL) 325 MG tablet Take 2 tablets (650 mg total) by mouth every 6 (six) hours as needed. Patient taking differently: Take 650 mg by mouth every 6 (six) hours as needed for mild pain.  11/23/16   Carrie Mew, MD  aspirin EC 81 MG tablet Take 1 tablet (81 mg total) by mouth daily. 01/28/17   Dustin Flock, MD  atorvastatin (LIPITOR) 40 MG tablet Take 40 mg by mouth daily.    [provider]  blood glucose meter kit and supplies KIT Dispense based on patient and insurance preference. Test 3 times daily. ICD 10 code E11.9. 01/31/17   Leone Haven, MD  cephALEXin (KEFLEX) 500 MG capsule Take 1 capsule (500 mg total) by mouth 2 (two) times daily. 05/18/17   Hinda Kehr, MD  cholecalciferol (VITAMIN D) 1000 units tablet Take 1 tablet (1,000 Units total) by mouth daily. 01/28/17   Dustin Flock, MD  JANUVIA 100 MG tablet Take 100 mg by mouth daily. 12/04/16   [provider]  metFORMIN (GLUCOPHAGE) 1000 MG tablet Take 1 tablet (1,000 mg total) by mouth 2 (two) times daily with a meal. 01/28/17   Dustin Flock, MD  ONE  TOUCH ULTRA TEST test strip USE TO TEST BLOOD SUGAR 3 TIMES DAILY 03/10/17   Leone Haven, MD  [facility] DELICA LANCETS FINE MISC USE TO TEST BLOOD SUGAR 3 TIMES DAILY 03/10/17   Leone Haven, MD  potassium chloride SA (KLOR-CON M20) 20 MEQ tablet Take 1 tablet (20 mEq total) by mouth daily. 05/18/17   Hinda Kehr, MD  traMADol (ULTRAM) 50 MG tablet Take 50 mg by mouth every 6 (six) hours as needed.    [provider]    Allergies Aspirin and Motrin [ibuprofen]  Family History  Problem Relation Age of Onset  . Arthritis Unknown          Parent    Social History Social History   Tobacco Use  . Smoking status: Former Research scientist (life sciences)  . Smokeless tobacco: Never Used  Substance Use Topics  . Alcohol use: No  . Drug use: No    Review of Systems Level 5 caveat:  history/ROS may limited by chronic dementia  Constitutional: No fever/chills Eyes: No visual changes. ENT: No sore throat. Cardiovascular: Denies chest pain. Respiratory: Denies shortness of breath. Gastrointestinal: Lower middle abdominal pain (suprapubic), no nausea, vomiting, nor diarrhea Genitourinary: Occasional dysuria. Musculoskeletal: Negative for neck pain.  Pain from lower abdomen radiating into the back Integumentary: Negative for rash. Neurological: Negative for headaches, focal weakness or numbness.   ____________________________________________   PHYSICAL EXAM:  VITAL SIGNS: ED Triage Vitals  Enc Vitals Group     BP 05/18/17 1359 (!) 149/117     Pulse Rate 05/18/17 1359 91     Resp 05/18/17 1359 20     Temp 05/18/17 1359 98.4 F (36.9 C)     Temp Source 05/18/17 1359 Oral     SpO2 05/18/17 1359 96 %     Weight 05/18/17 1403 75.8 kg (167 lb)     Height --      Head Circumference --      Peak Flow --      Pain Score 05/18/17 1403 10     Pain Loc --      Pain Edu? --      Excl. in [place]? --     Constitutional: Alert. Well appearing and in no acute distress. Eyes: Conjunctivae are normal.  Head: Atraumatic. Nose: No congestion/rhinnorhea. Mouth/Throat: Mucous membranes are moist. Neck: No stridor.  No meningeal signs.   Cardiovascular: Normal rate, regular rhythm. Good peripheral circulation. Grossly normal heart sounds. Respiratory: Normal respiratory effort.  No retractions. Lungs CTAB. Gastrointestinal: Soft and nontender except for mild tenderness in the suprapubic region.  No rebound and no guarding.  No distention Musculoskeletal: No lower extremity tenderness nor edema. No gross deformities of extremities. Neurologic:   Normal speech and language. No gross focal neurologic deficits are appreciated.  Skin:  Skin is warm, dry and intact except for a small circular wound just below her waist band in the suprapubic region that appears chronic with no purulent discharge at this time.  There is some indurated skin around the wound but no fluctuance.   ____________________________________________   LABS (all labs ordered are listed, but only abnormal results are displayed)  Labs Reviewed  COMPREHENSIVE METABOLIC PANEL - Abnormal; Notable for the following components:      Result Value   Potassium 3.2 (*)    Glucose, Bld 278 (*)    ALT 12 (*)    All other components within normal limits  CBC - Abnormal; Notable for the following components:   RDW  15.4 (*)    Platelets 142 (*)    All other components within normal limits  URINALYSIS, COMPLETE (UACMP) WITH MICROSCOPIC - Abnormal; Notable for the following components:   Color, Urine YELLOW (*)    APPearance HAZY (*)    Glucose, UA >=500 (*)    Nitrite POSITIVE (*)    Bacteria, UA MANY (*)    Squamous Epithelial / LPF 0-5 (*)    All other components within normal limits  URINE CULTURE  LIPASE, BLOOD   ____________________________________________  EKG  None - EKG not ordered by ED physician ____________________________________________  RADIOLOGY   ED MD interpretation: No imaging ordered  Official radiology report(s): No results found.  ____________________________________________   PROCEDURES  Critical Care performed: No   Procedure(s) performed:   Procedures   ____________________________________________   INITIAL IMPRESSION / ASSESSMENT AND PLAN / ED COURSE  As part of my medical decision making, I reviewed the following data within the [place] History obtained from family, Nursing notes reviewed and incorporated, Labs reviewed , A consult was requested and obtained from this/these consultant(s) (social work)  and Notes from prior ED visits    Patient is well-appearing and in no acute distress.  I am awaiting the results of her urinalysis because I think UTI is the most likely diagnosis.  Intra-abdominal pathology such as diverticulitis or appendicitis is also possible but I think unlikely based on the physical exam.  She has no leukocytosis and her labs are all generally reassuring except for a mild hypokalemia.  The son is concerned about placement and I will consult social work to talk to him but at this point there is no indication of an acute or emergent medical condition requiring admission and most likely she will need follow-up for placement as an outpatient.  Clinical Course as of May 18 2117  Mon May 18, 2017  1614 Spoke by phone with Brayton Layman the social worker who is going to come down and speak with the patient and her son about the placement issue.  We will need to check a urinalysis preferably by in and out catheter which I have ordered.  [CF]  [place] spoke with the patient's son and helped work on plans to be placed as an outpatient under Medicaid.  He understands that she will not be able to be placed from the emergency department.  Her lab workup is generally reassuring with some mild hypokalemia and a nitrite positive urinalysis.  I will treat her with Keflex and plan to discharge with outpatient follow-up.  [CF]    Clinical Course User Index [CF] Hinda Kehr, MD    ____________________________________________  FINAL CLINICAL IMPRESSION(S) / ED DIAGNOSES  Final diagnoses:  Urinary tract infection without hematuria, site unspecified  Suprapubic pain  Hypokalemia     MEDICATIONS GIVEN DURING THIS VISIT:  Medications  cephALEXin (KEFLEX) capsule 500 mg (500 mg Oral Given 05/18/17 1823)  potassium chloride SA (K-DUR,KLOR-CON) CR tablet 40 mEq (40 mEq Oral Given 05/18/17 1823)     ED Discharge Orders        Ordered    cephALEXin (KEFLEX) 500 MG capsule  2 times daily       05/18/17 1912    potassium chloride SA (KLOR-CON M20) 20 MEQ tablet  Daily     05/18/17 1913       Note:  This document was prepared using Dragon voice recognition software and may include unintentional dictation errors.    Hinda Kehr, MD 05/18/17  2119  

## 2017-05-18 NOTE — ED Triage Notes (Signed)
Pt reports lower back pain and lower abd pain that began about 1 week ago. Denies fever and NVD.

## 2017-05-18 NOTE — ED Notes (Addendum)
Discharge instructions reviewed face to face with son Genesia Caslin.

## 2017-05-18 NOTE — Clinical Social Work Note (Signed)
CSW asked to speak with patient's son: Nasiah Polinsky: [phone]. CSW came down to patient's ED room and spoke with Mr. Vlachos outside of patient's room. Patient has dementia and son is concerned about caring for her at home. CSW spoke with patient's son and explained that the physician has stated patient more than likely will be medically cleared and thus will need to discharge home today. CSW explained levels of care and resources with Mr. Rossi. He understands that patient cannot be placed under her medicare because she has not had a qualifying inpatient 3 night hospital stay. He stated he spoke with Claiborne Billings at [facility] and so CSW called Claiborne Billings with Mr. Boran present. CSW explained patient could not enter her facility under medicare but could under medicaid. Claiborne Billings offered to work with patient's son to bring patient in under medicaid in the near future. CSW will assist patient's son with completing FL2 and fowarding to [place]. Patient's son very appreciative for assistance. Shela Leff MSW,LcSW [phone]

## 2017-05-18 NOTE — Discharge Instructions (Signed)
You have been seen in the Emergency Department (ED) today for pain when urinating and lower abdominal pain.  Your workup today suggests that you have a urinary tract infection (UTI), but fortunately the rest of your workup was reassuring.  Please take your antibiotic as prescribed and over-the-counter pain medication (Tylenol or Motrin) as needed, but no more than recommended on the label instructions.  Drink PLENTY of fluids.  Please see your primary care doctor for further management of the small skin wound along your belt line.  Call your regular doctor to schedule the next available appointment to follow up on today?s ED visit, or return immediately to the ED if your pain worsens, you have decreased urine production, develop fever, persistent vomiting, or other symptoms that concern you.

## 2017-05-19 ENCOUNTER — Telehealth: Payer: Self-pay

## 2017-05-19 NOTE — Telephone Encounter (Signed)
Left message to return call, ok for pec to speak to patients son about the message below  Please find out if he has a assisted living facility that he is using in particular.  I am happy to provide a letter though assisted living facilities typically need an FL2 filled out and have more paperwork that needs to be done other than just a letter.  If he has picked a facility out he should check with them to see what they require.  Thanks.

## 2017-05-19 NOTE — Telephone Encounter (Signed)
Spoke with patient's son, made aware that he needs to pick a facility and find out what paperwork is needed. Patient's verbalized understanding and state that he will do that.

## 2017-05-19 NOTE — Telephone Encounter (Signed)
Copied from [facility]. Topic: General - Other >> May 19, 2017 12:45 PM Cecelia Byars, NT wrote: Reason for CRM: Patients son called and would like a call from Dr Caryl Bis , he is needing a letter of a referral to place her in an assistant living  facility  with her diagnosis . Please advise [phone]

## 2017-05-19 NOTE — Telephone Encounter (Signed)
Please find out if he has a assisted living facility that he is using in particular.  I am happy to provide a letter though assisted living facilities typically need an FL2 filled out and have more paperwork that needs to be done other than just a letter.  If he has picked a facility out he should check with them to see what they require.  Thanks.

## 2017-05-19 NOTE — Telephone Encounter (Signed)
Please advise 

## 2017-05-19 NOTE — Telephone Encounter (Signed)
Dr.Sonnenberg pt. 

## 2017-05-19 NOTE — NC FL2 (Signed)
Talent LEVEL OF CARE SCREENING TOOL     IDENTIFICATION  Patient Name: Lisa Hahn Birthdate: October 25, 1940 Sex: female Admission Date (Current Location): 05/18/2017  [facility] and [place] Number:  Engineering geologist and Address:  [facility], [address] [place], [place] [address]      Provider Number: [id number]  Attending Physician Name and Address:  No att. providers found  Relative Name and Phone Number:       Current Level of Care: Home Recommended Level of Care: [place] Prior Approval Number:    Date Approved/Denied:   PASRR Number: [id number]  Discharge Plan: SNF    Current Diagnoses: Patient Active Problem List   Diagnosis Date Noted  . UTI (urinary tract infection) 02/03/2017  . Syncope 01/27/2017  . Decreased pedal pulses 08/22/2016  . Hyperlipidemia 08/22/2016  . Poor dentition 04/16/2016  . Abdominal pain 04/16/2016  . Onychomycosis 04/16/2016  . Lightheadedness 08/15/2015  . Abdominal pain, chronic, epigastric 08/15/2015  . Dementia 07/25/2015  . Chest pain 06/28/2015  . Strain of left quadriceps tendon 06/28/2015  . Type 2 diabetes mellitus ([place]) 06/28/2015  . Vision changes 06/28/2015    Orientation RESPIRATION BLADDER Height & Weight     Self, Place  Normal Incontinent Weight: 167 lb (75.8 kg) Height:     BEHAVIORAL SYMPTOMS/MOOD NEUROLOGICAL BOWEL NUTRITION STATUS  (none) (none) Incontinent Diet(regular)  AMBULATORY STATUS COMMUNICATION OF NEEDS Skin   Extensive Assist Verbally Normal                       Personal Care Assistance Level of Assistance  Dressing, Bathing Bathing Assistance: Maximum assistance   Dressing Assistance: Maximum assistance     Functional Limitations Info  Sight Sight Info: Impaired        SPECIAL CARE FACTORS FREQUENCY                       Contractures Contractures Info: Not present    Additional Factors Info                   Current Medications (05/19/2017):  This is the current hospital active medication list No current facility-administered medications for this encounter.    Current Outpatient Medications  Medication Sig Dispense Refill  . acetaminophen (TYLENOL) 325 MG tablet Take 2 tablets (650 mg total) by mouth every 6 (six) hours as needed. (Patient taking differently: Take 650 mg by mouth every 6 (six) hours as needed for mild pain. ) 60 tablet 0  . aspirin EC 81 MG tablet Take 1 tablet (81 mg total) by mouth daily.    Marland Kitchen atorvastatin (LIPITOR) 40 MG tablet Take 40 mg by mouth daily.    . blood glucose meter kit and supplies KIT Dispense based on patient and insurance preference. Test 3 times daily. ICD 10 code E11.9. 1 each 0  . cephALEXin (KEFLEX) 500 MG capsule Take 1 capsule (500 mg total) by mouth 2 (two) times daily. 14 capsule 0  . cholecalciferol (VITAMIN D) 1000 units tablet Take 1 tablet (1,000 Units total) by mouth daily. 30 tablet 0  . JANUVIA 100 MG tablet Take 100 mg by mouth daily.  3  . metFORMIN (GLUCOPHAGE) 1000 MG tablet Take 1 tablet (1,000 mg total) by mouth 2 (two) times daily with a meal. 30 tablet 0  . ONE TOUCH ULTRA TEST test strip USE TO TEST BLOOD SUGAR 3 TIMES DAILY 100 each  0  . ONETOUCH DELICA LANCETS FINE MISC USE TO TEST BLOOD SUGAR 3 TIMES DAILY 100 each 0  . potassium chloride SA (KLOR-CON M20) 20 MEQ tablet Take 1 tablet (20 mEq total) by mouth daily. 7 tablet 0  . traMADol (ULTRAM) 50 MG tablet Take 50 mg by mouth every 6 (six) hours as needed.       Discharge Medications: Please see discharge summary for a list of discharge medications.  Relevant Imaging Results:  Relevant Lab Results:   Additional Information [id number]  Shela Leff, LCSW

## 2017-05-21 LAB — URINE CULTURE
Culture: >=100000 — AB
Special Requests: NORMAL

## 2017-05-25 DIAGNOSIS — F0280 Dementia in other diseases classified elsewhere without behavioral disturbance: Secondary | ICD-10-CM | POA: Diagnosis not present

## 2017-05-25 DIAGNOSIS — E1159 Type 2 diabetes mellitus with other circulatory complications: Secondary | ICD-10-CM | POA: Diagnosis not present

## 2017-05-25 DIAGNOSIS — E1169 Type 2 diabetes mellitus with other specified complication: Secondary | ICD-10-CM | POA: Diagnosis not present

## 2017-05-25 DIAGNOSIS — I10 Essential (primary) hypertension: Secondary | ICD-10-CM | POA: Diagnosis not present

## 2017-05-25 DIAGNOSIS — R109 Unspecified abdominal pain: Secondary | ICD-10-CM | POA: Diagnosis not present

## 2017-05-26 DIAGNOSIS — D649 Anemia, unspecified: Secondary | ICD-10-CM | POA: Diagnosis not present

## 2017-05-26 DIAGNOSIS — E119 Type 2 diabetes mellitus without complications: Secondary | ICD-10-CM | POA: Diagnosis not present

## 2017-05-26 DIAGNOSIS — Z79899 Other long term (current) drug therapy: Secondary | ICD-10-CM | POA: Diagnosis not present

## 2017-06-22 DIAGNOSIS — E1169 Type 2 diabetes mellitus with other specified complication: Secondary | ICD-10-CM | POA: Diagnosis not present

## 2017-06-22 DIAGNOSIS — I10 Essential (primary) hypertension: Secondary | ICD-10-CM | POA: Diagnosis not present

## 2017-06-22 DIAGNOSIS — E785 Hyperlipidemia, unspecified: Secondary | ICD-10-CM | POA: Diagnosis not present

## 2017-06-22 DIAGNOSIS — E1159 Type 2 diabetes mellitus with other circulatory complications: Secondary | ICD-10-CM | POA: Diagnosis not present

## 2017-07-24 DIAGNOSIS — I10 Essential (primary) hypertension: Secondary | ICD-10-CM | POA: Diagnosis not present

## 2017-07-24 DIAGNOSIS — E1169 Type 2 diabetes mellitus with other specified complication: Secondary | ICD-10-CM | POA: Diagnosis not present

## 2017-07-24 DIAGNOSIS — F0280 Dementia in other diseases classified elsewhere without behavioral disturbance: Secondary | ICD-10-CM | POA: Diagnosis not present

## 2017-07-24 DIAGNOSIS — E1159 Type 2 diabetes mellitus with other circulatory complications: Secondary | ICD-10-CM | POA: Diagnosis not present

## 2017-08-26 DIAGNOSIS — E1159 Type 2 diabetes mellitus with other circulatory complications: Secondary | ICD-10-CM | POA: Diagnosis not present

## 2017-08-26 DIAGNOSIS — I10 Essential (primary) hypertension: Secondary | ICD-10-CM | POA: Diagnosis not present

## 2017-08-26 DIAGNOSIS — I69998 Other sequelae following unspecified cerebrovascular disease: Secondary | ICD-10-CM | POA: Diagnosis not present

## 2017-08-26 DIAGNOSIS — F0280 Dementia in other diseases classified elsewhere without behavioral disturbance: Secondary | ICD-10-CM | POA: Diagnosis not present

## 2017-09-18 IMAGING — CR DG KNEE COMPLETE 4+V*L*
1 series · 4 of 4 positions shown · non-contrast
Comparison: Plain film of the left femur do dated 10/05/2008.

CLINICAL DATA: Left knee pain for several months, worse yesterday.
No known new injury. Two falls earlier in 4986. Occasional swelling
and warmth.

EXAM:
LEFT KNEE - COMPLETE 4+ VIEW

[Series 1: dg knee complete 4 views left · 0.14mm/px · 4 of 4 slices shown]
[im 1/4]
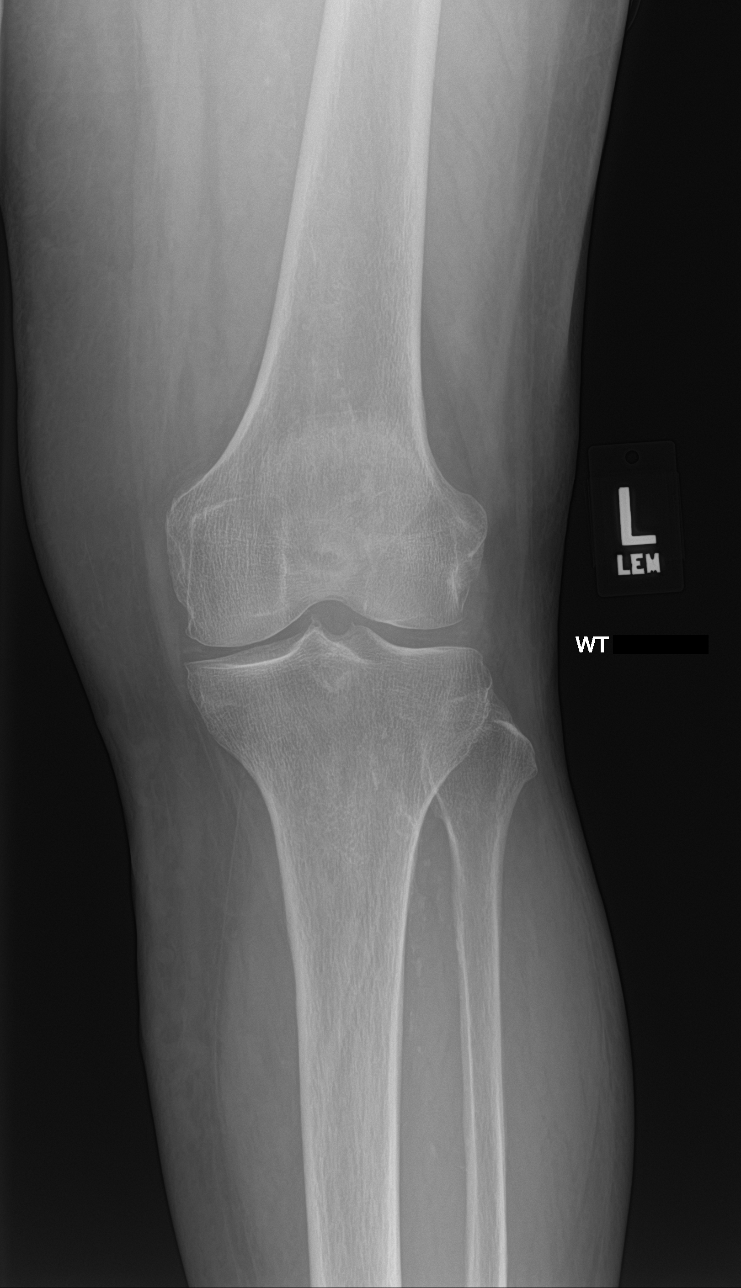
[im 2/4]
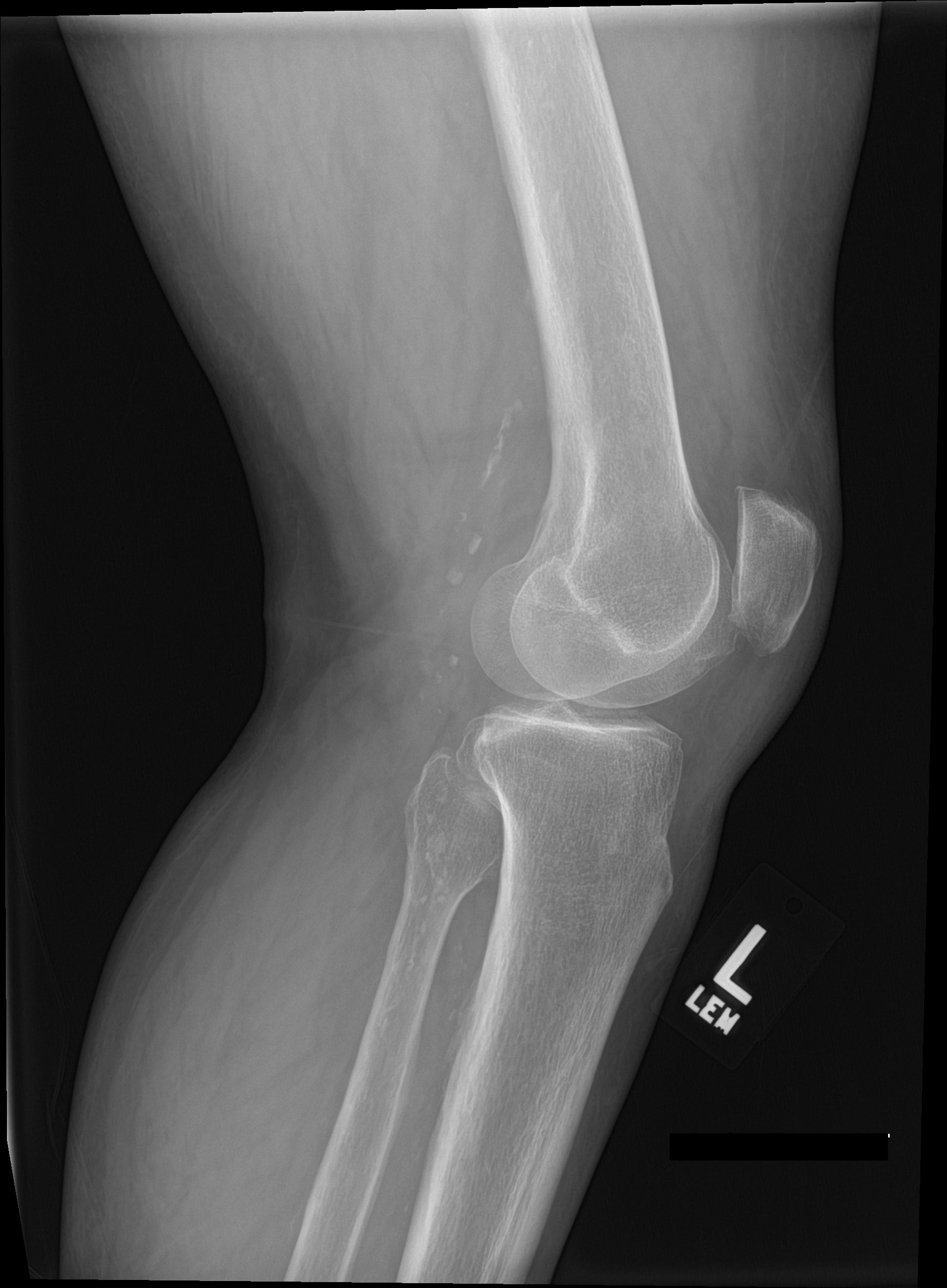
[im 3/4]
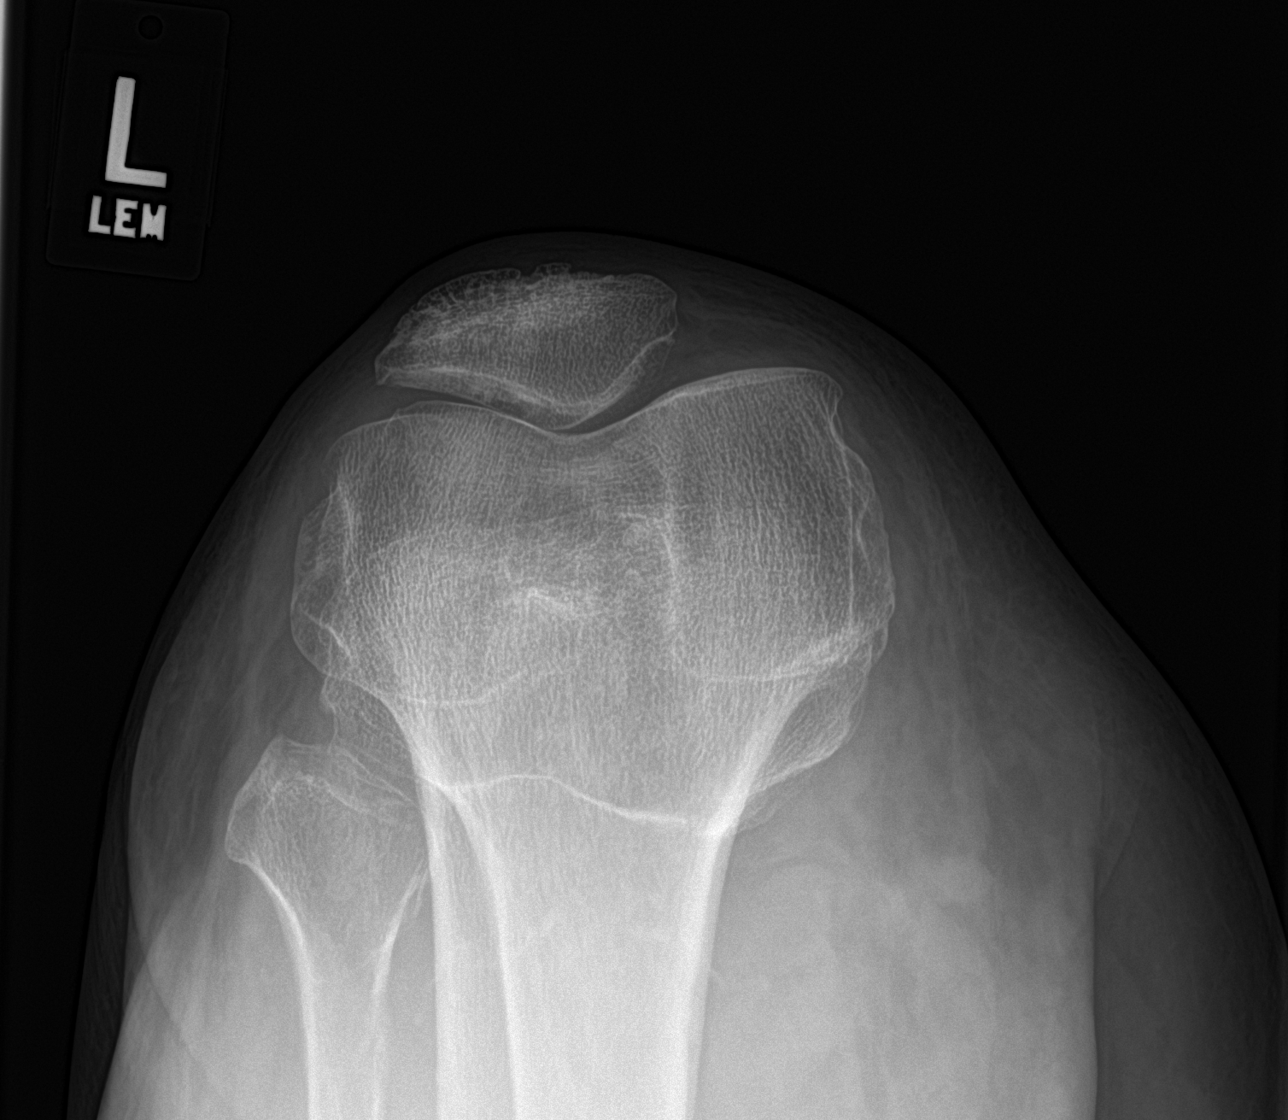
[im 4/4]
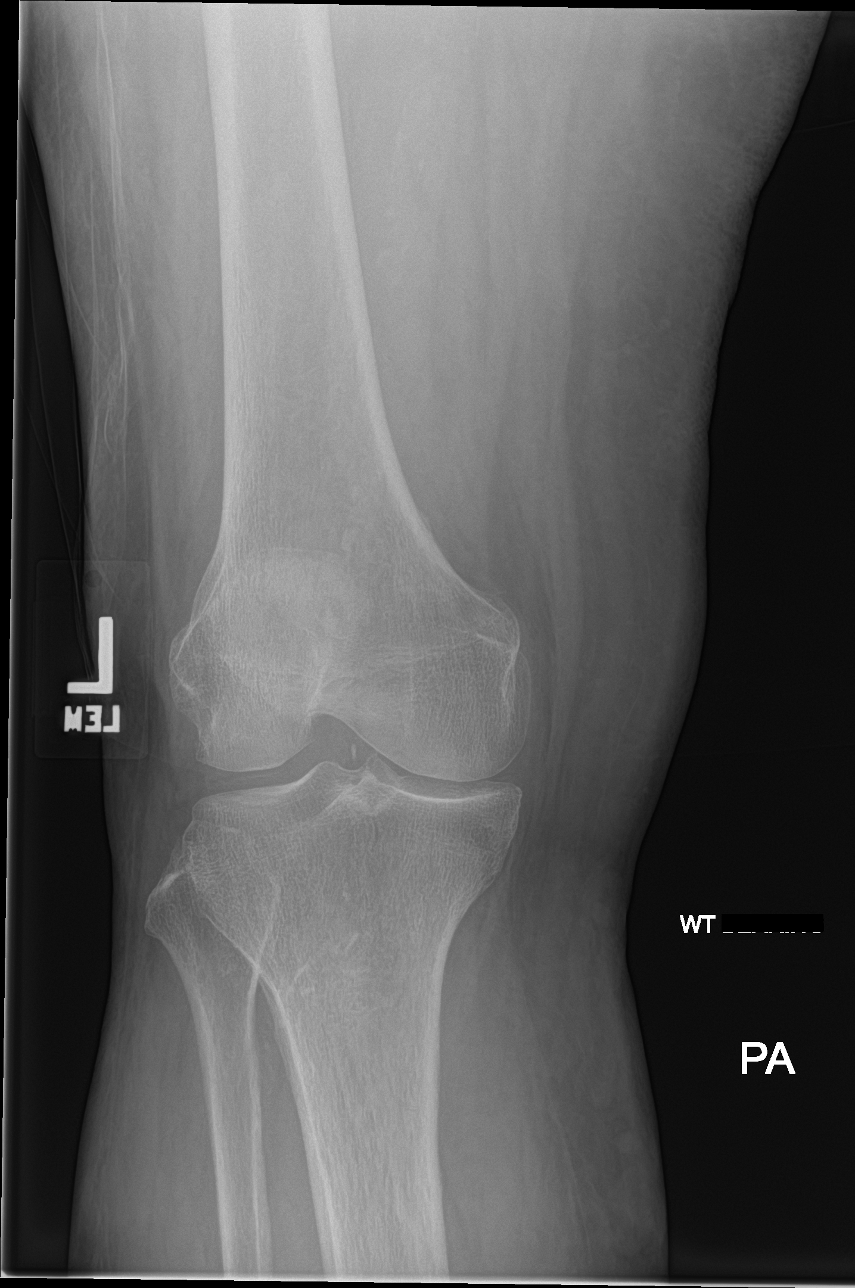

[4 of 4 positions shown; findings below may reference images not displayed]

FINDINGS: Stable mild degenerative narrowing of the medial compartment. Subtle
calcific density within the joint space suggests chondrocalcinosis.

No acute or suspicious osseous lesion. No destructive change or
erosion. No fracture line or displaced fracture fragment. No
appreciable joint effusion. Atherosclerotic changes again noted
within the adjacent soft tissues.
IMPRESSION: 1. No acute findings.
2. Probable chondrocalcinosis suggesting underlying CPPD. Stable
mild narrowing of the medial compartment.

## 2017-09-28 DIAGNOSIS — I10 Essential (primary) hypertension: Secondary | ICD-10-CM | POA: Diagnosis not present

## 2017-09-28 DIAGNOSIS — F0280 Dementia in other diseases classified elsewhere without behavioral disturbance: Secondary | ICD-10-CM | POA: Diagnosis not present

## 2017-09-28 DIAGNOSIS — E1159 Type 2 diabetes mellitus with other circulatory complications: Secondary | ICD-10-CM | POA: Diagnosis not present

## 2017-09-28 DIAGNOSIS — E1169 Type 2 diabetes mellitus with other specified complication: Secondary | ICD-10-CM | POA: Diagnosis not present

## 2017-09-30 DIAGNOSIS — Z79899 Other long term (current) drug therapy: Secondary | ICD-10-CM | POA: Diagnosis not present

## 2017-10-01 DIAGNOSIS — Z79899 Other long term (current) drug therapy: Secondary | ICD-10-CM | POA: Diagnosis not present

## 2017-10-01 DIAGNOSIS — D649 Anemia, unspecified: Secondary | ICD-10-CM | POA: Diagnosis not present

## 2017-10-11 IMAGING — CT CT HEAD W/O CM
1 series · 15 of 30 positions shown, 19 images · non-contrast
Comparison: CT of the head performed 07/20/2009

CLINICAL DATA: Acute onset of confusion and headache. Initial
encounter.

EXAM:
CT HEAD WITHOUT CONTRAST
TECHNIQUE: Contiguous axial images were obtained from the base of the skull
through the vertex without intravenous contrast.

[Series 2: head wo · axial · 0.47mm/px · z∈[-156,-26]mm · 15 of 30 slices shown, 19 images]
[im 2/30  brain]
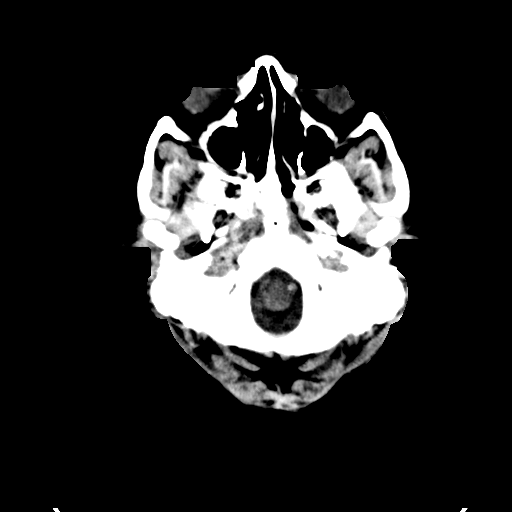
[im 2/30  bone]
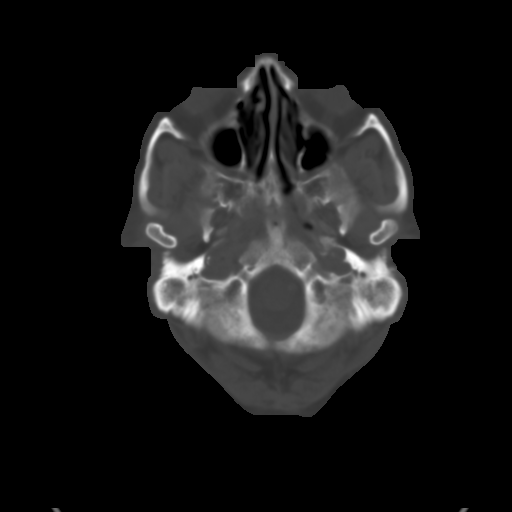
[im 4/30  brain]
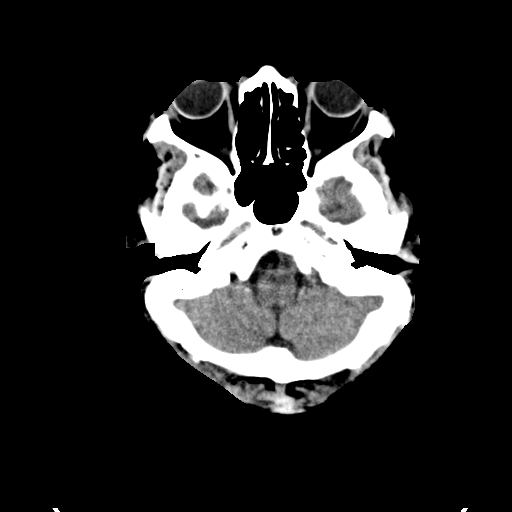
[im 6/30  brain]
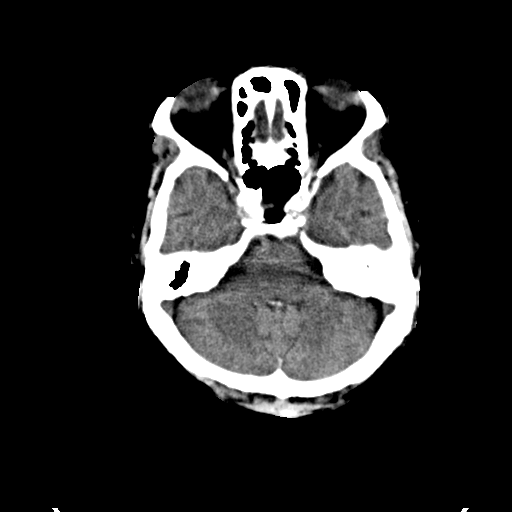
[im 8/30  brain]
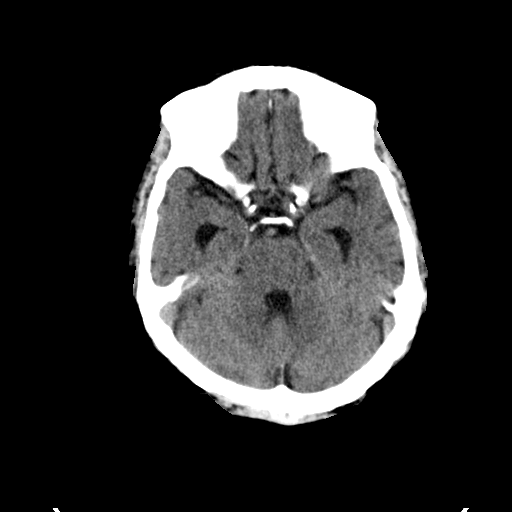
[im 10/30  brain]
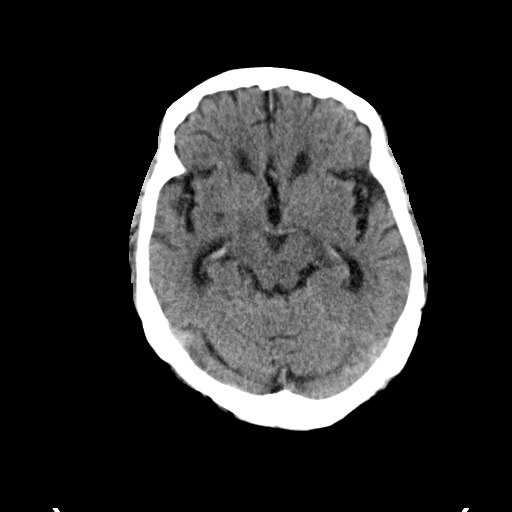
[im 10/30  bone]
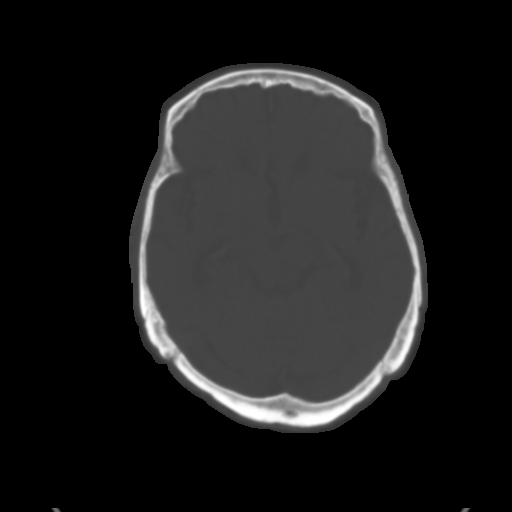
[im 12/30  brain]
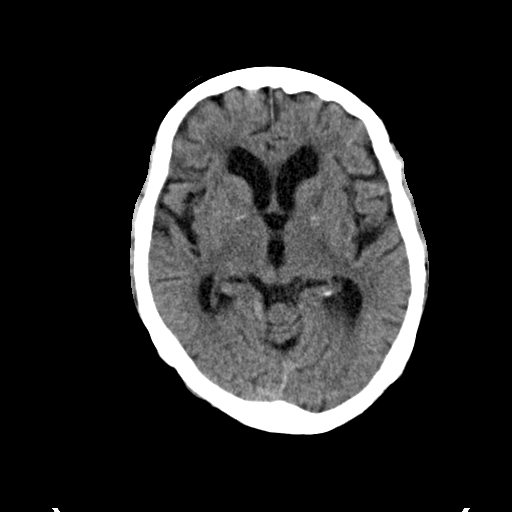
[im 14/30  brain]
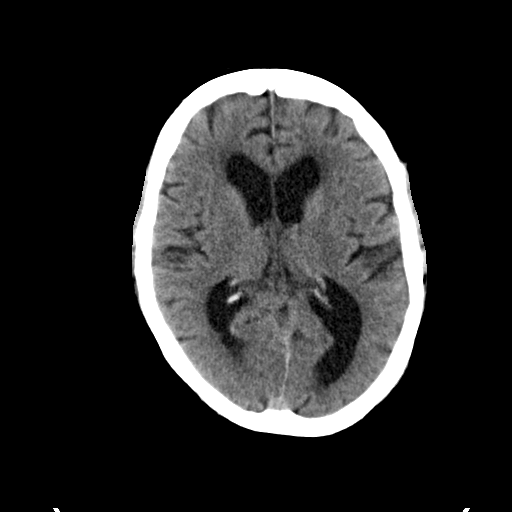
[im 16/30  brain]
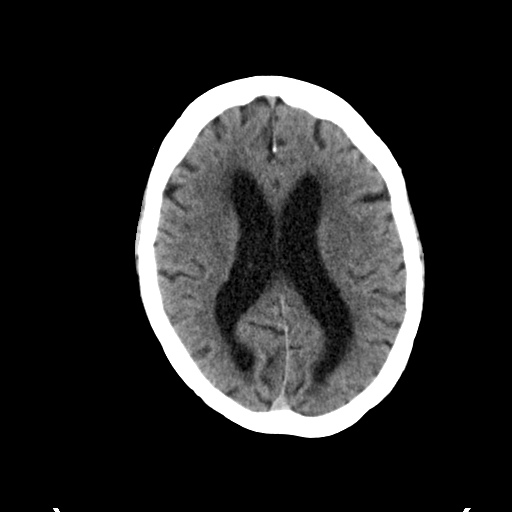
[im 17/30  brain]
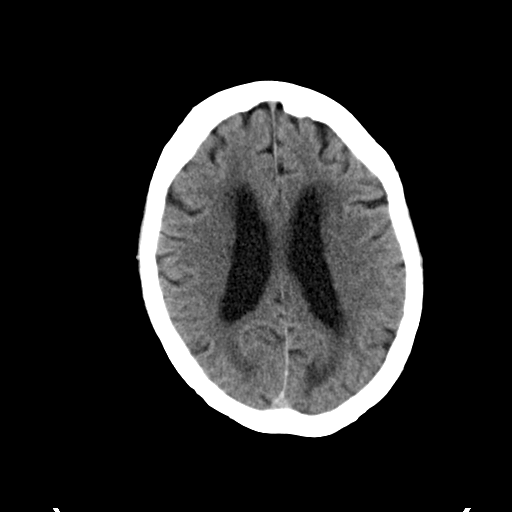
[im 17/30  bone]
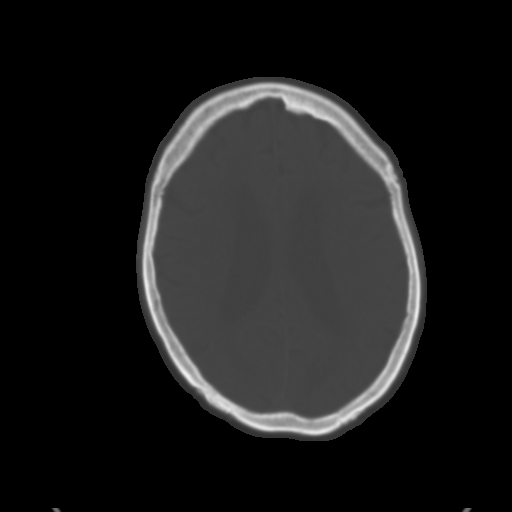
[im 19/30  brain]
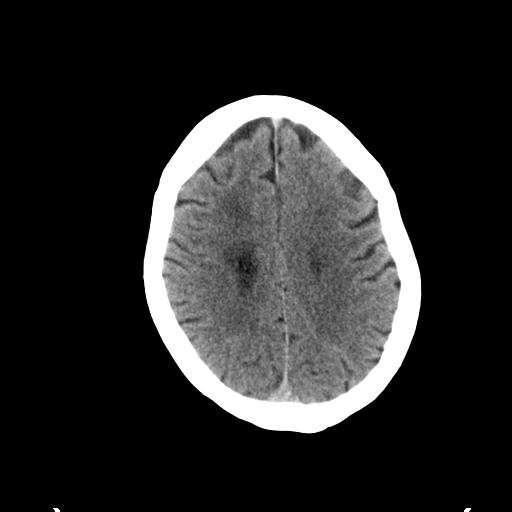
[im 21/30  brain]
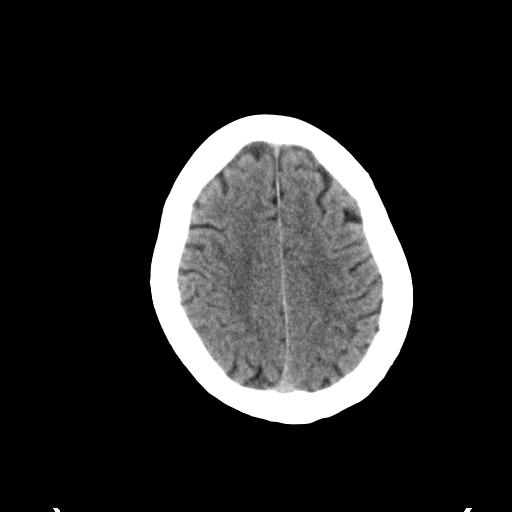
[im 23/30  brain]
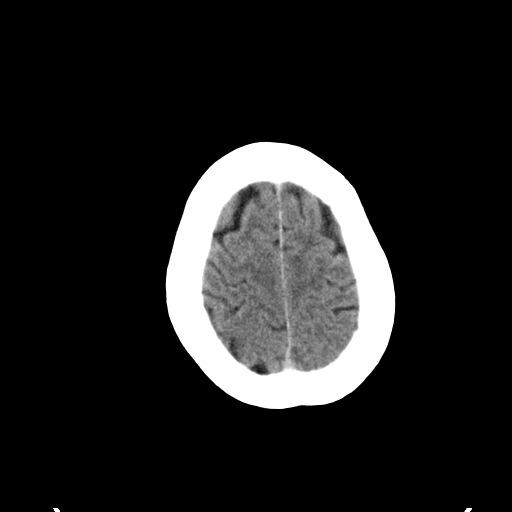
[im 25/30  brain]
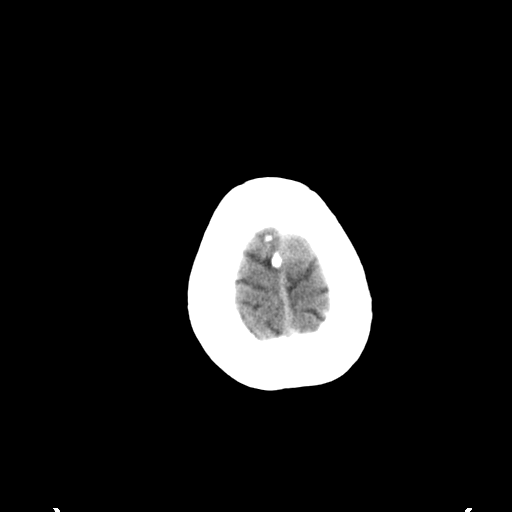
[im 25/30  bone]
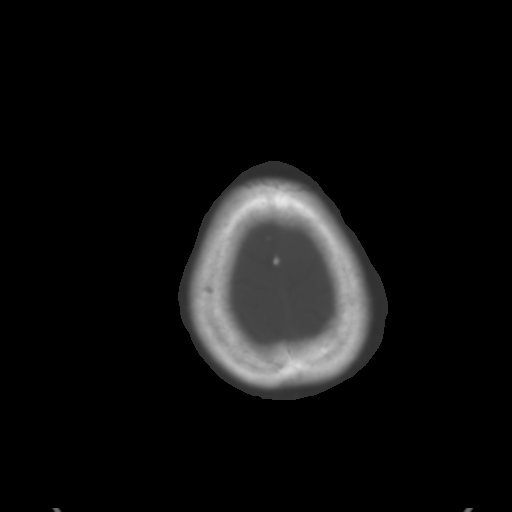
[im 27/30  brain]
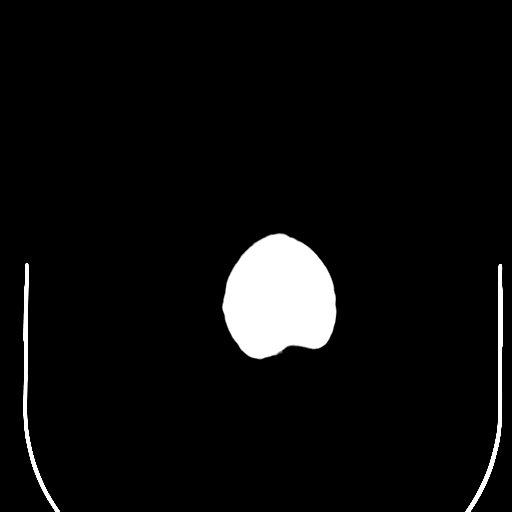
[im 29/30  brain]
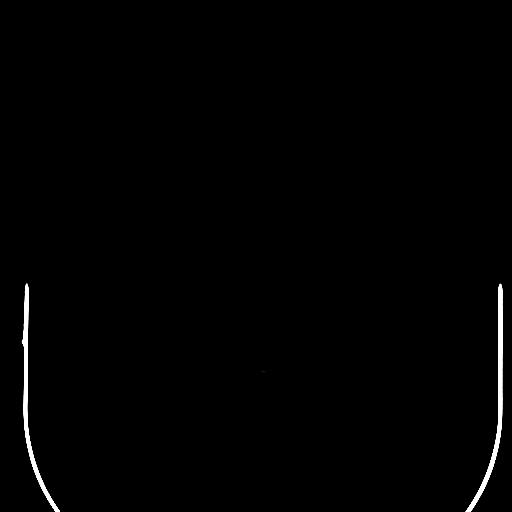

[15 of 30 positions shown; findings below may reference images not displayed]

FINDINGS: There is no evidence of acute infarction, mass lesion, or intra- or
extra-axial hemorrhage on CT.

Prominence of the ventricles and sulci reflects mild to moderate
cortical volume loss. Mild periventricular and subcortical white
matter change likely reflects small vessel ischemic microangiopathy.
Small chronic lacunar infarcts are seen at the basal ganglia
bilaterally.

The brainstem and fourth ventricle are within normal limits. The
basal ganglia are unremarkable in appearance. The cerebral
hemispheres demonstrate grossly normal gray-white differentiation.
No mass effect or midline shift is seen.

There is no evidence of fracture; visualized osseous structures are
unremarkable in appearance. The visualized portions of the orbits
are within normal limits. There is opacification of the left mastoid
air cells. The paranasal sinuses and right mastoid air cells are
well-aerated. No significant soft tissue abnormalities are seen.
IMPRESSION: 1. No acute intracranial pathology seen on CT.
2. Mild to moderate cortical volume loss and scattered small vessel
ischemic microangiopathy.
3. Small chronic lacunar infarcts at the basal ganglia bilaterally.
4. Opacification of the left mastoid air cells.

## 2017-10-28 DIAGNOSIS — F0280 Dementia in other diseases classified elsewhere without behavioral disturbance: Secondary | ICD-10-CM | POA: Diagnosis not present

## 2017-10-28 DIAGNOSIS — I10 Essential (primary) hypertension: Secondary | ICD-10-CM | POA: Diagnosis not present

## 2017-10-28 DIAGNOSIS — E1169 Type 2 diabetes mellitus with other specified complication: Secondary | ICD-10-CM | POA: Diagnosis not present

## 2017-10-28 DIAGNOSIS — E1159 Type 2 diabetes mellitus with other circulatory complications: Secondary | ICD-10-CM | POA: Diagnosis not present

## 2017-11-04 DIAGNOSIS — D649 Anemia, unspecified: Secondary | ICD-10-CM | POA: Diagnosis not present

## 2017-11-04 DIAGNOSIS — Z79899 Other long term (current) drug therapy: Secondary | ICD-10-CM | POA: Diagnosis not present

## 2017-12-09 ENCOUNTER — Ambulatory Visit: Payer: Medicare Other | Admitting: Family Medicine

## 2017-12-09 DIAGNOSIS — Z0289 Encounter for other administrative examinations: Secondary | ICD-10-CM

## 2017-12-14 ENCOUNTER — Encounter: Payer: Self-pay | Admitting: Family Medicine

## 2017-12-14 ENCOUNTER — Ambulatory Visit (INDEPENDENT_AMBULATORY_CARE_PROVIDER_SITE_OTHER): Payer: Medicare Other | Admitting: Family Medicine

## 2017-12-14 VITALS — BP 110/64 | HR 79 | Temp 98.8°F | Ht 62.0 in | Wt 164.4 lb

## 2017-12-14 DIAGNOSIS — E119 Type 2 diabetes mellitus without complications: Secondary | ICD-10-CM

## 2017-12-14 DIAGNOSIS — L602 Onychogryphosis: Secondary | ICD-10-CM

## 2017-12-14 DIAGNOSIS — Z23 Encounter for immunization: Secondary | ICD-10-CM | POA: Diagnosis not present

## 2017-12-14 NOTE — Progress Notes (Signed)
   Subjective:    Patient ID: Lisa Hahn, female    DOB: July 16, 1940, 77 y.o.   MRN: [id number]  HPI   Patient presents to clinic complaining of bilateral toe pain.  States pain is mainly in both big toes, but states all toes hurt.  Patient is a diabetic.  She denies any injury to foot or toes.  Son is with her in clinic, when asked he states no she has never been to podiatry for toenail trimming.  Patient Active Problem List   Diagnosis Date Noted  . UTI (urinary tract infection) 02/03/2017  . Syncope 01/27/2017  . Decreased pedal pulses 08/22/2016  . Hyperlipidemia 08/22/2016  . Poor dentition 04/16/2016  . Abdominal pain 04/16/2016  . Onychomycosis 04/16/2016  . Lightheadedness 08/15/2015  . Abdominal pain, chronic, epigastric 08/15/2015  . Dementia 07/25/2015  . Chest pain 06/28/2015  . Strain of left quadriceps tendon 06/28/2015  . Type 2 diabetes mellitus ([place]) 06/28/2015  . Vision changes 06/28/2015   Social History   Tobacco Use  . Smoking status: Former Research scientist (life sciences)  . Smokeless tobacco: Never Used  Substance Use Topics  . Alcohol use: No   Review of Systems Review of Systems  Constitutional: Negative for chills, fatigue and fever.  HENT: Negative for congestion, ear pain, sinus pain and sore throat.   Eyes: Negative.   Respiratory: Negative for cough, shortness of breath and wheezing.   Cardiovascular: Negative for chest pain, palpitations and leg swelling.  Gastrointestinal: Negative for abdominal pain, diarrhea, nausea and vomiting.  Genitourinary: Negative for dysuria, frequency and urgency.  Musculoskeletal: Negative for arthralgias and myalgias.  Skin: pain in all toes, toenails Neurological: Negative for syncope, light-headedness and headaches.  Psychiatric/Behavioral: The patient is not nervous/anxious.       Objective:   Physical Exam  Constitutional: She is oriented to person, place, and time. No distress.  HENT:  Head: Normocephalic and atraumatic.    Eyes: EOM are normal. No scleral icterus.  Cardiovascular: Normal rate and regular rhythm.  Pulmonary/Chest: Effort normal and breath sounds normal. No respiratory distress.  Neurological: She is alert and oriented to person, place, and time. No cranial nerve deficit.  Skin: Skin is warm and dry. No pallor.   Patient has thick diabetic socks and is wearing sneakers. Shoes and socks removed - Long thickened toenails on all toes.  Psychiatric: She has a normal mood and affect. Her behavior is normal.  Nursing note and vitals reviewed.  Vitals:   12/14/17 1459  BP: 110/64  Pulse: 79  Temp: 98.8 F (37.1 C)  SpO2: 95%      Assessment & Plan:   Long thick toenails -- Will do referral to podiatry for toenail trimming.   Type 2 DM -- Discussed good foot care due to being a diabetic & checking feet for any cuts/wounds.    High dose flu vaccine given in clinic today.  Follow up on chronic medical issues in 6 months. RTC sooner if issues arise.

## 2017-12-15 ENCOUNTER — Encounter: Payer: Self-pay | Admitting: Family Medicine

## 2018-01-04 ENCOUNTER — Ambulatory Visit: Payer: Medicare Other | Admitting: Podiatry

## 2018-02-09 DIAGNOSIS — E1159 Type 2 diabetes mellitus with other circulatory complications: Secondary | ICD-10-CM | POA: Diagnosis not present

## 2018-02-09 DIAGNOSIS — I10 Essential (primary) hypertension: Secondary | ICD-10-CM | POA: Diagnosis not present

## 2018-02-09 DIAGNOSIS — F0280 Dementia in other diseases classified elsewhere without behavioral disturbance: Secondary | ICD-10-CM | POA: Diagnosis not present

## 2018-02-09 DIAGNOSIS — E1169 Type 2 diabetes mellitus with other specified complication: Secondary | ICD-10-CM | POA: Diagnosis not present

## 2018-02-12 DIAGNOSIS — F0391 Unspecified dementia with behavioral disturbance: Secondary | ICD-10-CM | POA: Diagnosis not present

## 2018-02-12 DIAGNOSIS — G4701 Insomnia due to medical condition: Secondary | ICD-10-CM | POA: Diagnosis not present

## 2018-02-25 DIAGNOSIS — G4701 Insomnia due to medical condition: Secondary | ICD-10-CM | POA: Diagnosis not present

## 2018-02-25 DIAGNOSIS — F0391 Unspecified dementia with behavioral disturbance: Secondary | ICD-10-CM | POA: Diagnosis not present

## 2018-03-02 DIAGNOSIS — R569 Unspecified convulsions: Secondary | ICD-10-CM | POA: Diagnosis not present

## 2018-03-02 DIAGNOSIS — D649 Anemia, unspecified: Secondary | ICD-10-CM | POA: Diagnosis not present

## 2018-03-02 DIAGNOSIS — Z79899 Other long term (current) drug therapy: Secondary | ICD-10-CM | POA: Diagnosis not present

## 2018-03-03 DIAGNOSIS — D649 Anemia, unspecified: Secondary | ICD-10-CM | POA: Diagnosis not present

## 2018-03-03 DIAGNOSIS — E785 Hyperlipidemia, unspecified: Secondary | ICD-10-CM | POA: Diagnosis not present

## 2018-03-03 DIAGNOSIS — Z79899 Other long term (current) drug therapy: Secondary | ICD-10-CM | POA: Diagnosis not present

## 2018-03-06 DIAGNOSIS — G4701 Insomnia due to medical condition: Secondary | ICD-10-CM | POA: Diagnosis not present

## 2018-03-06 DIAGNOSIS — F0391 Unspecified dementia with behavioral disturbance: Secondary | ICD-10-CM | POA: Diagnosis not present

## 2018-03-11 DIAGNOSIS — R569 Unspecified convulsions: Secondary | ICD-10-CM | POA: Diagnosis not present

## 2018-03-11 DIAGNOSIS — Z79899 Other long term (current) drug therapy: Secondary | ICD-10-CM | POA: Diagnosis not present

## 2018-03-17 DIAGNOSIS — B351 Tinea unguium: Secondary | ICD-10-CM | POA: Diagnosis not present

## 2018-03-17 DIAGNOSIS — I739 Peripheral vascular disease, unspecified: Secondary | ICD-10-CM | POA: Diagnosis not present

## 2018-03-29 ENCOUNTER — Ambulatory Visit: Payer: Self-pay | Admitting: *Deleted

## 2018-03-29 NOTE — Telephone Encounter (Signed)
Notified patient son

## 2018-03-29 NOTE — Telephone Encounter (Signed)
Patient is having abdominal pain- patient had blockage in the past and had surgery for that (years ago). Patient has been having normal and regular bowel movements.She has told him she is getting old- he is concerned and he is worried about her. Patient lives at [facility].  Son would like to make his mother an appointment because she seems to have discomfort at times- he can see in her face.  Unable to triage patient- son is calling with concerns about mother- she is in home and has dementia.

## 2018-03-29 NOTE — Telephone Encounter (Signed)
Noted appt 04/06/18  If her pain worsens/becomes severe, develops vomiting, diarrhea that persists, fever she should go to ER for evaluation

## 2018-04-01 DIAGNOSIS — F0391 Unspecified dementia with behavioral disturbance: Secondary | ICD-10-CM | POA: Diagnosis not present

## 2018-04-01 DIAGNOSIS — G4701 Insomnia due to medical condition: Secondary | ICD-10-CM | POA: Diagnosis not present

## 2018-04-06 ENCOUNTER — Ambulatory Visit: Payer: Medicare Other | Admitting: Family Medicine

## 2018-04-06 NOTE — Progress Notes (Deleted)
   Subjective:    Patient ID: Lisa Hahn, female    DOB: Feb 09, 1941, 77 y.o.   MRN: [id number]  HPI  Presents to clinic c/o ABD pain off and on  BMs -   Eating -  Urine -  Patient Active Problem List   Diagnosis Date Noted  . UTI (urinary tract infection) 02/03/2017  . Syncope 01/27/2017  . Decreased pedal pulses 08/22/2016  . Hyperlipidemia 08/22/2016  . Poor dentition 04/16/2016  . Abdominal pain 04/16/2016  . Onychomycosis 04/16/2016  . Lightheadedness 08/15/2015  . Abdominal pain, chronic, epigastric 08/15/2015  . Dementia ([place]) 07/25/2015  . Chest pain 06/28/2015  . Strain of left quadriceps tendon 06/28/2015  . Type 2 diabetes mellitus ([place]) 06/28/2015  . Vision changes 06/28/2015   Social History   Tobacco Use  . Smoking status: Former Research scientist (life sciences)  . Smokeless tobacco: Never Used  Substance Use Topics  . Alcohol use: No   Review of Systems  Constitutional: Negative for chills, fatigue and fever.  HENT: Negative for congestion, ear pain, sinus pain and sore throat.   Eyes: Negative.   Respiratory: Negative for cough, shortness of breath and wheezing.   Cardiovascular: Negative for chest pain, palpitations and leg swelling.  Gastrointestinal: Negative for abdominal pain, diarrhea, nausea and vomiting.  Genitourinary: Negative for dysuria, frequency and urgency.  Musculoskeletal: Negative for arthralgias and myalgias.  Skin: Negative for color change, pallor and rash.  Neurological: Negative for syncope, light-headedness and headaches.  Psychiatric/Behavioral: The patient is not nervous/anxious.       Objective:   Physical Exam        Assessment & Plan:

## 2018-04-09 ENCOUNTER — Ambulatory Visit: Payer: Medicare Other | Admitting: Family Medicine

## 2018-04-09 NOTE — Progress Notes (Deleted)
   Subjective:    Patient ID: Lisa Hahn, female    DOB: 07-29-1940, 78 y.o.   MRN: [id number]  HPI  Presents to clinic c/o ABD discomfort off and on  Patient Active Problem List   Diagnosis Date Noted  . UTI (urinary tract infection) 02/03/2017  . Syncope 01/27/2017  . Decreased pedal pulses 08/22/2016  . Hyperlipidemia 08/22/2016  . Poor dentition 04/16/2016  . Abdominal pain 04/16/2016  . Onychomycosis 04/16/2016  . Lightheadedness 08/15/2015  . Abdominal pain, chronic, epigastric 08/15/2015  . Dementia ([place]) 07/25/2015  . Chest pain 06/28/2015  . Strain of left quadriceps tendon 06/28/2015  . Type 2 diabetes mellitus ([place]) 06/28/2015  . Vision changes 06/28/2015   Social History   Tobacco Use  . Smoking status: Former Research scientist (life sciences)  . Smokeless tobacco: Never Used  Substance Use Topics  . Alcohol use: No   Review of Systems   Constitutional: Negative for chills, fatigue and fever.  HENT: Negative for congestion, ear pain, sinus pain and sore throat.   Eyes: Negative.   Respiratory: Negative for cough, shortness of breath and wheezing.   Cardiovascular: Negative for chest pain, palpitations and leg swelling.  Gastrointestinal: Negative for abdominal pain, diarrhea, nausea and vomiting.  Genitourinary: Negative for dysuria, frequency and urgency.  Musculoskeletal: Negative for arthralgias and myalgias.  Skin: Negative for color change, pallor and rash.  Neurological: Negative for syncope, light-headedness and headaches.  Psychiatric/Behavioral: The patient is not nervous/anxious.       Objective:   Physical Exam        Assessment & Plan:

## 2018-04-16 DIAGNOSIS — G4701 Insomnia due to medical condition: Secondary | ICD-10-CM | POA: Diagnosis not present

## 2018-04-16 DIAGNOSIS — F0391 Unspecified dementia with behavioral disturbance: Secondary | ICD-10-CM | POA: Diagnosis not present

## 2018-04-30 DIAGNOSIS — I10 Essential (primary) hypertension: Secondary | ICD-10-CM | POA: Diagnosis not present

## 2018-04-30 DIAGNOSIS — F0280 Dementia in other diseases classified elsewhere without behavioral disturbance: Secondary | ICD-10-CM | POA: Diagnosis not present

## 2018-04-30 DIAGNOSIS — E1169 Type 2 diabetes mellitus with other specified complication: Secondary | ICD-10-CM | POA: Diagnosis not present

## 2018-04-30 DIAGNOSIS — E1159 Type 2 diabetes mellitus with other circulatory complications: Secondary | ICD-10-CM | POA: Diagnosis not present

## 2018-05-01 DIAGNOSIS — E119 Type 2 diabetes mellitus without complications: Secondary | ICD-10-CM | POA: Diagnosis not present

## 2018-05-01 DIAGNOSIS — Z79899 Other long term (current) drug therapy: Secondary | ICD-10-CM | POA: Diagnosis not present

## 2018-05-13 DIAGNOSIS — F0151 Vascular dementia with behavioral disturbance: Secondary | ICD-10-CM | POA: Diagnosis not present

## 2018-05-13 DIAGNOSIS — G4701 Insomnia due to medical condition: Secondary | ICD-10-CM | POA: Diagnosis not present

## 2018-05-27 DIAGNOSIS — F0151 Vascular dementia with behavioral disturbance: Secondary | ICD-10-CM | POA: Diagnosis not present

## 2018-05-27 DIAGNOSIS — G4701 Insomnia due to medical condition: Secondary | ICD-10-CM | POA: Diagnosis not present

## 2018-06-05 DIAGNOSIS — F0151 Vascular dementia with behavioral disturbance: Secondary | ICD-10-CM | POA: Diagnosis not present

## 2018-06-11 DIAGNOSIS — D649 Anemia, unspecified: Secondary | ICD-10-CM | POA: Diagnosis not present

## 2018-06-11 DIAGNOSIS — I10 Essential (primary) hypertension: Secondary | ICD-10-CM | POA: Diagnosis not present

## 2018-06-18 DIAGNOSIS — F0281 Dementia in other diseases classified elsewhere with behavioral disturbance: Secondary | ICD-10-CM | POA: Diagnosis not present

## 2018-06-18 DIAGNOSIS — R569 Unspecified convulsions: Secondary | ICD-10-CM | POA: Diagnosis not present

## 2018-06-24 DIAGNOSIS — F0151 Vascular dementia with behavioral disturbance: Secondary | ICD-10-CM | POA: Diagnosis not present

## 2018-06-24 DIAGNOSIS — G4701 Insomnia due to medical condition: Secondary | ICD-10-CM | POA: Diagnosis not present

## 2018-07-08 DIAGNOSIS — G4701 Insomnia due to medical condition: Secondary | ICD-10-CM | POA: Diagnosis not present

## 2018-07-08 DIAGNOSIS — F0151 Vascular dementia with behavioral disturbance: Secondary | ICD-10-CM | POA: Diagnosis not present

## 2018-07-09 DIAGNOSIS — F0281 Dementia in other diseases classified elsewhere with behavioral disturbance: Secondary | ICD-10-CM | POA: Diagnosis not present

## 2018-07-09 DIAGNOSIS — R569 Unspecified convulsions: Secondary | ICD-10-CM | POA: Diagnosis not present

## 2018-07-12 DIAGNOSIS — E1159 Type 2 diabetes mellitus with other circulatory complications: Secondary | ICD-10-CM | POA: Diagnosis not present

## 2018-07-12 DIAGNOSIS — I10 Essential (primary) hypertension: Secondary | ICD-10-CM | POA: Diagnosis not present

## 2018-07-12 DIAGNOSIS — F0280 Dementia in other diseases classified elsewhere without behavioral disturbance: Secondary | ICD-10-CM | POA: Diagnosis not present

## 2018-07-13 DIAGNOSIS — F0151 Vascular dementia with behavioral disturbance: Secondary | ICD-10-CM | POA: Diagnosis not present

## 2018-07-13 DIAGNOSIS — G4701 Insomnia due to medical condition: Secondary | ICD-10-CM | POA: Diagnosis not present

## 2018-07-15 DIAGNOSIS — Z0000 Encounter for general adult medical examination without abnormal findings: Secondary | ICD-10-CM | POA: Diagnosis not present

## 2018-07-15 DIAGNOSIS — Z1331 Encounter for screening for depression: Secondary | ICD-10-CM | POA: Diagnosis not present

## 2018-07-15 DIAGNOSIS — Z9181 History of falling: Secondary | ICD-10-CM | POA: Diagnosis not present

## 2018-08-05 DIAGNOSIS — G4701 Insomnia due to medical condition: Secondary | ICD-10-CM | POA: Diagnosis not present

## 2018-08-05 DIAGNOSIS — F0151 Vascular dementia with behavioral disturbance: Secondary | ICD-10-CM | POA: Diagnosis not present

## 2018-08-13 DIAGNOSIS — E1169 Type 2 diabetes mellitus with other specified complication: Secondary | ICD-10-CM | POA: Diagnosis not present

## 2018-08-13 DIAGNOSIS — F0280 Dementia in other diseases classified elsewhere without behavioral disturbance: Secondary | ICD-10-CM | POA: Diagnosis not present

## 2018-08-13 DIAGNOSIS — I10 Essential (primary) hypertension: Secondary | ICD-10-CM | POA: Diagnosis not present

## 2018-08-13 DIAGNOSIS — E1159 Type 2 diabetes mellitus with other circulatory complications: Secondary | ICD-10-CM | POA: Diagnosis not present

## 2018-08-18 DIAGNOSIS — L603 Nail dystrophy: Secondary | ICD-10-CM | POA: Diagnosis not present

## 2018-08-18 DIAGNOSIS — I739 Peripheral vascular disease, unspecified: Secondary | ICD-10-CM | POA: Diagnosis not present

## 2018-08-18 DIAGNOSIS — B351 Tinea unguium: Secondary | ICD-10-CM | POA: Diagnosis not present

## 2018-08-20 DIAGNOSIS — H2513 Age-related nuclear cataract, bilateral: Secondary | ICD-10-CM | POA: Diagnosis not present

## 2018-08-20 DIAGNOSIS — H04123 Dry eye syndrome of bilateral lacrimal glands: Secondary | ICD-10-CM | POA: Diagnosis not present

## 2018-08-20 DIAGNOSIS — H43813 Vitreous degeneration, bilateral: Secondary | ICD-10-CM | POA: Diagnosis not present

## 2018-08-20 DIAGNOSIS — G309 Alzheimer's disease, unspecified: Secondary | ICD-10-CM | POA: Diagnosis not present

## 2018-09-02 DIAGNOSIS — F0151 Vascular dementia with behavioral disturbance: Secondary | ICD-10-CM | POA: Diagnosis not present

## 2018-09-02 DIAGNOSIS — G4701 Insomnia due to medical condition: Secondary | ICD-10-CM | POA: Diagnosis not present

## 2018-09-13 DIAGNOSIS — I10 Essential (primary) hypertension: Secondary | ICD-10-CM | POA: Diagnosis not present

## 2018-09-13 DIAGNOSIS — E1159 Type 2 diabetes mellitus with other circulatory complications: Secondary | ICD-10-CM | POA: Diagnosis not present

## 2018-09-13 DIAGNOSIS — E785 Hyperlipidemia, unspecified: Secondary | ICD-10-CM | POA: Diagnosis not present

## 2018-09-13 DIAGNOSIS — E1169 Type 2 diabetes mellitus with other specified complication: Secondary | ICD-10-CM | POA: Diagnosis not present

## 2018-09-30 DIAGNOSIS — G4701 Insomnia due to medical condition: Secondary | ICD-10-CM | POA: Diagnosis not present

## 2018-09-30 DIAGNOSIS — F0151 Vascular dementia with behavioral disturbance: Secondary | ICD-10-CM | POA: Diagnosis not present

## 2018-10-14 DIAGNOSIS — E1159 Type 2 diabetes mellitus with other circulatory complications: Secondary | ICD-10-CM | POA: Diagnosis not present

## 2018-10-14 DIAGNOSIS — E1169 Type 2 diabetes mellitus with other specified complication: Secondary | ICD-10-CM | POA: Diagnosis not present

## 2018-10-14 DIAGNOSIS — G4701 Insomnia due to medical condition: Secondary | ICD-10-CM | POA: Diagnosis not present

## 2018-10-14 DIAGNOSIS — F0280 Dementia in other diseases classified elsewhere without behavioral disturbance: Secondary | ICD-10-CM | POA: Diagnosis not present

## 2018-10-14 DIAGNOSIS — F0151 Vascular dementia with behavioral disturbance: Secondary | ICD-10-CM | POA: Diagnosis not present

## 2018-10-14 DIAGNOSIS — I10 Essential (primary) hypertension: Secondary | ICD-10-CM | POA: Diagnosis not present

## 2018-10-22 ENCOUNTER — Other Ambulatory Visit: Payer: Self-pay

## 2018-10-22 ENCOUNTER — Emergency Department: Payer: Medicare Other

## 2018-10-22 ENCOUNTER — Emergency Department
Admission: EM | Admit: 2018-10-22 | Discharge: 2018-10-22 | Disposition: A | Discharge status: Home / Self Care | Payer: Medicare Other | Attending: Emergency Medicine | Admitting: Emergency Medicine

## 2018-10-22 DIAGNOSIS — E119 Type 2 diabetes mellitus without complications: Secondary | ICD-10-CM | POA: Insufficient documentation

## 2018-10-22 DIAGNOSIS — Z87891 Personal history of nicotine dependence: Secondary | ICD-10-CM | POA: Diagnosis not present

## 2018-10-22 DIAGNOSIS — Z7982 Long term (current) use of aspirin: Secondary | ICD-10-CM | POA: Insufficient documentation

## 2018-10-22 DIAGNOSIS — S199XXA Unspecified injury of neck, initial encounter: Secondary | ICD-10-CM | POA: Diagnosis not present

## 2018-10-22 DIAGNOSIS — R5381 Other malaise: Secondary | ICD-10-CM | POA: Diagnosis not present

## 2018-10-22 DIAGNOSIS — W010XXA Fall on same level from slipping, tripping and stumbling without subsequent striking against object, initial encounter: Secondary | ICD-10-CM | POA: Insufficient documentation

## 2018-10-22 DIAGNOSIS — S0990XA Unspecified injury of head, initial encounter: Secondary | ICD-10-CM | POA: Diagnosis not present

## 2018-10-22 DIAGNOSIS — R52 Pain, unspecified: Secondary | ICD-10-CM | POA: Diagnosis not present

## 2018-10-22 DIAGNOSIS — Z7984 Long term (current) use of oral hypoglycemic drugs: Secondary | ICD-10-CM | POA: Diagnosis not present

## 2018-10-22 DIAGNOSIS — W19XXXA Unspecified fall, initial encounter: Secondary | ICD-10-CM

## 2018-10-22 DIAGNOSIS — Z8673 Personal history of transient ischemic attack (TIA), and cerebral infarction without residual deficits: Secondary | ICD-10-CM | POA: Insufficient documentation

## 2018-10-22 DIAGNOSIS — S0083XA Contusion of other part of head, initial encounter: Secondary | ICD-10-CM | POA: Diagnosis not present

## 2018-10-22 DIAGNOSIS — I10 Essential (primary) hypertension: Secondary | ICD-10-CM | POA: Insufficient documentation

## 2018-10-22 DIAGNOSIS — Z79899 Other long term (current) drug therapy: Secondary | ICD-10-CM | POA: Insufficient documentation

## 2018-10-22 DIAGNOSIS — Y939 Activity, unspecified: Secondary | ICD-10-CM | POA: Diagnosis not present

## 2018-10-22 DIAGNOSIS — Y92129 Unspecified place in nursing home as the place of occurrence of the external cause: Secondary | ICD-10-CM | POA: Diagnosis not present

## 2018-10-22 DIAGNOSIS — Y999 Unspecified external cause status: Secondary | ICD-10-CM | POA: Insufficient documentation

## 2018-10-22 DIAGNOSIS — S01511A Laceration without foreign body of lip, initial encounter: Secondary | ICD-10-CM

## 2018-10-22 NOTE — Discharge Instructions (Signed)
You have been seen in the Emergency Department (ED) today for a fall.  Your work up does not show any concerning injuries.  Please take over-the-counter ibuprofen and/or Tylenol as needed for your pain (unless you have an allergy or your doctor as told you not to take them), or take any prescribed medication as instructed.  We encourage you to stick with a soft food diet, at least for the next week.  After discussing the plan with you and your son, we decided to repair your lip laceration with skin adhesive rather than sutures.  It may open back up but it will heal from the inside out.  Please try to stick with a soft food diet and follow-up with your doctor within the next few days for a wound check.  The skin adhesive will come off with time.  Please follow up with your doctor regarding today's Emergency Department (ED) visit and your recent fall.    Return to the ED if you have any headache, confusion, slurred speech, weakness/numbness of any arm or leg, or any increased pain.

## 2018-10-22 NOTE — ED Notes (Signed)
[facility] called to notify that patient will be returning. She should be on a soft diet and use Tylenol/ibuprofen for pain. Son will transport patient back to facility, OK per facility. Patient wheeled to son's POV, NAD

## 2018-10-22 NOTE — ED Provider Notes (Signed)
[facility] Emergency Department Provider Note  ____________________________________________   First MD Initiated Contact with Patient 10/22/18 0133     (approximate)  I have reviewed the triage vital signs and the nursing notes.   HISTORY  Chief Complaint Fall  Level 5 caveat:  history/ROS limited by chronic dementia  HPI Lisa Hahn is a 78 y.o. female with chronic dementia who presents after a witnessed fall at her nursing facility.  Apparently she fell forward onto her face and has a laceration on the right lower lip.  She is denying any pain although she said that her lip feels funny.  Apparently she did not lose consciousness and she denies any pain in her head and neck.  She denies chest pain, shortness of breath, cough, nausea, vomiting, and abdominal pain.  Bleeding is well controlled at this time.  Onset was acute and the injury is moderate.         Past Medical History:  Diagnosis Date   Arthritis    Diabetes mellitus without complication ([place])    Hypertension    Stroke [facility])     Patient Active Problem List   Diagnosis Date Noted   UTI (urinary tract infection) 02/03/2017   Syncope 01/27/2017   Decreased pedal pulses 08/22/2016   Hyperlipidemia 08/22/2016   Poor dentition 04/16/2016   Abdominal pain 04/16/2016   Onychomycosis 04/16/2016   Lightheadedness 08/15/2015   Abdominal pain, chronic, epigastric 08/15/2015   Dementia ([place]) 07/25/2015   Chest pain 06/28/2015   Strain of left quadriceps tendon 06/28/2015   Type 2 diabetes mellitus ([place]) 06/28/2015   Vision changes 06/28/2015    Past Surgical History:  Procedure Laterality Date   ABDOMINAL HYSTERECTOMY     ABDOMINAL SURGERY     blockage     Prior to Admission medications   Medication Sig Start Date End Date Taking? Authorizing Provider  acetaminophen (TYLENOL) 325 MG tablet Take 2 tablets (650 mg total) by mouth every 6 (six) hours as  needed. Patient taking differently: Take 650 mg by mouth every 6 (six) hours as needed for mild pain.  11/23/16   Carrie Mew, MD  aspirin EC 81 MG tablet Take 1 tablet (81 mg total) by mouth daily. 01/28/17   Dustin Flock, MD  atorvastatin (LIPITOR) 40 MG tablet Take 40 mg by mouth daily.    [provider]  blood glucose meter kit and supplies KIT Dispense based on patient and insurance preference. Test 3 times daily. ICD 10 code E11.9. 01/31/17   Leone Haven, MD  cephALEXin (KEFLEX) 500 MG capsule Take 1 capsule (500 mg total) by mouth 2 (two) times daily. 05/18/17   Hinda Kehr, MD  cholecalciferol (VITAMIN D) 1000 units tablet Take 1 tablet (1,000 Units total) by mouth daily. 01/28/17   Dustin Flock, MD  JANUVIA 100 MG tablet Take 100 mg by mouth daily. 12/04/16   [provider]  metFORMIN (GLUCOPHAGE) 1000 MG tablet Take 1 tablet (1,000 mg total) by mouth 2 (two) times daily with a meal. 01/28/17   Dustin Flock, MD  [facility] ULTRA TEST test strip USE TO TEST BLOOD SUGAR 3 TIMES DAILY 03/10/17   Leone Haven, MD  [facility] DELICA LANCETS FINE MISC USE TO TEST BLOOD SUGAR 3 TIMES DAILY 03/10/17   Leone Haven, MD  potassium chloride SA (KLOR-CON M20) 20 MEQ tablet Take 1 tablet (20 mEq total) by mouth daily. 05/18/17   Hinda Kehr, MD  traMADol (ULTRAM) 50 MG  tablet Take 50 mg by mouth every 6 (six) hours as needed.    [provider]    Allergies Aspirin and Motrin [ibuprofen]  Family History  Problem Relation Age of Onset   Arthritis Other        Parent    Social History Social History   Tobacco Use   Smoking status: Former Smoker   Smokeless tobacco: Never Used  Substance Use Topics   Alcohol use: No   Drug use: No    Review of Systems Level 5 caveat:  history/ROS limited by chronic dementia.  Witnessed fall with laceration to right lower lip.  ____________________________________________   PHYSICAL  EXAM:  VITAL SIGNS: ED Triage Vitals  Enc Vitals Group     BP 10/22/18 0055 (!) 128/91     Pulse Rate 10/22/18 0055 93     Resp 10/22/18 0055 14     Temp 10/22/18 0055 98.2 F (36.8 C)     Temp Source 10/22/18 0055 Oral     SpO2 10/22/18 0055 99 %     Weight 10/22/18 0054 65.8 kg (145 lb)     Height 10/22/18 0054 1.651 m ('5\' 5"'$ )     Head Circumference --      Peak Flow --      Pain Score 10/22/18 0053 10     Pain Loc --      Pain Edu? --      Excl. in [place]? --     Constitutional: Alert and oriented to herself, knows her son who is at bedside.  No acute distress. Eyes: Conjunctivae are normal.  Head: Atraumatic except for the lower lip. Nose: There is a small amount of dried blood at the bottom of her nostrils but is a little bit difficult to tell if this is coming from the nose or from her lip wound.  No tenderness to palpation of the nose and no active bleeding. Mouth/Throat: The patient has an approximate 1 cm laceration to the mucosa of her right lower lip.  It is not down to the muscle although it is gaping slightly. Neck: No stridor.  No meningeal signs.  No cervical spine tenderness to palpation. Cardiovascular: Normal rate, regular rhythm. Good peripheral circulation. Grossly normal heart sounds. Respiratory: Normal respiratory effort.  No retractions. No audible wheezing. Gastrointestinal: Soft and nontender. No distention.  Musculoskeletal: No lower extremity tenderness nor edema. No gross deformities of extremities. Neurologic:  Normal speech and language. No gross focal neurologic deficits are appreciated.  Skin:  Skin is warm, dry and intact except for the lip laceration. No rash noted.   ____________________________________________   LABS (all labs ordered are listed, but only abnormal results are displayed)  Labs Reviewed - No data to display ____________________________________________  EKG  No indication for  EKG ____________________________________________  RADIOLOGY   ED MD interpretation: Notification of acute traumatic injury  Official radiology report(s): Ct Head Wo Contrast  Result Date: 10/22/2018 CLINICAL DATA:  Mechanical fall with laceration to the lower lip EXAM: CT HEAD WITHOUT CONTRAST CT MAXILLOFACIAL WITHOUT CONTRAST CT CERVICAL SPINE WITHOUT CONTRAST TECHNIQUE: Multidetector CT imaging of the head, cervical spine, and maxillofacial structures were performed using the standard protocol without intravenous contrast. Multiplanar CT image reconstructions of the cervical spine and maxillofacial structures were also generated. COMPARISON:  CT 01/27/2017, 10/05/2008 CT brain 07/21/2015 FINDINGS: CT HEAD FINDINGS Brain: No acute territorial infarction, hemorrhage, or intracranial mass is visualized. Atrophy with moderate small vessel ischemic changes of the white matter. Stable  ventricle size. Chronic lacunar infarct right basal ganglia. Vascular: No hyperdense vessel. Vertebral and carotid vascular calcification. Skull: Normal. Negative for fracture or focal lesion. Other: None CT MAXILLOFACIAL FINDINGS Osseous: Fluid in the left mastoid. Mandibular heads are normally position. No mandibular fracture. Pterygoid plates and zygomatic arches are intact. No acute nasal bone fracture Orbits: Negative. No traumatic or inflammatory finding. Sinuses: No acute fluid levels. Mild mucosal thickening in the sinuses. No sinus wall fracture. Soft tissues: Left lower lip laceration. Mild nasal soft tissue swelling. CT CERVICAL SPINE FINDINGS Alignment: Straightening of the cervical spine. No subluxation. Facet alignment within normal limits. Skull base and vertebrae: No acute fracture. No primary bone lesion or focal pathologic process. Soft tissues and spinal canal: No prevertebral fluid or swelling. No visible canal hematoma. Disc levels: Mild diffuse degenerative changes with moderate to marked degenerative  change at C6-C7 and C7-T1. Bulky anterior osteophyte at C5-C6 and C6-C7. Upper chest: Negative. Other: None IMPRESSION: 1. No CT evidence for acute intracranial abnormality. Atrophy and small vessel ischemic changes of the white matter 2. No acute facial bone fracture 3. Straightening of the cervical spine with degenerative change. No acute osseous abnormality. Electronically Signed   By: Donavan Foil M.D.   On: 10/22/2018 02:51   Ct Cervical Spine Wo Contrast  Result Date: 10/22/2018 CLINICAL DATA:  Mechanical fall with laceration to the lower lip EXAM: CT HEAD WITHOUT CONTRAST CT MAXILLOFACIAL WITHOUT CONTRAST CT CERVICAL SPINE WITHOUT CONTRAST TECHNIQUE: Multidetector CT imaging of the head, cervical spine, and maxillofacial structures were performed using the standard protocol without intravenous contrast. Multiplanar CT image reconstructions of the cervical spine and maxillofacial structures were also generated. COMPARISON:  CT 01/27/2017, 10/05/2008 CT brain 07/21/2015 FINDINGS: CT HEAD FINDINGS Brain: No acute territorial infarction, hemorrhage, or intracranial mass is visualized. Atrophy with moderate small vessel ischemic changes of the white matter. Stable ventricle size. Chronic lacunar infarct right basal ganglia. Vascular: No hyperdense vessel. Vertebral and carotid vascular calcification. Skull: Normal. Negative for fracture or focal lesion. Other: None CT MAXILLOFACIAL FINDINGS Osseous: Fluid in the left mastoid. Mandibular heads are normally position. No mandibular fracture. Pterygoid plates and zygomatic arches are intact. No acute nasal bone fracture Orbits: Negative. No traumatic or inflammatory finding. Sinuses: No acute fluid levels. Mild mucosal thickening in the sinuses. No sinus wall fracture. Soft tissues: Left lower lip laceration. Mild nasal soft tissue swelling. CT CERVICAL SPINE FINDINGS Alignment: Straightening of the cervical spine. No subluxation. Facet alignment within normal  limits. Skull base and vertebrae: No acute fracture. No primary bone lesion or focal pathologic process. Soft tissues and spinal canal: No prevertebral fluid or swelling. No visible canal hematoma. Disc levels: Mild diffuse degenerative changes with moderate to marked degenerative change at C6-C7 and C7-T1. Bulky anterior osteophyte at C5-C6 and C6-C7. Upper chest: Negative. Other: None IMPRESSION: 1. No CT evidence for acute intracranial abnormality. Atrophy and small vessel ischemic changes of the white matter 2. No acute facial bone fracture 3. Straightening of the cervical spine with degenerative change. No acute osseous abnormality. Electronically Signed   By: Donavan Foil M.D.   On: 10/22/2018 02:51   Ct Maxillofacial Wo Contrast  Result Date: 10/22/2018 CLINICAL DATA:  Mechanical fall with laceration to the lower lip EXAM: CT HEAD WITHOUT CONTRAST CT MAXILLOFACIAL WITHOUT CONTRAST CT CERVICAL SPINE WITHOUT CONTRAST TECHNIQUE: Multidetector CT imaging of the head, cervical spine, and maxillofacial structures were performed using the standard protocol without intravenous contrast. Multiplanar CT image reconstructions of the  cervical spine and maxillofacial structures were also generated. COMPARISON:  CT 01/27/2017, 10/05/2008 CT brain 07/21/2015 FINDINGS: CT HEAD FINDINGS Brain: No acute territorial infarction, hemorrhage, or intracranial mass is visualized. Atrophy with moderate small vessel ischemic changes of the white matter. Stable ventricle size. Chronic lacunar infarct right basal ganglia. Vascular: No hyperdense vessel. Vertebral and carotid vascular calcification. Skull: Normal. Negative for fracture or focal lesion. Other: None CT MAXILLOFACIAL FINDINGS Osseous: Fluid in the left mastoid. Mandibular heads are normally position. No mandibular fracture. Pterygoid plates and zygomatic arches are intact. No acute nasal bone fracture Orbits: Negative. No traumatic or inflammatory finding. Sinuses: No  acute fluid levels. Mild mucosal thickening in the sinuses. No sinus wall fracture. Soft tissues: Left lower lip laceration. Mild nasal soft tissue swelling. CT CERVICAL SPINE FINDINGS Alignment: Straightening of the cervical spine. No subluxation. Facet alignment within normal limits. Skull base and vertebrae: No acute fracture. No primary bone lesion or focal pathologic process. Soft tissues and spinal canal: No prevertebral fluid or swelling. No visible canal hematoma. Disc levels: Mild diffuse degenerative changes with moderate to marked degenerative change at C6-C7 and C7-T1. Bulky anterior osteophyte at C5-C6 and C6-C7. Upper chest: Negative. Other: None IMPRESSION: 1. No CT evidence for acute intracranial abnormality. Atrophy and small vessel ischemic changes of the white matter 2. No acute facial bone fracture 3. Straightening of the cervical spine with degenerative change. No acute osseous abnormality. Electronically Signed   By: Donavan Foil M.D.   On: 10/22/2018 02:51    ____________________________________________   PROCEDURES   Procedure(s) performed (including Critical Care):  Marland KitchenMarland KitchenLaceration Repair  Date/Time: 10/22/2018 4:08 AM Performed by: Hinda Kehr, MD Authorized by: Hinda Kehr, MD   Consent:    Consent obtained:  Verbal   Consent given by:  Patient   Alternatives discussed: sutures vs skin adhesive. Anesthesia (see MAR for exact dosages):    Anesthesia method:  None Laceration details:    Location:  Lip   Lip location:  Lower exterior lip   Length (cm):  1 Repair type:    Repair type:  Simple Exploration:    Contaminated: no   Treatment:    Visualized foreign bodies/material removed: no   Skin repair:    Repair method:  Tissue adhesive Approximation:    Approximation:  Close Post-procedure details:    Patient tolerance of procedure:  Tolerated well, no immediate complications     ____________________________________________   INITIAL IMPRESSION /  MDM / ASSESSMENT AND PLAN / ED COURSE  As part of my medical decision making, I reviewed the following data within the [place] History obtained from family, Nursing notes reviewed and incorporated, Notes from prior ED visits and [employer] Controlled Substance Database   Differential diagnosis includes, but is not limited to, facial fractures, intracranial bleeding, cervical spine fracture, lip laceration.  The patient appears well and is in no distress.  CT scans of the head, maxillofacial area, and cervical spine were all normal with no evidence of fracture or dislocation.  I discussed how best to repair the lip laceration with the patient and her son.  I explained that I can provide some local anesthetic and try to suture the lip but that it would be painful with the anesthetic, or could try to close it with tissue adhesive.  The patient repeatedly asked me not to inject her and to try with tissue adhesive and her son agreed.  I explained that it will be a less than perfect solution  but that it should be adequate and will heal with time regardless.  The wound opened up a little bit more than I would have liked while I was applying the tissue adhesive but because of the patient's dementia it was hard for her to remember to not talk during the procedure.  However I feel the closure was adequate.  There is no indication for empiric antibiotics and I am afraid that given her age and comorbidities if I start her on unnecessary antibiotics it may lead to other complications and that the risk of infection is relatively low.  I recommended a soft food diet until the lip heals and encourage close outpatient follow-up.  The patient's son and the patient both agree with the plan.  No evidence of an emergent medical condition that will require her to stay in the hospital and no indication for blood or other lab work.          ____________________________________________  FINAL CLINICAL  IMPRESSION(S) / ED DIAGNOSES  Final diagnoses:  Fall, initial encounter  Contusion of face, initial encounter  Lip laceration, initial encounter     MEDICATIONS GIVEN DURING THIS VISIT:  Medications - No data to display   ED Discharge Orders    None      *Please note:  Lisa Hahn was evaluated in Emergency Department on 10/22/2018 for the symptoms described in the history of present illness. She was evaluated in the context of the global COVID-19 pandemic, which necessitated consideration that the patient might be at risk for infection with the SARS-CoV-2 virus that causes COVID-19. Institutional protocols and algorithms that pertain to the evaluation of patients at risk for COVID-19 are in a state of rapid change based on information released by regulatory bodies including the CDC and federal and state organizations. These policies and algorithms were followed during the patient's care in the ED.  Some ED evaluations and interventions may be delayed as a result of limited staffing during the pandemic.*  Note:  This document was prepared using Dragon voice recognition software and may include unintentional dictation errors.   Hinda Kehr, MD 10/22/18 [phone]
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
## 2018-10-28 DIAGNOSIS — G4701 Insomnia due to medical condition: Secondary | ICD-10-CM | POA: Diagnosis not present

## 2018-10-28 DIAGNOSIS — F0151 Vascular dementia with behavioral disturbance: Secondary | ICD-10-CM | POA: Diagnosis not present

## 2018-11-03 DIAGNOSIS — R569 Unspecified convulsions: Secondary | ICD-10-CM | POA: Diagnosis not present

## 2018-11-03 DIAGNOSIS — F0281 Dementia in other diseases classified elsewhere with behavioral disturbance: Secondary | ICD-10-CM | POA: Diagnosis not present

## 2018-11-05 ENCOUNTER — Other Ambulatory Visit: Payer: Self-pay

## 2018-11-15 DIAGNOSIS — E1159 Type 2 diabetes mellitus with other circulatory complications: Secondary | ICD-10-CM | POA: Diagnosis not present

## 2018-11-15 DIAGNOSIS — I10 Essential (primary) hypertension: Secondary | ICD-10-CM | POA: Diagnosis not present

## 2018-11-15 DIAGNOSIS — F0280 Dementia in other diseases classified elsewhere without behavioral disturbance: Secondary | ICD-10-CM | POA: Diagnosis not present

## 2018-11-15 DIAGNOSIS — E1169 Type 2 diabetes mellitus with other specified complication: Secondary | ICD-10-CM | POA: Diagnosis not present

## 2018-12-16 DIAGNOSIS — F0280 Dementia in other diseases classified elsewhere without behavioral disturbance: Secondary | ICD-10-CM | POA: Diagnosis not present

## 2018-12-16 DIAGNOSIS — F0151 Vascular dementia with behavioral disturbance: Secondary | ICD-10-CM | POA: Diagnosis not present

## 2018-12-16 DIAGNOSIS — G4701 Insomnia due to medical condition: Secondary | ICD-10-CM | POA: Diagnosis not present

## 2019-01-04 DIAGNOSIS — F0280 Dementia in other diseases classified elsewhere without behavioral disturbance: Secondary | ICD-10-CM | POA: Diagnosis not present

## 2019-01-18 DIAGNOSIS — F0280 Dementia in other diseases classified elsewhere without behavioral disturbance: Secondary | ICD-10-CM | POA: Diagnosis not present

## 2019-01-22 DIAGNOSIS — Z79899 Other long term (current) drug therapy: Secondary | ICD-10-CM | POA: Diagnosis not present

## 2019-01-22 DIAGNOSIS — D649 Anemia, unspecified: Secondary | ICD-10-CM | POA: Diagnosis not present

## 2019-01-23 ENCOUNTER — Emergency Department: Payer: Medicare Other

## 2019-01-23 ENCOUNTER — Inpatient Hospital Stay
Admission: EM | Admit: 2019-01-23 | Discharge: 2019-01-25 | DRG: 871 | Disposition: A | Discharge status: Other Acute Inpatient Hospital | Payer: Medicare Other | Source: Skilled Nursing Facility | Attending: Specialist | Admitting: Specialist

## 2019-01-23 ENCOUNTER — Encounter: Payer: Self-pay | Admitting: Emergency Medicine

## 2019-01-23 ENCOUNTER — Other Ambulatory Visit: Payer: Self-pay

## 2019-01-23 DIAGNOSIS — Z79899 Other long term (current) drug therapy: Secondary | ICD-10-CM | POA: Diagnosis not present

## 2019-01-23 DIAGNOSIS — F0390 Unspecified dementia without behavioral disturbance: Secondary | ICD-10-CM | POA: Diagnosis present

## 2019-01-23 DIAGNOSIS — Z886 Allergy status to analgesic agent status: Secondary | ICD-10-CM | POA: Diagnosis not present

## 2019-01-23 DIAGNOSIS — J189 Pneumonia, unspecified organism: Secondary | ICD-10-CM | POA: Diagnosis not present

## 2019-01-23 DIAGNOSIS — I10 Essential (primary) hypertension: Secondary | ICD-10-CM | POA: Diagnosis present

## 2019-01-23 DIAGNOSIS — E860 Dehydration: Secondary | ICD-10-CM | POA: Diagnosis present

## 2019-01-23 DIAGNOSIS — E785 Hyperlipidemia, unspecified: Secondary | ICD-10-CM | POA: Diagnosis present

## 2019-01-23 DIAGNOSIS — E119 Type 2 diabetes mellitus without complications: Secondary | ICD-10-CM | POA: Diagnosis present

## 2019-01-23 DIAGNOSIS — E876 Hypokalemia: Secondary | ICD-10-CM | POA: Diagnosis not present

## 2019-01-23 DIAGNOSIS — A419 Sepsis, unspecified organism: Secondary | ICD-10-CM | POA: Diagnosis not present

## 2019-01-23 DIAGNOSIS — Z7984 Long term (current) use of oral hypoglycemic drugs: Secondary | ICD-10-CM

## 2019-01-23 DIAGNOSIS — A4189 Other specified sepsis: Principal | ICD-10-CM | POA: Diagnosis present

## 2019-01-23 DIAGNOSIS — G9341 Metabolic encephalopathy: Secondary | ICD-10-CM | POA: Diagnosis present

## 2019-01-23 DIAGNOSIS — N390 Urinary tract infection, site not specified: Secondary | ICD-10-CM | POA: Diagnosis present

## 2019-01-23 DIAGNOSIS — Z7982 Long term (current) use of aspirin: Secondary | ICD-10-CM | POA: Diagnosis not present

## 2019-01-23 DIAGNOSIS — J1289 Other viral pneumonia: Secondary | ICD-10-CM | POA: Diagnosis present

## 2019-01-23 DIAGNOSIS — J80 Acute respiratory distress syndrome: Secondary | ICD-10-CM | POA: Diagnosis not present

## 2019-01-23 DIAGNOSIS — Z79891 Long term (current) use of opiate analgesic: Secondary | ICD-10-CM | POA: Diagnosis not present

## 2019-01-23 DIAGNOSIS — E870 Hyperosmolality and hypernatremia: Secondary | ICD-10-CM | POA: Diagnosis present

## 2019-01-23 DIAGNOSIS — U071 COVID-19: Secondary | ICD-10-CM | POA: Diagnosis present

## 2019-01-23 DIAGNOSIS — R0902 Hypoxemia: Secondary | ICD-10-CM | POA: Diagnosis not present

## 2019-01-23 DIAGNOSIS — Z90710 Acquired absence of both cervix and uterus: Secondary | ICD-10-CM

## 2019-01-23 DIAGNOSIS — R0602 Shortness of breath: Secondary | ICD-10-CM | POA: Diagnosis not present

## 2019-01-23 DIAGNOSIS — Z8261 Family history of arthritis: Secondary | ICD-10-CM | POA: Diagnosis not present

## 2019-01-23 DIAGNOSIS — D649 Anemia, unspecified: Secondary | ICD-10-CM | POA: Diagnosis not present

## 2019-01-23 DIAGNOSIS — R404 Transient alteration of awareness: Secondary | ICD-10-CM | POA: Diagnosis not present

## 2019-01-23 DIAGNOSIS — Z8673 Personal history of transient ischemic attack (TIA), and cerebral infarction without residual deficits: Secondary | ICD-10-CM

## 2019-01-23 DIAGNOSIS — Z209 Contact with and (suspected) exposure to unspecified communicable disease: Secondary | ICD-10-CM | POA: Diagnosis not present

## 2019-01-23 DIAGNOSIS — Z87891 Personal history of nicotine dependence: Secondary | ICD-10-CM | POA: Diagnosis not present

## 2019-01-23 DIAGNOSIS — R000 Tachycardia, unspecified: Secondary | ICD-10-CM | POA: Diagnosis not present

## 2019-01-23 DIAGNOSIS — R319 Hematuria, unspecified: Secondary | ICD-10-CM | POA: Diagnosis not present

## 2019-01-23 HISTORY — DX: Unspecified dementia, unspecified severity, without behavioral disturbance, psychotic disturbance, mood disturbance, and anxiety: F03.90

## 2019-01-23 LAB — COMPREHENSIVE METABOLIC PANEL
ALT: 16 U/L (ref 0–44)
AST: 32 U/L (ref 15–41)
Albumin: 3.2 g/dL — ABNORMAL LOW (ref 3.5–5.0)
Alkaline Phosphatase: 37 U/L — ABNORMAL LOW (ref 38–126)
Anion gap: 14 (ref 5–15)
BUN: 50 mg/dL — ABNORMAL HIGH (ref 8–23)
CO2: 28 mmol/L (ref 22–32)
Calcium: 9.7 mg/dL (ref 8.9–10.3)
Chloride: 113 mmol/L — ABNORMAL HIGH (ref 98–111)
Creatinine, Ser: 0.77 mg/dL (ref 0.44–1.00)
GFR calc Af Amer: >60 mL/min (ref >60)
GFR calc non Af Amer: >60 mL/min (ref >60)
Glucose, Bld: 195 mg/dL — ABNORMAL HIGH (ref 70–99)
Potassium: 4.2 mmol/L (ref 3.5–5.1)
Sodium: 155 mmol/L — ABNORMAL HIGH (ref 135–145)
Total Bilirubin: 1.6 mg/dL — ABNORMAL HIGH (ref 0.3–1.2)
Total Protein: 8.4 g/dL — ABNORMAL HIGH (ref 6.5–8.1)

## 2019-01-23 LAB — MRSA PCR SCREENING: MRSA by PCR: NEGATIVE

## 2019-01-23 LAB — CBC WITH DIFFERENTIAL/PLATELET
Abs Immature Granulocytes: 0.08 10*3/uL — ABNORMAL HIGH (ref 0.00–0.07)
Basophils Absolute: 0 10*3/uL (ref 0.0–0.1)
Basophils Relative: 0 %
Eosinophils Absolute: 0 10*3/uL (ref 0.0–0.5)
Eosinophils Relative: 0 %
HCT: 48.1 % — ABNORMAL HIGH (ref 36.0–46.0)
Hemoglobin: 15.3 g/dL — ABNORMAL HIGH (ref 12.0–15.0)
Immature Granulocytes: 1 %
Lymphocytes Relative: 17 %
Lymphs Abs: 1.9 10*3/uL (ref 0.7–4.0)
MCH: 28.2 pg (ref 26.0–34.0)
MCHC: 31.8 g/dL (ref 30.0–36.0)
MCV: 88.6 fL (ref 80.0–100.0)
Monocytes Absolute: 0.8 10*3/uL (ref 0.1–1.0)
Monocytes Relative: 7 %
Neutro Abs: 8 10*3/uL — ABNORMAL HIGH (ref 1.7–7.7)
Neutrophils Relative %: 75 %
Platelets: 223 10*3/uL (ref 150–400)
RBC: 5.43 MIL/uL — ABNORMAL HIGH (ref 3.87–5.11)
RDW: 16 % — ABNORMAL HIGH (ref 11.5–15.5)
Smear Review: NORMAL
WBC: 10.8 10*3/uL — ABNORMAL HIGH (ref 4.0–10.5)
nRBC: 0 % (ref 0.0–0.2)

## 2019-01-23 LAB — URINALYSIS, COMPLETE (UACMP) WITH MICROSCOPIC
Bilirubin Urine: NEGATIVE
Glucose, UA: NEGATIVE mg/dL
Ketones, ur: NEGATIVE mg/dL
Nitrite: POSITIVE — AB
Protein, ur: 100 mg/dL — AB
Specific Gravity, Urine: 1.021 (ref 1.005–1.030)
Squamous Epithelial / LPF: NONE SEEN (ref 0–5)
WBC, UA: >50 WBC/hpf — ABNORMAL HIGH (ref 0–5)
pH: 5 (ref 5.0–8.0)

## 2019-01-23 LAB — LACTIC ACID, PLASMA
Lactic Acid, Venous: 2.2 mmol/L (ref 0.5–1.9)
Lactic Acid, Venous: 1.9 mmol/L (ref 0.5–1.9)

## 2019-01-23 LAB — GLUCOSE, CAPILLARY: Glucose-Capillary: 163 mg/dL — ABNORMAL HIGH (ref 70–99)

## 2019-01-23 LAB — SARS CORONAVIRUS 2 (TAT 6-24 HRS): SARS Coronavirus 2: POSITIVE — AB

## 2019-01-23 MED ORDER — SODIUM CHLORIDE 0.9 % IV SOLN
2.0000 g | Freq: Two times a day (BID) | INTRAVENOUS | Status: DC
  Administered 2019-01-24 (×2): 2 g via INTRAVENOUS
  Filled 2019-01-23 (×3): qty 2

## 2019-01-23 MED ORDER — ASPIRIN EC 81 MG PO TBEC
81.0000 mg | DELAYED_RELEASE_TABLET | Freq: Every day | ORAL | Status: DC
  Administered 2019-01-24: 81 mg via ORAL
  Filled 2019-01-23: qty 1

## 2019-01-23 MED ORDER — LACTATED RINGERS IV BOLUS
500.0000 mL | Freq: Once | INTRAVENOUS | Status: AC
  Administered 2019-01-23: 500 mL via INTRAVENOUS

## 2019-01-23 MED ORDER — ENOXAPARIN SODIUM 40 MG/0.4ML ~~LOC~~ SOLN
40.0000 mg | SUBCUTANEOUS | Status: DC
  Administered 2019-01-23 – 2019-01-24 (×2): 40 mg via SUBCUTANEOUS
  Filled 2019-01-23 (×2): qty 0.4

## 2019-01-23 MED ORDER — METFORMIN HCL 500 MG PO TABS
1000.0000 mg | ORAL_TABLET | Freq: Two times a day (BID) | ORAL | Status: DC
  Administered 2019-01-24: 1000 mg via ORAL
  Filled 2019-01-23: qty 2

## 2019-01-23 MED ORDER — VANCOMYCIN HCL IN DEXTROSE 1-5 GM/200ML-% IV SOLN
1000.0000 mg | INTRAVENOUS | Status: DC
  Filled 2019-01-23: qty 200

## 2019-01-23 MED ORDER — TRAMADOL HCL 50 MG PO TABS
50.0000 mg | ORAL_TABLET | Freq: Four times a day (QID) | ORAL | Status: DC | PRN
  Administered 2019-01-24: 50 mg via ORAL
  Filled 2019-01-23: qty 1

## 2019-01-23 MED ORDER — ONDANSETRON HCL 4 MG PO TABS: 4.0000 mg | ORAL_TABLET | Freq: Four times a day (QID) | ORAL | Status: DC | PRN

## 2019-01-23 MED ORDER — VITAMIN D 25 MCG (1000 UNIT) PO TABS
1000.0000 [IU] | ORAL_TABLET | Freq: Every day | ORAL | Status: DC
  Administered 2019-01-24: 1000 [IU] via ORAL

## 2019-01-23 MED ORDER — SODIUM CHLORIDE 0.9 % IV SOLN
1.0000 g | Freq: Once | INTRAVENOUS | Status: AC
  Administered 2019-01-23: 12:00:00 1 g via INTRAVENOUS
  Filled 2019-01-23: qty 10

## 2019-01-23 MED ORDER — ACETAMINOPHEN 325 MG PO TABS: 650.0000 mg | ORAL_TABLET | Freq: Four times a day (QID) | ORAL | Status: DC | PRN

## 2019-01-23 MED ORDER — LACTATED RINGERS IV SOLN
INTRAVENOUS | Status: DC
  Administered 2019-01-23: 10:00:00 via INTRAVENOUS

## 2019-01-23 MED ORDER — SODIUM CHLORIDE 0.9 % IV SOLN: 1.0000 g | INTRAVENOUS | Status: DC

## 2019-01-23 MED ORDER — AZITHROMYCIN 500 MG IV SOLR
500.0000 mg | Freq: Once | INTRAVENOUS | Status: AC
  Administered 2019-01-23: 13:00:00 500 mg via INTRAVENOUS
  Filled 2019-01-23: qty 500

## 2019-01-23 MED ORDER — ATORVASTATIN CALCIUM 20 MG PO TABS
40.0000 mg | ORAL_TABLET | Freq: Every day | ORAL | Status: DC
  Administered 2019-01-24: 40 mg via ORAL
  Filled 2019-01-23: qty 2

## 2019-01-23 MED ORDER — SODIUM CHLORIDE 0.9 % IV SOLN
2.0000 g | Freq: Once | INTRAVENOUS | Status: AC
  Administered 2019-01-23: 2 g via INTRAVENOUS
  Filled 2019-01-23: qty 2

## 2019-01-23 MED ORDER — LINAGLIPTIN 5 MG PO TABS
5.0000 mg | ORAL_TABLET | Freq: Every day | ORAL | Status: DC
  Administered 2019-01-24: 5 mg via ORAL
  Filled 2019-01-23 (×2): qty 1

## 2019-01-23 MED ORDER — DEXTROSE-NACL 5-0.9 % IV SOLN
INTRAVENOUS | Status: DC
  Administered 2019-01-23 – 2019-01-24 (×3): via INTRAVENOUS

## 2019-01-23 MED ORDER — SENNOSIDES-DOCUSATE SODIUM 8.6-50 MG PO TABS: 1.0000 | ORAL_TABLET | Freq: Every evening | ORAL | Status: DC | PRN

## 2019-01-23 MED ORDER — SODIUM CHLORIDE 0.9 % IV SOLN: 500.0000 mg | INTRAVENOUS | Status: DC

## 2019-01-23 MED ORDER — VANCOMYCIN HCL 1.5 G IV SOLR
1500.0000 mg | Freq: Once | INTRAVENOUS | Status: AC
  Administered 2019-01-23: 1500 mg via INTRAVENOUS
  Filled 2019-01-23: qty 1500

## 2019-01-23 MED ORDER — ACETAMINOPHEN 650 MG RE SUPP: 650.0000 mg | Freq: Four times a day (QID) | RECTAL | Status: DC | PRN

## 2019-01-23 MED ORDER — ONDANSETRON HCL 4 MG/2ML IJ SOLN: 4.0000 mg | Freq: Four times a day (QID) | INTRAMUSCULAR | Status: DC | PRN

## 2019-01-23 NOTE — ED Notes (Signed)
Full rainbow, plus lactic acid, and COVID swab sent to lab

## 2019-01-23 NOTE — H&P (Signed)
Lisa Hahn at [facility] NAME: Lisa Hahn    MR#:  [id number]  DATE OF BIRTH:  [birth year]  DATE OF ADMISSION:  01/23/2019  PRIMARY CARE PHYSICIAN: Marco Collie, MD   REQUESTING/REFERRING PHYSICIAN:   CHIEF COMPLAINT:   Chief Complaint  Patient presents with  . Shortness of Breath    HISTORY OF PRESENT ILLNESS: Lisa Hahn  is a 78 y.o. female with a known history of dementia, CVA in the past, hypertension, type 2 diabetes mellitus, arthritis presented to the emergency room from [place].  Patient was found to have fever shortness of breath tachycardic and had low blood pressure.  Patient is nonverbal.  She was evaluated in the emergency room COVID-19 test is pending.  Urinalysis showed infection chest x-ray showed bilateral pneumonia.  Patient started on IV antibiotics.  Hospitalist service was consulted.  Not much history could be obtained from the patient.  She was also given fluid bolus in the emergency room for low blood pressure.  Sodium level is elevated.  PAST MEDICAL HISTORY:   Past Medical History:  Diagnosis Date  . Arthritis   . Dementia ([place])   . Diabetes mellitus without complication ([place])   . Hypertension   . Stroke [facility])     PAST SURGICAL HISTORY:  Past Surgical History:  Procedure Laterality Date  . ABDOMINAL HYSTERECTOMY    . ABDOMINAL SURGERY     blockage     SOCIAL HISTORY:  Social History   Tobacco Use  . Smoking status: Former Research scientist (life sciences)  . Smokeless tobacco: Never Used  Substance Use Topics  . Alcohol use: No    FAMILY HISTORY:  Family History  Problem Relation Age of Onset  . Arthritis Other        Parent    DRUG ALLERGIES:  Allergies  Allergen Reactions  . Aspirin Nausea And Vomiting    Can tolerate baby aspirin  . Motrin [Ibuprofen] Other (See Comments)    Reaction: Burns stomach    REVIEW OF SYSTEMS:   Patient nonverbal could not be obtained MEDICATIONS AT HOME:   Prior to Admission medications   Medication Sig Start Date End Date Taking? Authorizing Provider  acetaminophen (TYLENOL) 325 MG tablet Take 2 tablets (650 mg total) by mouth every 6 (six) hours as needed. Patient taking differently: Take 650 mg by mouth every 6 (six) hours as needed for mild pain.  11/23/16   Carrie Mew, MD  aspirin EC 81 MG tablet Take 1 tablet (81 mg total) by mouth daily. 01/28/17   Dustin Flock, MD  atorvastatin (LIPITOR) 40 MG tablet Take 40 mg by mouth daily.    [provider]  blood glucose meter kit and supplies KIT Dispense based on patient and insurance preference. Test 3 times daily. ICD 10 code E11.9. 01/31/17   Leone Haven, MD  cephALEXin (KEFLEX) 500 MG capsule Take 1 capsule (500 mg total) by mouth 2 (two) times daily. 05/18/17   Hinda Kehr, MD  cholecalciferol (VITAMIN D) 1000 units tablet Take 1 tablet (1,000 Units total) by mouth daily. 01/28/17   Dustin Flock, MD  JANUVIA 100 MG tablet Take 100 mg by mouth daily. 12/04/16   [provider]  metFORMIN (GLUCOPHAGE) 1000 MG tablet Take 1 tablet (1,000 mg total) by mouth 2 (two) times daily with a meal. 01/28/17   Dustin Flock, MD  ONE TOUCH ULTRA TEST test strip USE TO TEST BLOOD SUGAR 3 TIMES DAILY 03/10/17  Leone Haven, MD  [facility] DELICA LANCETS FINE MISC USE TO TEST BLOOD SUGAR 3 TIMES DAILY 03/10/17   Leone Haven, MD  potassium chloride SA (KLOR-CON M20) 20 MEQ tablet Take 1 tablet (20 mEq total) by mouth daily. 05/18/17   Hinda Kehr, MD  traMADol (ULTRAM) 50 MG tablet Take 50 mg by mouth every 6 (six) hours as needed.    [provider]      PHYSICAL EXAMINATION:   VITAL SIGNS: Blood pressure 118/89, pulse (!) 121, temperature 97.6 F (36.4 C), temperature source Axillary, resp. rate (!) 24, weight 65.8 kg, SpO2 97 %.  GENERAL:  78 y.o.-year-old patient lying in the bed  EYES: Pupils equal, round, reactive to light and accommodation.  No scleral icterus. Extraocular muscles intact.  HEENT: Head atraumatic, normocephalic. Oropharynx dry NECK:  Supple, no jugular venous distention. No thyroid enlargement, no tenderness.  LUNGS: Bilateral decreased airflow, Rales heard in both lungs CARDIOVASCULAR: S1, S2 tachycardia noted. No murmurs, rubs, or gallops.  ABDOMEN: Soft, nontender, nondistended. Bowel sounds present. No organomegaly or mass.  EXTREMITIES: No pedal edema, cyanosis, or clubbing.  NEUROLOGIC: Awake but nonverbal awake  Moves extremities Complete nervous system exam was not feasible PSYCHIATRIC: COULD not be assessed SKIN: No obvious rash, lesion, or ulcer.   LABORATORY PANEL:   CBC Recent Labs  Lab 01/23/19 0915  WBC 10.8*  HGB 15.3*  HCT 48.1*  PLT 223  MCV 88.6  MCH 28.2  MCHC 31.8  RDW 16.0*  LYMPHSABS 1.9  MONOABS 0.8  EOSABS 0.0  BASOSABS 0.0   ------------------------------------------------------------------------------------------------------------------  Chemistries  Recent Labs  Lab 01/23/19 0915  NA 155*  K 4.2  CL 113*  CO2 28  GLUCOSE 195*  BUN 50*  CREATININE 0.77  CALCIUM 9.7  AST 32  ALT 16  ALKPHOS 37*  BILITOT 1.6*   ------------------------------------------------------------------------------------------------------------------ estimated creatinine clearance is 52.2 mL/min (by C-G formula based on SCr of 0.77 mg/dL). ------------------------------------------------------------------------------------------------------------------ No results for input(s): TSH, T4TOTAL, T3FREE, THYROIDAB in the last 72 hours.  Invalid input(s): FREET3   Coagulation profile No results for input(s): INR, PROTIME in the last 168 hours. ------------------------------------------------------------------------------------------------------------------- No results for input(s): DDIMER in the last 72  hours. -------------------------------------------------------------------------------------------------------------------  Cardiac Enzymes No results for input(s): CKMB, TROPONINI, MYOGLOBIN in the last 168 hours.  Invalid input(s): CK ------------------------------------------------------------------------------------------------------------------ Invalid input(s): POCBNP  ---------------------------------------------------------------------------------------------------------------  Urinalysis    Component Value Date/Time   COLORURINE AMBER (A) 01/23/2019 1031   APPEARANCEUR CLOUDY (A) 01/23/2019 1031   APPEARANCEUR Clear 02/25/2014 1352   LABSPEC 1.021 01/23/2019 1031   LABSPEC 1.015 02/25/2014 1352   PHURINE 5.0 01/23/2019 1031   GLUCOSEU NEGATIVE 01/23/2019 1031   GLUCOSEU Negative 02/25/2014 1352   HGBUR SMALL (A) 01/23/2019 1031   BILIRUBINUR NEGATIVE 01/23/2019 1031   BILIRUBINUR negative 08/22/2016 0900   BILIRUBINUR Negative 02/25/2014 1352   KETONESUR NEGATIVE 01/23/2019 1031   PROTEINUR 100 (A) 01/23/2019 1031   UROBILINOGEN 0.2 08/22/2016 0900   UROBILINOGEN 1.0 07/15/2010 0827   NITRITE POSITIVE (A) 01/23/2019 1031   LEUKOCYTESUR MODERATE (A) 01/23/2019 1031   LEUKOCYTESUR 1+ 02/25/2014 1352     RADIOLOGY: Dg Chest Port 1 View  Result Date: 01/23/2019 CLINICAL DATA:  Shortness of breath EXAM: PORTABLE CHEST 1 VIEW COMPARISON:  01/27/2017 FINDINGS: Mild patchy opacity in the lingula and bilateral lower lobes. No pleural effusion or pneumothorax. The heart is normal in size. IMPRESSION: Mild patchy opacity in the lingula and bilateral lower lobes, worrisome for multifocal pneumonia.  Electronically Signed   By: Julian Hy M.D.   On: 01/23/2019 10:25    EKG: Orders placed or performed during the hospital encounter of 01/23/19  . EKG 12-Lead  . EKG 12-Lead  . ED EKG  . ED EKG    IMPRESSION AND PLAN: 78 year old female patient with a known history  of dementia, CVA in the past, hypertension, type 2 diabetes mellitus, arthritis presented to the emergency room from [place].  Patient was found to have fever shortness of breath tachycardic and had low blood pressure.  -Sepsis Admit patient to medical floor Broad-spectrum IV antibiotics Follow-up cultures COVID-19 test pending  -Urinary tract infection acute IV antibiotics and cultures  -Bilateral pneumonia COVID-19 test pending Broad-spectrum antibiotics as patient is from facility [place] and contact precautions until COVID-19 test result is obtained  -Acute hypernatremia IV fluid hydration follow-up sodium levels  -Dehydration IV fluids  -History of dementia and CVA Supportive care  All the records are reviewed and case discussed with ED provider. Management plans discussed with the patient, family and they are in agreement.  CODE STATUS: Full code Code Status History    Date Active Date Inactive Code Status Order ID Comments User Context   01/27/2017 0246 01/28/2017 1931 Full Code [id number]  Harrie Foreman, MD Inpatient   Advance Care Planning Activity       TOTAL TIME TAKING CARE OF THIS PATIENT: 56 minutes.    Saundra Shelling M.D on 01/23/2019 at 12:13 PM  Between 7am to 6pm - Pager - [phone]  After 6pm go to [email or web address] - password [email or web address] [place] [place] [facility]  Office  [phone]  CC: Primary care physician; Marco Collie, MD

## 2019-01-23 NOTE — ED Triage Notes (Signed)
Pt to ED via [facility] from [facility] for respiratory distress. Per EMS when they arrived pt was not in distress. Pt was on 2 liter SpO2 with O2 sats at 98%. Per noted from facility pt was febrile, hypotensive, and tachycardic this morning. On arrival to ED pt was tachycardic, however, was afebrile and normotensive. Pt has hx/o dementia, non verbal. Pt is alert.

## 2019-01-23 NOTE — Progress Notes (Signed)
Pt arrived to room 233 via stretcher from ED.  Safety precautions in place.  Bed low, brakes locked, call light within reach.  Pt oriented to room, however reinforcement required due to non-verbal status.  Pt is alert and communicates via grunts and moans.

## 2019-01-23 NOTE — Progress Notes (Signed)
Pharmacy Antibiotic Note  Lisa Hahn is a 78 y.o. female admitted on 01/23/2019 with pneumonia and sepsis.  Pharmacy has been consulted for cefepime and vancomycin dosing.  Plan: Cefepime 2 g IV q12h  Vancomycin 1500 mg IV x1 loading dose Vancomycin 1000 mg IV Q 24 hrs. Goal AUC 400-550. Expected AUC: 443 Expected Cmin 10.3 SCr used: 0.8  Will order MRSA PCR to help guide therapy SCr in AM to continue to monitor renal function and dosing.    Weight: 145 lb 1 oz (65.8 kg)  Temp (24hrs), Avg:97.6 F (36.4 C), Min:97.6 F (36.4 C), Max:97.6 F (36.4 C)  Recent Labs  Lab 01/23/19 0915  WBC 10.8*  CREATININE 0.77  LATICACIDVEN 2.2*    Estimated Creatinine Clearance: 52.2 mL/min (by C-G formula based on SCr of 0.77 mg/dL).    Allergies  Allergen Reactions  . Aspirin Nausea And Vomiting    Can tolerate baby aspirin  . Motrin [Ibuprofen] Other (See Comments)    Reaction: Burns stomach    Antimicrobials this admission: Ceftriaxone/azithromycin x1 in ED 10/18 Cefepime 10/18>> Vancomycin 10/18>>  Dose adjustments this admission:   Microbiology results:  BCx: sent  MRSA PCR: ordered  Thank you for allowing pharmacy to be a part of this patient's care.  Rocky Morel 01/23/2019 2:29 PM

## 2019-01-23 NOTE — Plan of Care (Signed)

## 2019-01-23 NOTE — ED Notes (Signed)
Attempted to call report. Unable to do so at this time.  

## 2019-01-23 NOTE — ED Notes (Signed)
Attempting to feed pt apple sauce or jello. Pt able to take a couple spoon fulls but then started to turn head away from nurse and closing mouth.

## 2019-01-23 NOTE — ED Provider Notes (Signed)
[facility] Emergency Department Provider Note  ____________________________________________   First MD Initiated Contact with Patient 01/23/19 [phone]     (approximate)  I have reviewed the triage vital signs and the nursing notes.  History  Chief Complaint Shortness of Breath    HPI Lisa Hahn is a 78 y.o. female with a history of severe dementia, diabetes, hypertension, stroke who presents from her assisted living facility who called out for reported respiratory distress.  However, on EMS arrival, patient was not noted to be in any respiratory distress.  She did have slightly low oxygen saturation on room air to the low 90s, placed on 2 L nasal cannula with improvement.  Per paperwork with patient, there was report of a fever and low blood pressure yesterday, however on arrival she is afebrile and normotensive, though is tachycardic.  She does not appear to be in any acute respiratory distress, but is requiring 2 L [place].  Patient cannot provide any history due to her severe dementia, primarily obtained from chart review and per EMS report.  Of note, patient does reside at [facility] facility where there is known to be a COVID outbreak.   Past Medical Hx Past Medical History:  Diagnosis Date  . Arthritis   . Dementia ([place])   . Diabetes mellitus without complication ([place])   . Hypertension   . Stroke [facility])     Problem List Patient Active Problem List   Diagnosis Date Noted  . UTI (urinary tract infection) 02/03/2017  . Syncope 01/27/2017  . Decreased pedal pulses 08/22/2016  . Hyperlipidemia 08/22/2016  . Poor dentition 04/16/2016  . Abdominal pain 04/16/2016  . Onychomycosis 04/16/2016  . Lightheadedness 08/15/2015  . Abdominal pain, chronic, epigastric 08/15/2015  . Dementia ([place]) 07/25/2015  . Chest pain 06/28/2015  . Strain of left quadriceps tendon 06/28/2015  . Type 2 diabetes mellitus ([place]) 06/28/2015  . Vision changes  06/28/2015    Past Surgical Hx Past Surgical History:  Procedure Laterality Date  . ABDOMINAL HYSTERECTOMY    . ABDOMINAL SURGERY     blockage     Medications Prior to Admission medications   Medication Sig Start Date End Date Taking? Authorizing Provider  acetaminophen (TYLENOL) 325 MG tablet Take 2 tablets (650 mg total) by mouth every 6 (six) hours as needed. Patient taking differently: Take 650 mg by mouth every 6 (six) hours as needed for mild pain.  11/23/16   Carrie Mew, MD  aspirin EC 81 MG tablet Take 1 tablet (81 mg total) by mouth daily. 01/28/17   Dustin Flock, MD  atorvastatin (LIPITOR) 40 MG tablet Take 40 mg by mouth daily.    [provider]  blood glucose meter kit and supplies KIT Dispense based on patient and insurance preference. Test 3 times daily. ICD 10 code E11.9. 01/31/17   Leone Haven, MD  cephALEXin (KEFLEX) 500 MG capsule Take 1 capsule (500 mg total) by mouth 2 (two) times daily. 05/18/17   Hinda Kehr, MD  cholecalciferol (VITAMIN D) 1000 units tablet Take 1 tablet (1,000 Units total) by mouth daily. 01/28/17   Dustin Flock, MD  JANUVIA 100 MG tablet Take 100 mg by mouth daily. 12/04/16   [provider]  metFORMIN (GLUCOPHAGE) 1000 MG tablet Take 1 tablet (1,000 mg total) by mouth 2 (two) times daily with a meal. 01/28/17   Dustin Flock, MD  ONE TOUCH ULTRA TEST test strip USE TO TEST BLOOD SUGAR 3 TIMES DAILY 03/10/17  Leone Haven, MD  [facility] DELICA LANCETS FINE MISC USE TO TEST BLOOD SUGAR 3 TIMES DAILY 03/10/17   Leone Haven, MD  potassium chloride SA (KLOR-CON M20) 20 MEQ tablet Take 1 tablet (20 mEq total) by mouth daily. 05/18/17   Hinda Kehr, MD  traMADol (ULTRAM) 50 MG tablet Take 50 mg by mouth every 6 (six) hours as needed.    [provider]    Allergies Aspirin and Motrin [ibuprofen]  Family Hx Family History  Problem Relation Age of Onset  . Arthritis Other        Parent     Social Hx Social History   Tobacco Use  . Smoking status: Former Research scientist (life sciences)  . Smokeless tobacco: Never Used  Substance Use Topics  . Alcohol use: No  . Drug use: No     Review of Systems Unable to obtain due to patient's severe dementia.   Physical Exam  Vital Signs: ED Triage Vitals  Enc Vitals Group     BP 01/23/19 0915 111/82     Pulse Rate 01/23/19 0915 (!) 126     Resp 01/23/19 0915 (!) 24     Temp 01/23/19 0920 97.6 F (36.4 C)     Temp Source 01/23/19 0920 Axillary     SpO2 01/23/19 0915 97 %     Weight 01/23/19 0936 145 lb 1 oz (65.8 kg)     Height --      Head Circumference --      Peak Flow --      Pain Score --      Pain Loc --      Pain Edu? --      Excl. in [place]? --     Constitutional: Awake, demented, cannot provide any history. Head: Normocephalic. Atraumatic. Eyes: Conjunctivae clear. Sclera anicteric. Nose: No congestion. No rhinorrhea. Mouth/Throat: Mucous membranes are very dry.  Neck: No stridor.   Cardiovascular: Tachycardic. Extremities well perfused. Respiratory: Normal respiratory effort.  On 2 L .  Gastrointestinal: Soft. Non-tender. Non-distended.  Musculoskeletal: No lower extremity edema. No deformities. Neurologic: Demented. Moves all extremities.  Skin: Skin is warm, dry and intact. No rash noted. Psychiatric: Mood and affect are appropriate for situation.  EKG  Personally reviewed.   Rate: 132 Rhythm: sinus Axis: normal Intervals: WNL Sinus tachycardia No STEMI    Radiology  XR: IMPRESSION:  Mild patchy opacity in the lingula and bilateral lower lobes,  worrisome for multifocal pneumonia.    Procedures  Procedure(s) performed (including critical care):  .Critical Care Performed by: Lilia Pro., MD Authorized by: Lilia Pro., MD   Critical care provider statement:    Critical care time (minutes):  40   Critical care was necessary to treat or prevent imminent or life-threatening deterioration of  the following conditions:  Sepsis and respiratory failure   Critical care was time spent personally by me on the following activities:  Discussions with consultants, evaluation of patient's response to treatment, examination of patient, ordering and performing treatments and interventions, ordering and review of laboratory studies, ordering and review of radiographic studies, pulse oximetry, re-evaluation of patient's condition, obtaining history from patient or surrogate and review of old charts     Initial Impression / Assessment and Plan / ED Course  78 y.o. female who presents to the ED for reported respiratory distress, and fever/low blood pressure yesterday.  Patient with history of severe dementia, and cannot provide any further history.  Ddx: COVID, other pulmonary infection, UTI,  electrolyte derangement  Plan: labs, XR, urine  Work-up reveals significant UTI.  Also evidence of dehydration with sodium 155, chloride 113.  Lactate is 2.2.  Will resuscitate with LR given her hypernatremia.  XR with patchy opacities in the lingula and bilateral lower lobes.  Given this, as well as her concomitant UTI, will cover with ceftriaxone and azithromycin.  Although knowing these chest x-ray findings could be related to potential COVID infection given the outbreak at her facility.  Swab sent and is pending.  Will discuss with hospitalist for admission.   Final Clinical Impression(s) / ED Diagnosis  Final diagnoses:  Urinary tract infection in elderly patient  Dehydration  Hypernatremia       Note:  This document was prepared using Dragon voice recognition software and may include unintentional dictation errors.   Lilia Pro., MD 01/23/19 [phone]

## 2019-01-23 NOTE — ED Notes (Signed)
Verbal order given by Dr. Joan Mayans to bolus 500 mL of LR and then start maintance dose of 100 mL per hour.

## 2019-01-24 LAB — C-REACTIVE PROTEIN: CRP: 9.7 mg/dL — ABNORMAL HIGH (ref <1.0)

## 2019-01-24 LAB — BASIC METABOLIC PANEL
Anion gap: 10 (ref 5–15)
BUN: 35 mg/dL — ABNORMAL HIGH (ref 8–23)
CO2: 30 mmol/L (ref 22–32)
Calcium: 8.9 mg/dL (ref 8.9–10.3)
Chloride: 120 mmol/L — ABNORMAL HIGH (ref 98–111)
Creatinine, Ser: 0.52 mg/dL (ref 0.44–1.00)
GFR calc Af Amer: >60 mL/min (ref >60)
GFR calc non Af Amer: >60 mL/min (ref >60)
Glucose, Bld: 204 mg/dL — ABNORMAL HIGH (ref 70–99)
Potassium: 3 mmol/L — ABNORMAL LOW (ref 3.5–5.1)
Sodium: 160 mmol/L — ABNORMAL HIGH (ref 135–145)

## 2019-01-24 LAB — CBC
HCT: 42.9 % (ref 36.0–46.0)
Hemoglobin: 13 g/dL (ref 12.0–15.0)
MCH: 28 pg (ref 26.0–34.0)
MCHC: 30.3 g/dL (ref 30.0–36.0)
MCV: 92.5 fL (ref 80.0–100.0)
Platelets: 194 10*3/uL (ref 150–400)
RBC: 4.64 MIL/uL (ref 3.87–5.11)
RDW: 16 % — ABNORMAL HIGH (ref 11.5–15.5)
WBC: 12.3 10*3/uL — ABNORMAL HIGH (ref 4.0–10.5)
nRBC: 0 % (ref 0.0–0.2)

## 2019-01-24 LAB — GLUCOSE, CAPILLARY
Glucose-Capillary: 131 mg/dL — ABNORMAL HIGH (ref 70–99)
Glucose-Capillary: 196 mg/dL — ABNORMAL HIGH (ref 70–99)
Glucose-Capillary: 224 mg/dL — ABNORMAL HIGH (ref 70–99)
Glucose-Capillary: 264 mg/dL — ABNORMAL HIGH (ref 70–99)

## 2019-01-24 LAB — FIBRIN DERIVATIVES D-DIMER (ARMC ONLY): Fibrin derivatives D-dimer (ARMC): 3831.73 ng/mL (FEU) — ABNORMAL HIGH (ref 0.00–499.00)

## 2019-01-24 MED ORDER — ENOXAPARIN SODIUM 40 MG/0.4ML ~~LOC~~ SOLN: 40.0000 mg | SUBCUTANEOUS | Status: DC

## 2019-01-24 MED ORDER — TRAMADOL HCL 50 MG PO TABS: 50.0000 mg | ORAL_TABLET | Freq: Four times a day (QID) | ORAL | Status: DC | PRN

## 2019-01-24 MED ORDER — SODIUM CHLORIDE 0.9 % IV SOLN: 2.0000 g | Freq: Two times a day (BID) | INTRAVENOUS | Status: DC

## 2019-01-24 MED ORDER — SODIUM CHLORIDE 0.9 % IV SOLN
200.0000 mg | Freq: Once | INTRAVENOUS | Status: AC
  Administered 2019-01-24: 200 mg via INTRAVENOUS
  Filled 2019-01-24: qty 40

## 2019-01-24 MED ORDER — INSULIN ASPART 100 UNIT/ML ~~LOC~~ SOLN
0.0000 [IU] | Freq: Three times a day (TID) | SUBCUTANEOUS | Status: DC
  Administered 2019-01-24: 3 [IU] via SUBCUTANEOUS
  Administered 2019-01-24: 2 [IU] via SUBCUTANEOUS
  Administered 2019-01-24: 1 [IU] via SUBCUTANEOUS
  Filled 2019-01-24 (×3): qty 1

## 2019-01-24 MED ORDER — SODIUM CHLORIDE 0.9 % IV SOLN: 100.0000 mg | INTRAVENOUS | Status: DC

## 2019-01-24 MED ORDER — POTASSIUM CL IN DEXTROSE 5% 20 MEQ/L IV SOLN: 20.0000 meq | INTRAVENOUS | Status: DC

## 2019-01-24 MED ORDER — DEXAMETHASONE 6 MG PO TABS: 6.0000 mg | ORAL_TABLET | Freq: Every day | ORAL | Status: DC

## 2019-01-24 MED ORDER — DEXAMETHASONE 4 MG PO TABS
6.0000 mg | ORAL_TABLET | Freq: Every day | ORAL | Status: DC
  Administered 2019-01-24: 6 mg via ORAL
  Filled 2019-01-24 (×2): qty 1.5

## 2019-01-24 MED ORDER — POTASSIUM CL IN DEXTROSE 5% 20 MEQ/L IV SOLN
20.0000 meq | INTRAVENOUS | Status: DC
  Administered 2019-01-24 (×2): 20 meq via INTRAVENOUS
  Filled 2019-01-24 (×4): qty 1000

## 2019-01-24 MED ORDER — VANCOMYCIN HCL IN DEXTROSE 750-5 MG/150ML-% IV SOLN
750.0000 mg | INTRAVENOUS | Status: DC
  Filled 2019-01-24: qty 150

## 2019-01-24 MED ORDER — INSULIN ASPART 100 UNIT/ML ~~LOC~~ SOLN
0.0000 [IU] | Freq: Every day | SUBCUTANEOUS | Status: DC
  Administered 2019-01-24: 3 [IU] via SUBCUTANEOUS
  Filled 2019-01-24: qty 1

## 2019-01-24 NOTE — Progress Notes (Signed)
Received notification from [facility] that pt had a 1 minute sustained run of SVT at 190 bpm occurring at 1359-1400.  Pt resting with NAD. Dr. Verdell Carmine informed, and no new orders received.

## 2019-01-24 NOTE — Plan of Care (Signed)
Pt speaking more with staff; communicating and following simple commands.     Problem: Education: Goal: Knowledge of General Education information will improve Description: Including pain rating scale, medication(s)/side effects and non-pharmacologic comfort measures Outcome: Progressing   Problem: Health Behavior/Discharge Planning: Goal: Ability to manage health-related needs will improve Outcome: Progressing   Problem: Clinical Measurements: Goal: Ability to maintain clinical measurements within normal limits will improve Outcome: Progressing Goal: Will remain free from infection Outcome: Progressing Goal: Diagnostic test results will improve Outcome: Progressing Goal: Respiratory complications will improve Outcome: Progressing Goal: Cardiovascular complication will be avoided Outcome: Progressing   Problem: Activity: Goal: Risk for activity intolerance will decrease Outcome: Progressing   Problem: Nutrition: Goal: Adequate nutrition will be maintained Outcome: Progressing   Problem: Coping: Goal: Level of anxiety will decrease Outcome: Progressing   Problem: Elimination: Goal: Will not experience complications related to bowel motility Outcome: Progressing Goal: Will not experience complications related to urinary retention Outcome: Progressing   Problem: Pain Managment: Goal: General experience of comfort will improve Outcome: Progressing   Problem: Safety: Goal: Ability to remain free from injury will improve Outcome: Progressing   Problem: Skin Integrity: Goal: Risk for impaired skin integrity will decrease Outcome: Progressing   Problem: Fluid Volume: Goal: Hemodynamic stability will improve Outcome: Progressing   Problem: Clinical Measurements: Goal: Diagnostic test results will improve Outcome: Progressing Goal: Signs and symptoms of infection will decrease Outcome: Progressing   Problem: Respiratory: Goal: Ability to maintain adequate  ventilation will improve Outcome: Progressing

## 2019-01-24 NOTE — Progress Notes (Signed)
[place] [facility] [place] NAME: Lisa Hahn    MR#:  [id number]  DATE OF BIRTH:  10-13-1940  SUBJECTIVE:   Patient presented to the hospital secondary to shortness of breath and cough and also noted to have UTI.  Patient's COVID-19 test is positive.  She is from [place] care center.  Patient also noted to be severely hypernatremic this morning and currently is somewhat lethargic and encephalopathic.  REVIEW OF SYSTEMS:    Review of Systems  Unable to perform ROS: Mental acuity    Nutrition: Heart Healthy/Carb modified Tolerating Diet: Yes Tolerating PT: bedbound at baseline.    DRUG ALLERGIES:   Allergies  Allergen Reactions  . Aspirin Nausea And Vomiting    Can tolerate baby aspirin  . Motrin [Ibuprofen] Other (See Comments)    Reaction: Burns stomach    VITALS:  Blood pressure 114/69, pulse 91, temperature 98.5 F (36.9 C), temperature source Oral, resp. rate 16, height 5\' 5"  (1.651 m), weight 53.7 kg, SpO2 99 %.  PHYSICAL EXAMINATION:   Physical Exam  GENERAL:  78 y.o.-year-old patient lying in bed lethargic/encephalopathic EYES: Pupils equal, round, reactive to light and accommodation. No scleral icterus. Extraocular muscles intact.  HEENT: Head atraumatic, normocephalic. Oropharynx and nasopharynx clear.  NECK:  Supple, no jugular venous distention. No thyroid enlargement, no tenderness.  LUNGS: Poor, Resp. Effort, no wheezing, rales, rhonchi. No use of accessory muscles of respiration.  CARDIOVASCULAR: S1, S2 normal. No murmurs, rubs, or gallops.  ABDOMEN: Soft, nontender, nondistended. Bowel sounds present. No organomegaly or mass.  EXTREMITIES: No cyanosis, clubbing or edema b/l.    NEUROLOGIC: Cranial nerves II through XII are intact. No focal Motor or sensory deficits b/l. Globally weak.   PSYCHIATRIC: The patient is alert and oriented x 1.  SKIN: No obvious rash, lesion, or ulcer.    LABORATORY PANEL:    CBC Recent Labs  Lab 01/24/19 0538  WBC 12.3*  HGB 13.0  HCT 42.9  PLT 194   ------------------------------------------------------------------------------------------------------------------  Chemistries  Recent Labs  Lab 01/23/19 0915 01/24/19 0538  NA 155* 160*  K 4.2 3.0*  CL 113* 120*  CO2 28 30  GLUCOSE 195* 204*  BUN 50* 35*  CREATININE 0.77 0.52  CALCIUM 9.7 8.9  AST 32  --   ALT 16  --   ALKPHOS 37*  --   BILITOT 1.6*  --    ------------------------------------------------------------------------------------------------------------------  Cardiac Enzymes No results for input(s): TROPONINI in the last 168 hours. ------------------------------------------------------------------------------------------------------------------  RADIOLOGY:  Dg Chest Port 1 View  Result Date: 01/23/2019 CLINICAL DATA:  Shortness of breath EXAM: PORTABLE CHEST 1 VIEW COMPARISON:  01/27/2017 FINDINGS: Mild patchy opacity in the lingula and bilateral lower lobes. No pleural effusion or pneumothorax. The heart is normal in size. IMPRESSION: Mild patchy opacity in the lingula and bilateral lower lobes, worrisome for multifocal pneumonia. Electronically Signed   By: Julian Hy M.D.   On: 01/23/2019 10:25     ASSESSMENT AND PLAN:   78 year old female with past medical history of previous CVA, dementia, diabetes, hypertension, osteoarthritis who presented to the hospital due to shortness of breath and cough and noted to have chest x-ray findings suggestive of multifocal pneumonia.  Patient's [id number] positive.  1.  COVID-19 pneumonia-patient presented to the hospital with shortness of breath and cough.  She is on minimal oxygen at 2 L. -Continue O2 supplementation, patient started on remdesivir, Decadron. -Await transfer to [facility].  2.  Altered mental status-metabolic encephalopathy secondary to dehydration/hypernatremia, and also urinary tract infection.  -Continue IV fluids, IV antibiotics for UTI.  Continue to follow mental status.  3.  Hypernatremia-secondary to dehydration. -Continue D5W and will follow sodium.  4.  Hypokalemia-we will also continue supplement and repeat level in the morning.  Check magnesium level.    5.  Diabetes type 2 without complication-continue Tradjenta, sliding scale insulin.  Follow blood sugars.  6.  Hyperlipidemia-continue atorvastatin.  Updated pt's son over the phone about pt's plan of care.   All the records are reviewed and case discussed with Care Management/Social Worker. Management plans discussed with the patient, family and they are in agreement.  CODE STATUS: Full code  DVT Prophylaxis: Lovenox  TOTAL TIME TAKING CARE OF THIS PATIENT: 30 minutes.   POSSIBLE transfer to [facility] in 1-2 days.    Henreitta Leber M.D on 01/24/2019 at 1:37 PM  Between 7am to 6pm - Pager - [phone]  After 6pm go to [email or web address] - Proofreader  [facility]  Office  [phone]  CC: Primary care physician; Marco Collie, MD

## 2019-01-24 NOTE — TOC Initial Note (Addendum)
Transition of Care [facility]) - Initial/Assessment Note    Patient Details  Name: Lisa Hahn MRN: [id number] Date of Birth: 06/14/1940  Transition of Care [facility]) CM/SW Contact:    Elza Rafter, RN Phone Number: 01/24/2019, 12:20 PM  Clinical Narrative:   Admitted after falling at home while helping her COVID (+) husband to bathroom.  She is also COVID (+).  Will have Kyphoplasty for fractured T-11 today.  Patient took and extra lisinopril at home due to having a headache.    From [place] resident.  Spoke with Claiborne Billings at [facility] and she states patient can return once medically ready.  FL2 complete.  Initial SNF request sent to [facility] in [place].  @1350 -Order placed by MD to transfer to [facility].       Patient Goals and CMS Choice        Expected Discharge Plan and Services                                                Prior Living Arrangements/Services                       Activities of Daily Living Home Assistive Devices/Equipment: Built-in shower seat, Bedside commode/3-in-1, Grab bars around toilet, Grab bars in shower ADL Screening (condition at time of admission) Patient's cognitive ability adequate to safely complete daily activities?: No Is the patient deaf or have difficulty hearing?: No Does the patient have difficulty seeing, even when wearing glasses/contacts?: Yes Does the patient have difficulty concentrating, remembering, or making decisions?: Yes Patient able to express need for assistance with ADLs?: No Does the patient have difficulty dressing or bathing?: Yes Independently performs ADLs?: No Communication: Dependent Is this a change from baseline?: Pre-admission baseline Dressing (OT): Dependent Is this a change from baseline?: Pre-admission baseline Grooming: Dependent Is this a change from baseline?: Pre-admission baseline Feeding: Dependent Is this a change from baseline?: Pre-admission baseline Bathing:  Dependent Is this a change from baseline?: Pre-admission baseline Toileting: Dependent Is this a change from baseline?: Pre-admission baseline In/Out Bed: Dependent Is this a change from baseline?: Pre-admission baseline Walks in Home: Dependent Is this a change from baseline?: Pre-admission baseline Does the patient have difficulty walking or climbing stairs?: Yes Weakness of Legs: Both Weakness of Arms/Hands: Both  Permission Sought/Granted                  Emotional Assessment              Admission diagnosis:  Dehydration [E86.0] Hypernatremia [E87.0] Urinary tract infection in elderly patient [N39.0] Patient Active Problem List   Diagnosis Date Noted  . Pneumonia 01/23/2019  . UTI (urinary tract infection) 02/03/2017  . Syncope 01/27/2017  . Decreased pedal pulses 08/22/2016  . Hyperlipidemia 08/22/2016  . Poor dentition 04/16/2016  . Abdominal pain 04/16/2016  . Onychomycosis 04/16/2016  . Lightheadedness 08/15/2015  . Abdominal pain, chronic, epigastric 08/15/2015  . Dementia ([place]) 07/25/2015  . Chest pain 06/28/2015  . Strain of left quadriceps tendon 06/28/2015  . Type 2 diabetes mellitus ([place]) 06/28/2015  . Vision changes 06/28/2015   PCP:  Marco Collie, MD Pharmacy:   [id number] [address], [place] [address] [place] [address] Phone: [phone] Fax: [phone]     Social Determinants of Health ([place])  Interventions    Readmission Risk Interventions No flowsheet data found.

## 2019-01-24 NOTE — Progress Notes (Signed)
Pharmacy Antibiotic Note  Lisa Hahn is a 78 y.o. female admitted on 01/23/2019 with pneumonia and sepsis.  Pharmacy has been consulted for cefepime and vancomycin dosing.  Plan: Cefepime 2 g IV q12h  Vancomycin 1500 mg IV x1 loading dose Vancomycin 1000 mg IV Q 24 hrs adjusted to 750 mg q24H. Goal AUC 400-550. Used TBW.  Expected AUC: 429 Expected Cmin 10.4 SCr used: 0.8  MRSA PCR negative - recommend to d/c vancomycin and continue cefepime.  SCr in AM to continue to monitor renal function and dosing.    Height: 5\' 5"  (165.1 cm) Weight: 118 lb 6.4 oz (53.7 kg) IBW/kg (Calculated) : 57  Temp (24hrs), Avg:98.3 F (36.8 C), Min:98 F (36.7 C), Max:98.5 F (36.9 C)  Recent Labs  Lab 01/23/19 0915 01/23/19 1554 01/24/19 0538  WBC 10.8*  --  12.3*  CREATININE 0.77  --  0.52  LATICACIDVEN 2.2* 1.9  --     Estimated Creatinine Clearance: 49.1 mL/min (by C-G formula based on SCr of 0.52 mg/dL).    Allergies  Allergen Reactions  . Aspirin Nausea And Vomiting    Can tolerate baby aspirin  . Motrin [Ibuprofen] Other (See Comments)    Reaction: Burns stomach    Antimicrobials this admission: Ceftriaxone/azithromycin x1 in ED 10/18 Cefepime 10/18>> Vancomycin 10/18>>  Dose adjustments this admission:   Microbiology results:  BCx: sent  MRSA PCR: ordered.   Thank you for allowing pharmacy to be a part of this patient's care.  Oswald Hillock, PharmD, BCPS 01/24/2019 12:12 PM

## 2019-01-24 NOTE — Discharge Summary (Signed)
[place] at [place] NAME: Lisa Hahn    MR#:  [id number]  DATE OF BIRTH:  February 19, 1941  DATE OF ADMISSION:  01/23/2019 ADMITTING PHYSICIAN: Saundra Shelling, MD  DATE OF DISCHARGE: 01/24/2019  PRIMARY CARE PHYSICIAN: Marco Collie, MD    ADMISSION DIAGNOSIS:  Dehydration [E86.0] Hypernatremia [E87.0] Urinary tract infection in elderly patient [N39.0]  DISCHARGE DIAGNOSIS:  Active Problems:   Pneumonia   SECONDARY DIAGNOSIS:   Past Medical History:  Diagnosis Date  . Arthritis   . Dementia ([place])   . Diabetes mellitus without complication ([place])   . Hypertension   . Stroke [facility])     HOSPITAL COURSE:   78 year old female with past medical history of previous CVA, dementia, diabetes, hypertension, osteoarthritis who presented to the hospital due to shortness of breath and cough and noted to have chest x-ray findings suggestive of multifocal pneumonia.  Patient's [id number] positive.  1.  COVID-19 pneumonia-patient presented to the hospital with shortness of breath and cough.  She is on minimal oxygen at 2 L. -Continue O2 supplementation, patient started on remdesivir, Decadron. -Await transfer to [facility].  2.  Altered mental status-metabolic encephalopathy secondary to dehydration/hypernatremia, and also urinary tract infection. -Continue IV fluids, IV antibiotics for UTI.  Continue to follow mental status.  3.  Hypernatremia-secondary to dehydration. -Continue D5W and will follow sodium.  4.  Hypokalemia-we will also continue supplement and repeat level in the morning.  Check magnesium level.    5.  Diabetes type 2 without complication-continue Tradjenta, sliding scale insulin.  Follow blood sugars.  6.  Hyperlipidemia-continue atorvastatin.  DISCHARGE CONDITIONS:   Stable.   CONSULTS OBTAINED:    DRUG ALLERGIES:   Allergies  Allergen Reactions  . Aspirin Nausea And Vomiting    Can tolerate baby  aspirin  . Motrin [Ibuprofen] Other (See Comments)    Reaction: Burns stomach    DISCHARGE MEDICATIONS:   Allergies as of 01/24/2019      Reactions   Aspirin Nausea And Vomiting   Can tolerate baby aspirin   Motrin [ibuprofen] Other (See Comments)   Reaction: Burns stomach      Medication List    TAKE these medications   acetaminophen 325 MG tablet Commonly known as: TYLENOL Take 650 mg by mouth every 6 (six) hours as needed.   aspirin EC 81 MG tablet Take 81 mg by mouth daily.   atorvastatin 40 MG tablet Commonly known as: LIPITOR Take 40 mg by mouth daily.   ceFEPIme 2 g in sodium chloride 0.9 % 100 mL Inject 2 g into the vein every 12 (twelve) hours.   cholecalciferol 25 MCG (1000 UT) tablet Commonly known as: VITAMIN D3 Take 1,000 Units by mouth daily.   dexamethasone 6 MG tablet Commonly known as: DECADRON Take 1 tablet (6 mg total) by mouth daily. Start taking on: January 25, 2019   dextrose 5 % with KCl 20 mEq / L 20-5 MEQ/L-% Inject 1,000 mLs (20 mEq total) into the vein continuous.   divalproex 125 MG capsule Commonly known as: DEPAKOTE SPRINKLE Take 250 mg by mouth 3 (three) times daily.   donepezil 5 MG tablet Commonly known as: ARICEPT Take 5 mg by mouth at bedtime.   enoxaparin 40 MG/0.4ML injection Commonly known as: LOVENOX Inject 0.4 mLs (40 mg total) into the skin daily.   guaiFENesin 100 MG/5ML liquid Commonly known as: ROBITUSSIN Take 200 mg by mouth 3 (three) times daily as needed for cough.  losartan 50 MG tablet Commonly known as: COZAAR Take 50 mg by mouth daily.   Melatonin 10 MG Tabs Take 10 mg by mouth at bedtime.   metFORMIN 1000 MG tablet Commonly known as: GLUCOPHAGE Take 1,000 mg by mouth 2 (two) times daily with a meal.   Namenda 5 MG tablet Generic drug: memantine Take 5 mg by mouth 2 (two) times daily.   potassium chloride 10 MEQ tablet Commonly known as: KLOR-CON Take 10 mEq by mouth daily.   sitaGLIPtin  100 MG tablet Commonly known as: JANUVIA Take 100 mg by mouth daily.   traMADol 50 MG tablet Commonly known as: ULTRAM Take 1 tablet (50 mg total) by mouth every 6 (six) hours as needed.   traZODone 50 MG tablet Commonly known as: DESYREL Take 50 mg by mouth at bedtime.         DISCHARGE INSTRUCTIONS:   DIET:  Soft diet  DISCHARGE CONDITION:  Stable  ACTIVITY:  Activity as tolerated  OXYGEN:  Home Oxygen: Yes.     Oxygen Delivery: 2 liters/min via Patient connected to nasal cannula oxygen  DISCHARGE LOCATION:  [facility].    If you experience worsening of your admission symptoms, develop shortness of breath, life threatening emergency, suicidal or homicidal thoughts you must seek medical attention immediately by calling 911 or calling your MD immediately  if symptoms less severe.  You Must read complete instructions/literature along with all the possible adverse reactions/side effects for all the Medicines you take and that have been prescribed to you. Take any new Medicines after you have completely understood and accpet all the possible adverse reactions/side effects.   Please note  You were cared for by a hospitalist during your hospital stay. If you have any questions about your discharge medications or the care you received while you were in the hospital after you are discharged, you can call the unit and asked to speak with the hospitalist on call if the hospitalist that took care of you is not available. Once you are discharged, your primary care physician will handle any further medical issues. Please note that NO REFILLS for any discharge medications will be authorized once you are discharged, as it is imperative that you return to your primary care physician (or establish a relationship with a primary care physician if you do not have one) for your aftercare needs so that they can reassess your need for medications and monitor your lab  values.    DATA REVIEW:   CBC Recent Labs  Lab 01/24/19 0538  WBC 12.3*  HGB 13.0  HCT 42.9  PLT 194    Chemistries  Recent Labs  Lab 01/23/19 0915 01/24/19 0538  NA 155* 160*  K 4.2 3.0*  CL 113* 120*  CO2 28 30  GLUCOSE 195* 204*  BUN 50* 35*  CREATININE 0.77 0.52  CALCIUM 9.7 8.9  AST 32  --   ALT 16  --   ALKPHOS 37*  --   BILITOT 1.6*  --     Cardiac Enzymes No results for input(s): TROPONINI in the last 168 hours.  Microbiology Results  Results for orders placed or performed during the hospital encounter of 01/23/19  SARS CORONAVIRUS 2 (TAT 6-24 HRS) Nasopharyngeal Nasopharyngeal Swab     Status: Abnormal   Collection Time: 01/23/19  9:15 AM   Specimen: Nasopharyngeal Swab  Result Value Ref Range Status   SARS Coronavirus 2 POSITIVE (A) NEGATIVE Final    Comment: RESULT CALLED TO,  READ BACK BY AND VERIFIED WITH: DARLENE LISTOPAD RN.@1800  ON 10.18.2020 BY TCALDWELL MT. (NOTE) SARS-CoV-2 target nucleic acids are DETECTED. The SARS-CoV-2 RNA is generally detectable in upper and lower respiratory specimens during the acute phase of infection. Positive results are indicative of active infection with SARS-CoV-2. Clinical  correlation with patient history and other diagnostic information is necessary to determine patient infection status. Positive results do  not rule out bacterial infection or co-infection with other viruses. The expected result is Negative. Fact Sheet for Patients: SugarRoll.be Fact Sheet for Healthcare Providers: https://www.woods-mathews.com/ This test is not yet approved or cleared by the [place] FDA and  has been authorized for detection and/or diagnosis of SARS-CoV-2 by FDA under an Emergency Use Authorization (EUA). This EUA will remain  in effect (meaning this te st can be used) for the duration of the COVID-19 declaration under Section 564(b)(1) of the Act, 21 U.S.C. section  360bbb-3(b)(1), unless the authorization is terminated or revoked sooner. Performed at [facility], [place] [address] [place], [place] [address]   Culture, blood (routine x 2)     Status: None (Preliminary result)   Collection Time: 01/23/19 11:38 AM   Specimen: BLOOD  Result Value Ref Range Status   Specimen Description BLOOD LEFT ANTECUBITAL  Final   Special Requests   Final    BOTTLES DRAWN AEROBIC AND ANAEROBIC Blood Culture adequate volume   Culture   Final    NO GROWTH < 24 HOURS Performed at [facility], [address], [place], [place] [address]    Report Status PENDING  Incomplete  Culture, blood (routine x 2)     Status: None (Preliminary result)   Collection Time: 01/23/19 11:38 AM   Specimen: BLOOD  Result Value Ref Range Status   Specimen Description BLOOD RIGHT ANTECUBITAL  Final   Special Requests   Final    BOTTLES DRAWN AEROBIC AND ANAEROBIC Blood Culture adequate volume   Culture   Final    NO GROWTH < 24 HOURS Performed at [facility], [address], [place], [place] [address]    Report Status PENDING  Incomplete  MRSA PCR Screening     Status: None   Collection Time: 01/23/19  3:54 PM   Specimen: Nasal Mucosa; Nasopharyngeal  Result Value Ref Range Status   MRSA by PCR NEGATIVE NEGATIVE Final    Comment:        The GeneXpert MRSA Assay (FDA approved for NASAL specimens only), is one component of a comprehensive MRSA colonization surveillance program. It is not intended to diagnose MRSA infection nor to guide or monitor treatment for MRSA infections. Performed at [facility], [address] [place], [place] [address]     RADIOLOGY:  Dg Chest Port 1 View  Result Date: 01/23/2019 CLINICAL DATA:  Shortness of breath EXAM: PORTABLE CHEST 1 VIEW COMPARISON:  01/27/2017 FINDINGS: Mild patchy opacity in the lingula and bilateral lower lobes. No pleural effusion or pneumothorax. The heart is normal in size.  IMPRESSION: Mild patchy opacity in the lingula and bilateral lower lobes, worrisome for multifocal pneumonia. Electronically Signed   By: Julian Hy M.D.   On: 01/23/2019 10:25      Management plans discussed with the patient, family and they are in agreement.  CODE STATUS:     Code Status Orders  (From admission, onward)         Start     Ordered   01/23/19 1655  Full code  Continuous  01/23/19 1655        TOTAL TIME TAKING CARE OF THIS PATIENT: 40 minutes.    Henreitta Leber M.D on 01/24/2019 at 3:07 PM  Between 7am to 6pm - Pager - [phone]  After 6pm go to [email or web address] - Proofreader  [facility]  Office  [phone]  CC: Primary care physician; Marco Collie, MD

## 2019-01-24 NOTE — Progress Notes (Signed)
Initial Nutrition Assessment  DOCUMENTATION CODES:   Not applicable  INTERVENTION:  Recommend Ensure Enlive po BID, each supplement provides 350 kcal and 20 grams of protein. Unable to place order for supplement as patient now with discharge order and medication reconciliation is already complete.  NUTRITION DIAGNOSIS:   Increased nutrient needs related to catabolic [id number] as evidenced by estimated needs.  GOAL:   Patient will meet greater than or equal to 90% of their needs  MONITOR:   PO intake, Supplement acceptance, Labs, Weight trends, I & O's  REASON FOR ASSESSMENT:   Malnutrition Screening Tool    ASSESSMENT:   78 year old female with PMHx of dementia, HTN, DM, arthritis, hx CVA admitted with COVID-19, PNA, AMS, hypernatremia.   Patient is on air/contact precautions. Attempted to call patient over the phone but she was unable to answer. No meal completion documented at this time so unable to tell how patient is eating. She has increased nutrient needs and will benefit from oral nutrition supplements.  According to weight history in chart patient was 74.6 kg on 12/14/2017, 64.8 kg on 10/22/2018, and is now 53.7 kg (118.4 lbs). If current weight is accurate she has lost 20.9 kg (28% body weight) over approximately one year which is significant for time frame.  Medications reviewed and include: vitamin D3 1000 units daily, Decadron 6 mg daily, Novolog 0-9 units TID, Novolog 0-5 units QHS, cefepime, D5 with KCl 20 mEq/L at 100 mL/hr, remdesivir.  Labs reviewed: CBG 131-196, Sodium 160, Potassium 3, Chloride 120, BUN 35.  Patient is at risk for malnutrition. Unable to determine if she meets criteria without full nutrition/weight history or NFPE.  NUTRITION - FOCUSED PHYSICAL EXAM:  Unable to complete at this time.  Diet Order:   Diet Order            Diet - low sodium heart healthy        Diet heart healthy/carb modified Room service appropriate? Yes; Fluid  consistency: Thin  Diet effective now             EDUCATION NEEDS:   No education needs have been identified at this time  Skin:  Skin Assessment: Reviewed RN Assessment  Last BM:  01/23/2019 per chart  Height:   Ht Readings from Last 1 Encounters:  01/23/19 5\' 5"  (1.651 m)   Weight:   Wt Readings from Last 1 Encounters:  01/23/19 53.7 kg   Ideal Body Weight:  56.8 kg  BMI:  Body mass index is 19.7 kg/m.  Estimated Nutritional Needs:   Kcal:  1500-1700  Protein:  75-85 grams  Fluid:  1.5-1.7 L/day  Willey Blade, MS, RD, LDN Office: [phone] Pager: [phone] After Hours/Weekend Pager: [phone]

## 2019-01-24 NOTE — NC FL2 (Signed)
[place] LEVEL OF CARE SCREENING TOOL     IDENTIFICATION  Patient Name: Lisa Hahn Birthdate: [birth year] Sex: female Admission Date (Current Location): 01/23/2019  [place] and [place] Number:  Engineering geologist and Address:  [facility], [address] [place], [place] [address]      Provider Number: [id number]  Attending Physician Name and Address:  Henreitta Leber, MD  Relative Name and Phone Number:  husband is inpatient in room 249    Current Level of Care: Hospital Recommended Level of Care: [place] Prior Approval Number:    Date Approved/Denied:   PASRR Number:    Discharge Plan: SNF    Current Diagnoses: Patient Active Problem List   Diagnosis Date Noted  . Pneumonia 01/23/2019  . UTI (urinary tract infection) 02/03/2017  . Syncope 01/27/2017  . Decreased pedal pulses 08/22/2016  . Hyperlipidemia 08/22/2016  . Poor dentition 04/16/2016  . Abdominal pain 04/16/2016  . Onychomycosis 04/16/2016  . Lightheadedness 08/15/2015  . Abdominal pain, chronic, epigastric 08/15/2015  . Dementia ([place]) 07/25/2015  . Chest pain 06/28/2015  . Strain of left quadriceps tendon 06/28/2015  . Type 2 diabetes mellitus ([place]) 06/28/2015  . Vision changes 06/28/2015    Orientation RESPIRATION BLADDER Height & Weight     Self, Time, Situation, Place  O2(2L) Incontinent Weight: 53.7 kg Height:  5\' 5"  (165.1 cm)  BEHAVIORAL SYMPTOMS/MOOD NEUROLOGICAL BOWEL NUTRITION STATUS      Continent Diet(Heart healthy carb modified)  AMBULATORY STATUS COMMUNICATION OF NEEDS Skin   Limited Assist Verbally Normal                       Personal Care Assistance Level of Assistance  Bathing Bathing Assistance: Limited assistance         Functional Limitations Info  Sight, Hearing, Speech Sight Info: Adequate Hearing Info: Adequate Speech Info: Adequate    SPECIAL CARE FACTORS FREQUENCY  PT (By licensed PT), OT  (By licensed OT)     PT Frequency: 5 X a week OT Frequency: 5 X a week            Contractures Contractures Info: Not present    Additional Factors Info  Code Status, Allergies Code Status Info: Full Allergies Info: Aspirin, Motrin Ibuprofen           Current Medications (01/24/2019):  This is the current hospital active medication list Current Facility-Administered Medications  Medication Dose Route Frequency Provider Last Rate Last Dose  . acetaminophen (TYLENOL) tablet 650 mg  650 mg Oral Q6H PRN Saundra Shelling, MD       Or  . acetaminophen (TYLENOL) suppository 650 mg  650 mg Rectal Q6H PRN Pyreddy, Reatha Harps, MD      . aspirin EC tablet 81 mg  81 mg Oral Daily Pyreddy, Reatha Harps, MD   81 mg at 01/24/19 0918  . atorvastatin (LIPITOR) tablet 40 mg  40 mg Oral Daily Pyreddy, Reatha Harps, MD   40 mg at 01/24/19 0918  . ceFEPIme (MAXIPIME) 2 g in sodium chloride 0.9 % 100 mL IVPB  2 g Intravenous Q12H Rocky Morel, RPH 200 mL/hr at 01/24/19 0444 2 g at 01/24/19 0444  . cholecalciferol (VITAMIN D3) tablet 1,000 Units  1,000 Units Oral Daily Saundra Shelling, MD   1,000 Units at 01/24/19 0915  . dexamethasone (DECADRON) tablet 6 mg  6 mg Oral Daily Simonne Maffucci B, MD   6 mg at 01/24/19 0919  .  dextrose 5 % with KCl 20 mEq / L  infusion  20 mEq Intravenous Continuous Henreitta Leber, MD 100 mL/hr at 01/24/19 1000 20 mEq at 01/24/19 1000  . enoxaparin (LOVENOX) injection 40 mg  40 mg Subcutaneous Q24H Saundra Shelling, MD   40 mg at 01/23/19 2048  . insulin aspart (novoLOG) injection 0-5 Units  0-5 Units Subcutaneous QHS Sainani, Vivek J, MD      . insulin aspart (novoLOG) injection 0-9 Units  0-9 Units Subcutaneous TID WC Henreitta Leber, MD   2 Units at 01/24/19 1201  . linagliptin (TRADJENTA) tablet 5 mg  5 mg Oral Daily Pyreddy, Reatha Harps, MD   5 mg at 01/24/19 0918  . metFORMIN (GLUCOPHAGE) tablet 1,000 mg  1,000 mg Oral BID WC Pyreddy, Reatha Harps, MD   1,000 mg at 01/24/19 0918  . ondansetron  (ZOFRAN) tablet 4 mg  4 mg Oral Q6H PRN Pyreddy, Reatha Harps, MD       Or  . ondansetron (ZOFRAN) injection 4 mg  4 mg Intravenous Q6H PRN Pyreddy, Reatha Harps, MD      . Derrill Memo ON 01/25/2019] remdesivir 100 mg in sodium chloride 0.9 % 250 mL IVPB  100 mg Intravenous Q24H Sainani, Belia Heman, MD      . senna-docusate (Senokot-S) tablet 1 tablet  1 tablet Oral QHS PRN Saundra Shelling, MD      . traMADol Veatrice Bourbon) tablet 50 mg  50 mg Oral Q6H PRN Saundra Shelling, MD   50 mg at 01/24/19 0918  . vancomycin (VANCOCIN) IVPB 750 mg/150 ml premix  750 mg Intravenous Q24H Henreitta Leber, MD         Discharge Medications: Please see discharge summary for a list of discharge medications.  Relevant Imaging Results:  Relevant Lab Results:   Additional Information [id number]  Elza Rafter, RN

## 2019-01-25 ENCOUNTER — Inpatient Hospital Stay (HOSPITAL_COMMUNITY)
Admission: AD | Admit: 2019-01-25 | Discharge: 2019-02-02 | DRG: 177 | Disposition: A | Discharge status: Hospice | Payer: Medicare Other | Source: Skilled Nursing Facility | Attending: Internal Medicine | Admitting: Internal Medicine

## 2019-01-25 ENCOUNTER — Encounter (HOSPITAL_COMMUNITY): Payer: Self-pay

## 2019-01-25 ENCOUNTER — Other Ambulatory Visit: Payer: Self-pay

## 2019-01-25 DIAGNOSIS — F0390 Unspecified dementia without behavioral disturbance: Secondary | ICD-10-CM | POA: Diagnosis present

## 2019-01-25 DIAGNOSIS — J1289 Other viral pneumonia: Secondary | ICD-10-CM | POA: Diagnosis present

## 2019-01-25 DIAGNOSIS — L89152 Pressure ulcer of sacral region, stage 2: Secondary | ICD-10-CM | POA: Diagnosis not present

## 2019-01-25 DIAGNOSIS — I472 Ventricular tachycardia: Secondary | ICD-10-CM | POA: Diagnosis present

## 2019-01-25 DIAGNOSIS — G8929 Other chronic pain: Secondary | ICD-10-CM | POA: Diagnosis present

## 2019-01-25 DIAGNOSIS — Z7401 Bed confinement status: Secondary | ICD-10-CM | POA: Diagnosis not present

## 2019-01-25 DIAGNOSIS — N390 Urinary tract infection, site not specified: Secondary | ICD-10-CM | POA: Diagnosis present

## 2019-01-25 DIAGNOSIS — E870 Hyperosmolality and hypernatremia: Secondary | ICD-10-CM | POA: Diagnosis not present

## 2019-01-25 DIAGNOSIS — E860 Dehydration: Secondary | ICD-10-CM | POA: Diagnosis present

## 2019-01-25 DIAGNOSIS — Z90710 Acquired absence of both cervix and uterus: Secondary | ICD-10-CM

## 2019-01-25 DIAGNOSIS — Z87891 Personal history of nicotine dependence: Secondary | ICD-10-CM | POA: Diagnosis not present

## 2019-01-25 DIAGNOSIS — J9611 Chronic respiratory failure with hypoxia: Secondary | ICD-10-CM | POA: Diagnosis not present

## 2019-01-25 DIAGNOSIS — E785 Hyperlipidemia, unspecified: Secondary | ICD-10-CM | POA: Diagnosis not present

## 2019-01-25 DIAGNOSIS — M2550 Pain in unspecified joint: Secondary | ICD-10-CM | POA: Diagnosis not present

## 2019-01-25 DIAGNOSIS — E871 Hypo-osmolality and hyponatremia: Secondary | ICD-10-CM | POA: Diagnosis present

## 2019-01-25 DIAGNOSIS — Z8673 Personal history of transient ischemic attack (TIA), and cerebral infarction without residual deficits: Secondary | ICD-10-CM

## 2019-01-25 DIAGNOSIS — Z7189 Other specified counseling: Secondary | ICD-10-CM | POA: Diagnosis not present

## 2019-01-25 DIAGNOSIS — Z452 Encounter for adjustment and management of vascular access device: Secondary | ICD-10-CM | POA: Diagnosis not present

## 2019-01-25 DIAGNOSIS — I471 Supraventricular tachycardia: Secondary | ICD-10-CM | POA: Diagnosis present

## 2019-01-25 DIAGNOSIS — Z515 Encounter for palliative care: Secondary | ICD-10-CM | POA: Diagnosis not present

## 2019-01-25 DIAGNOSIS — U071 COVID-19: Secondary | ICD-10-CM | POA: Diagnosis present

## 2019-01-25 DIAGNOSIS — E876 Hypokalemia: Secondary | ICD-10-CM | POA: Diagnosis present

## 2019-01-25 DIAGNOSIS — R627 Adult failure to thrive: Secondary | ICD-10-CM | POA: Diagnosis present

## 2019-01-25 DIAGNOSIS — Z79890 Hormone replacement therapy (postmenopausal): Secondary | ICD-10-CM

## 2019-01-25 DIAGNOSIS — F0391 Unspecified dementia with behavioral disturbance: Secondary | ICD-10-CM | POA: Diagnosis not present

## 2019-01-25 DIAGNOSIS — Z9981 Dependence on supplemental oxygen: Secondary | ICD-10-CM | POA: Diagnosis not present

## 2019-01-25 DIAGNOSIS — Z79899 Other long term (current) drug therapy: Secondary | ICD-10-CM | POA: Diagnosis not present

## 2019-01-25 DIAGNOSIS — I10 Essential (primary) hypertension: Secondary | ICD-10-CM | POA: Diagnosis present

## 2019-01-25 DIAGNOSIS — E119 Type 2 diabetes mellitus without complications: Secondary | ICD-10-CM

## 2019-01-25 DIAGNOSIS — B948 Sequelae of other specified infectious and parasitic diseases: Secondary | ICD-10-CM | POA: Diagnosis not present

## 2019-01-25 DIAGNOSIS — J1282 Pneumonia due to coronavirus disease 2019: Secondary | ICD-10-CM | POA: Diagnosis present

## 2019-01-25 DIAGNOSIS — R1013 Epigastric pain: Secondary | ICD-10-CM | POA: Diagnosis present

## 2019-01-25 DIAGNOSIS — Z66 Do not resuscitate: Secondary | ICD-10-CM | POA: Diagnosis present

## 2019-01-25 DIAGNOSIS — J9601 Acute respiratory failure with hypoxia: Secondary | ICD-10-CM | POA: Diagnosis present

## 2019-01-25 DIAGNOSIS — Z7982 Long term (current) use of aspirin: Secondary | ICD-10-CM | POA: Diagnosis not present

## 2019-01-25 DIAGNOSIS — R5381 Other malaise: Secondary | ICD-10-CM | POA: Diagnosis not present

## 2019-01-25 DIAGNOSIS — Z436 Encounter for attention to other artificial openings of urinary tract: Secondary | ICD-10-CM | POA: Diagnosis not present

## 2019-01-25 DIAGNOSIS — I69391 Dysphagia following cerebral infarction: Secondary | ICD-10-CM | POA: Diagnosis not present

## 2019-01-25 LAB — CBC
HCT: 39.2 % (ref 36.0–46.0)
Hemoglobin: 12 g/dL (ref 12.0–15.0)
MCH: 28.3 pg (ref 26.0–34.0)
MCHC: 30.6 g/dL (ref 30.0–36.0)
MCV: 92.5 fL (ref 80.0–100.0)
Platelets: 171 10*3/uL (ref 150–400)
RBC: 4.24 MIL/uL (ref 3.87–5.11)
RDW: 16.1 % — ABNORMAL HIGH (ref 11.5–15.5)
WBC: 11.9 10*3/uL — ABNORMAL HIGH (ref 4.0–10.5)
nRBC: 0 % (ref 0.0–0.2)

## 2019-01-25 LAB — MAGNESIUM: Magnesium: 1.8 mg/dL (ref 1.7–2.4)

## 2019-01-25 LAB — D-DIMER, QUANTITATIVE: D-Dimer, Quant: 6.66 ug/mL-FEU — ABNORMAL HIGH (ref 0.00–0.50)

## 2019-01-25 LAB — COMPREHENSIVE METABOLIC PANEL
ALT: 17 U/L (ref 0–44)
AST: 31 U/L (ref 15–41)
Albumin: 2.6 g/dL — ABNORMAL LOW (ref 3.5–5.0)
Alkaline Phosphatase: 38 U/L (ref 38–126)
Anion gap: 10 (ref 5–15)
BUN: 29 mg/dL — ABNORMAL HIGH (ref 8–23)
CO2: 24 mmol/L (ref 22–32)
Calcium: 9 mg/dL (ref 8.9–10.3)
Chloride: 121 mmol/L — ABNORMAL HIGH (ref 98–111)
Creatinine, Ser: 0.64 mg/dL (ref 0.44–1.00)
GFR calc Af Amer: >60 mL/min (ref >60)
GFR calc non Af Amer: >60 mL/min (ref >60)
Glucose, Bld: 134 mg/dL — ABNORMAL HIGH (ref 70–99)
Potassium: 3.7 mmol/L (ref 3.5–5.1)
Sodium: 155 mmol/L — ABNORMAL HIGH (ref 135–145)
Total Bilirubin: 1.4 mg/dL — ABNORMAL HIGH (ref 0.3–1.2)
Total Protein: 6.8 g/dL (ref 6.5–8.1)

## 2019-01-25 LAB — HEMOGLOBIN A1C
Hgb A1c MFr Bld: 7.1 % — ABNORMAL HIGH (ref 4.8–5.6)
Mean Plasma Glucose: 157.07 mg/dL

## 2019-01-25 LAB — ABO/RH: ABO/RH(D): A NEG

## 2019-01-25 LAB — C-REACTIVE PROTEIN: CRP: 9.5 mg/dL — ABNORMAL HIGH (ref <1.0)

## 2019-01-25 LAB — GLUCOSE, CAPILLARY
Glucose-Capillary: 125 mg/dL — ABNORMAL HIGH (ref 70–99)
Glucose-Capillary: 108 mg/dL — ABNORMAL HIGH (ref 70–99)
Glucose-Capillary: 126 mg/dL — ABNORMAL HIGH (ref 70–99)
Glucose-Capillary: 271 mg/dL — ABNORMAL HIGH (ref 70–99)
Glucose-Capillary: 285 mg/dL — ABNORMAL HIGH (ref 70–99)

## 2019-01-25 MED ORDER — ONDANSETRON HCL 4 MG PO TABS: 4.0000 mg | ORAL_TABLET | Freq: Four times a day (QID) | ORAL | Status: DC | PRN

## 2019-01-25 MED ORDER — SODIUM CHLORIDE 0.9 % IV SOLN
INTRAVENOUS | Status: DC
  Administered 2019-01-25 (×2): via INTRAVENOUS

## 2019-01-25 MED ORDER — SODIUM CHLORIDE 0.9% FLUSH
3.0000 mL | Freq: Two times a day (BID) | INTRAVENOUS | Status: DC
  Administered 2019-01-25 – 2019-02-01 (×13): 3 mL via INTRAVENOUS

## 2019-01-25 MED ORDER — ENOXAPARIN SODIUM 300 MG/3ML IJ SOLN
0.5000 mg/kg | INTRAMUSCULAR | Status: DC
  Filled 2019-01-25: qty 0.3

## 2019-01-25 MED ORDER — ONDANSETRON HCL 4 MG/2ML IJ SOLN: 4.0000 mg | Freq: Four times a day (QID) | INTRAMUSCULAR | Status: DC | PRN

## 2019-01-25 MED ORDER — POTASSIUM CHLORIDE CRYS ER 20 MEQ PO TBCR
30.0000 meq | EXTENDED_RELEASE_TABLET | Freq: Once | ORAL | Status: AC
  Administered 2019-01-25: 30 meq via ORAL
  Filled 2019-01-25: qty 2

## 2019-01-25 MED ORDER — SODIUM CHLORIDE 0.9 % IV SOLN: 250.0000 mL | INTRAVENOUS | Status: DC | PRN

## 2019-01-25 MED ORDER — GUAIFENESIN-DM 100-10 MG/5ML PO SYRP: 10.0000 mL | ORAL_SOLUTION | ORAL | Status: DC | PRN

## 2019-01-25 MED ORDER — ASPIRIN EC 81 MG PO TBEC
81.0000 mg | DELAYED_RELEASE_TABLET | Freq: Every day | ORAL | Status: DC
  Administered 2019-01-26 – 2019-01-31 (×6): 81 mg via ORAL
  Filled 2019-01-25 (×8): qty 1

## 2019-01-25 MED ORDER — ZINC SULFATE 220 (50 ZN) MG PO CAPS
220.0000 mg | ORAL_CAPSULE | Freq: Every day | ORAL | Status: DC
  Administered 2019-01-25 – 2019-01-31 (×7): 220 mg via ORAL
  Filled 2019-01-25 (×9): qty 1

## 2019-01-25 MED ORDER — ENOXAPARIN SODIUM 40 MG/0.4ML ~~LOC~~ SOLN: 40.0000 mg | SUBCUTANEOUS | Status: DC

## 2019-01-25 MED ORDER — SODIUM CHLORIDE 0.9 % IV SOLN
1.0000 g | INTRAVENOUS | Status: AC
  Administered 2019-01-25 – 2019-01-26 (×2): 1 g via INTRAVENOUS
  Filled 2019-01-25 (×2): qty 10

## 2019-01-25 MED ORDER — ENOXAPARIN SODIUM 30 MG/0.3ML ~~LOC~~ SOLN
30.0000 mg | Freq: Two times a day (BID) | SUBCUTANEOUS | Status: DC
  Administered 2019-01-25 – 2019-01-27 (×6): 30 mg via SUBCUTANEOUS
  Filled 2019-01-25 (×6): qty 0.3

## 2019-01-25 MED ORDER — METOPROLOL TARTRATE 25 MG PO TABS
12.5000 mg | ORAL_TABLET | Freq: Two times a day (BID) | ORAL | Status: DC
  Administered 2019-01-25 – 2019-01-30 (×10): 12.5 mg via ORAL
  Filled 2019-01-25 (×9): qty 1

## 2019-01-25 MED ORDER — DONEPEZIL HCL 10 MG PO TABS
5.0000 mg | ORAL_TABLET | Freq: Every day | ORAL | Status: DC
  Administered 2019-01-25 – 2019-01-28 (×4): 5 mg via ORAL
  Filled 2019-01-25 (×4): qty 1

## 2019-01-25 MED ORDER — ACETAMINOPHEN 325 MG PO TABS
650.0000 mg | ORAL_TABLET | Freq: Four times a day (QID) | ORAL | Status: DC | PRN
  Filled 2019-01-25: qty 2

## 2019-01-25 MED ORDER — SODIUM CHLORIDE 0.9 % IV SOLN
100.0000 mg | INTRAVENOUS | Status: AC
  Administered 2019-01-25 – 2019-01-28 (×4): 100 mg via INTRAVENOUS
  Filled 2019-01-25 (×4): qty 20

## 2019-01-25 MED ORDER — ADULT MULTIVITAMIN W/MINERALS CH
1.0000 | ORAL_TABLET | Freq: Every day | ORAL | Status: DC
  Administered 2019-01-25 – 2019-01-31 (×7): 1 via ORAL
  Filled 2019-01-25 (×8): qty 1

## 2019-01-25 MED ORDER — DEXAMETHASONE SODIUM PHOSPHATE 10 MG/ML IJ SOLN
6.0000 mg | INTRAMUSCULAR | Status: DC
  Administered 2019-01-25 – 2019-02-01 (×8): 6 mg via INTRAVENOUS
  Filled 2019-01-25 (×9): qty 1

## 2019-01-25 MED ORDER — ENOXAPARIN SODIUM 30 MG/0.3ML ~~LOC~~ SOLN: 30.0000 mg | SUBCUTANEOUS | Status: DC

## 2019-01-25 MED ORDER — ATORVASTATIN CALCIUM 40 MG PO TABS
40.0000 mg | ORAL_TABLET | Freq: Every day | ORAL | Status: DC
  Administered 2019-01-26 – 2019-01-31 (×6): 40 mg via ORAL
  Filled 2019-01-25 (×8): qty 1

## 2019-01-25 MED ORDER — HYDROCOD POLST-CPM POLST ER 10-8 MG/5ML PO SUER: 5.0000 mL | Freq: Two times a day (BID) | ORAL | Status: DC | PRN

## 2019-01-25 MED ORDER — VITAMIN C 500 MG PO TABS
500.0000 mg | ORAL_TABLET | Freq: Every day | ORAL | Status: DC
  Administered 2019-01-25 – 2019-01-31 (×7): 500 mg via ORAL
  Filled 2019-01-25 (×9): qty 1

## 2019-01-25 MED ORDER — ENSURE ENLIVE PO LIQD
237.0000 mL | Freq: Three times a day (TID) | ORAL | Status: DC
  Administered 2019-01-25 – 2019-02-02 (×12): 237 mL via ORAL

## 2019-01-25 MED ORDER — INSULIN ASPART 100 UNIT/ML ~~LOC~~ SOLN
0.0000 [IU] | Freq: Three times a day (TID) | SUBCUTANEOUS | Status: DC
  Administered 2019-01-25: 1 [IU] via SUBCUTANEOUS
  Administered 2019-01-25: 5 [IU] via SUBCUTANEOUS
  Administered 2019-01-26 (×2): 2 [IU] via SUBCUTANEOUS
  Administered 2019-01-26: 1 [IU] via SUBCUTANEOUS
  Administered 2019-01-27 – 2019-01-28 (×3): 3 [IU] via SUBCUTANEOUS
  Administered 2019-01-28: 5 [IU] via SUBCUTANEOUS
  Administered 2019-01-28: 2 [IU] via SUBCUTANEOUS
  Administered 2019-01-29: 5 [IU] via SUBCUTANEOUS
  Administered 2019-01-29 – 2019-01-30 (×3): 3 [IU] via SUBCUTANEOUS
  Administered 2019-01-30: 11:00:00 2 [IU] via SUBCUTANEOUS
  Administered 2019-01-30 – 2019-01-31 (×3): 3 [IU] via SUBCUTANEOUS
  Administered 2019-01-31: 5 [IU] via SUBCUTANEOUS
  Administered 2019-02-01: 3 [IU] via SUBCUTANEOUS
  Administered 2019-02-01: 1 [IU] via SUBCUTANEOUS

## 2019-01-25 MED ORDER — MEMANTINE HCL 5 MG PO TABS
5.0000 mg | ORAL_TABLET | Freq: Two times a day (BID) | ORAL | Status: DC
  Administered 2019-01-25 – 2019-02-01 (×11): 5 mg via ORAL
  Filled 2019-01-25 (×17): qty 1

## 2019-01-25 MED ORDER — SODIUM CHLORIDE 0.9% FLUSH
3.0000 mL | INTRAVENOUS | Status: DC | PRN
  Administered 2019-01-28: 11:00:00 3 mL via INTRAVENOUS
  Filled 2019-01-25: qty 3

## 2019-01-25 NOTE — Progress Notes (Addendum)
Initial Nutrition Assessment RD working remotely.  DOCUMENTATION CODES:   Not applicable  INTERVENTION:    Recommend liberalize diet to Regular.  Ensure Enlive po TID, each supplement provides 350 kcal and 20 grams of protein.  MVI daily.  Pt receiving Hormel Shake daily with Breakfast which provides 520 kcals and 22 g of protein and Magic cup BID with lunch and dinner, each supplement provides 290 kcal and 9 grams of protein, automatically on meal trays to optimize nutritional intake.   NUTRITION DIAGNOSIS:   Increased nutrient needs related to wound healing, acute illness(COVID) as evidenced by estimated needs.  GOAL:   Patient will meet greater than or equal to 90% of their needs  MONITOR:   PO intake, Supplement acceptance, Labs  REASON FOR ASSESSMENT:   Malnutrition Screening Tool    ASSESSMENT:   78 yo female admitted with SOB, dehydration, UTI, COVID positive. PMH includes HTN, dementia, CVA, DM, minimally verbal at baseline.   Unable to obtain nutrition hx from patient. Weight history reviewed. Patient has lost 10% of usual weight within the past 3 months, which is significant for the time frame. Suspect patient is malnourished; unable to obtain enough information at this time for identification of malnutrition. Patient is consuming 0-15% of meals. Patient would benefit from PO supplements.   Labs reviewed. Sodium 155 (H) CBG's: 125-108-126  Medications reviewed and include decadron, novolog, vitamin C, zinc sulfate.   NUTRITION - FOCUSED PHYSICAL EXAM:  deferred  Diet Order:   Diet Order            Diet Heart Room service appropriate? Yes; Fluid consistency: Thin  Diet effective now              EDUCATION NEEDS:   Not appropriate for education at this time  Skin:  Skin Assessment: Skin Integrity Issues: Skin Integrity Issues:: Stage II Stage II: buttocks  Last BM:  no BM documented this admission  Height:   Ht Readings from Last 1  Encounters:  01/23/19 5\' 5"  (1.651 m)    Weight:   Wt Readings from Last 1 Encounters:  01/25/19 59.4 kg    Ideal Body Weight:  56.8 kg  BMI:  Body mass index is 21.79 kg/m.  Estimated Nutritional Needs:   Kcal:  1600-1800  Protein:  80-90 gm  Fluid:  >/= 1.7 L    Molli Barrows, RD, LDN, [place] Pager [phone] After Hours Pager [phone]

## 2019-01-25 NOTE — Progress Notes (Signed)
Run of  VTach noted, rate up to 203. Sustained for about one minute. HR returned to NSR 70-90's. Patient aymptomatic, sleeping, but opens eyes to verbal stimuli. MD notified. Will keep patient on telemetry per MD instructions.

## 2019-01-25 NOTE — Progress Notes (Signed)
Paged Dr. Shanon Brow to notify that patient has arrived to [facility]. Awaiting orders. Vitals stable at this time. Will monitor closely.

## 2019-01-25 NOTE — Progress Notes (Signed)
PROGRESS NOTE  Lisa Hahn [id number] DOB: [birth year] DOA: 01/25/2019 PCP: Marco Collie, MD   LOS: 0 days   No charge note, patient admitted overnight  Brief Narrative / Interim history: 78 year old female with history of hypertension, dementia, prior CVA and poorly verbal at baseline, hypertension, diabetes mellitus, admitted from SNF on 01/23/2021 elements with shortness of breath, dehydration, UTI and was found also to be Covid positive.  Chest x-ray on admission showed multifocal pneumonia.  She was also hyponatremic with a sodium of 160.  She was transferred to [facility] on 10/20  Subjective / 24h Interval events: Alert when I entered the room, looks at me, does not answer any questions.  Does not follow commands.  Assessment & Plan: Principal Problem:   Pneumonia due to COVID-19 virus Active Problems:   Type 2 diabetes mellitus (HCC)   Dementia (HCC)   Abdominal pain, chronic, epigastric   UTI (urinary tract infection)   Principal Problem Acute hypoxic respiratory failure due to COVID-19 pneumonia -Patient started on steroids as well as remdesivir, continue -Minimal oxygen requirements of 2 L nasal cannula, satting 100%.  Wean to room air as tolerated -Continue to monitor inflammatory markers  COVID-19 Labs  Recent Labs    01/24/19 0750 01/25/19 0715  DDIMER  --  6.66*  CRP 9.7* 9.5*    Lab Results  Component Value Date   SARSCOV2NAA POSITIVE (A) 01/23/2019    Active Problems Type 2 diabetes mellitus -Hold home oral agents, Metformin and Januvia, placed on sliding scale.  CBGs as below, continue to monitor  CBG (last 3)  Recent Labs    01/24/19 2004 01/25/19 0332 01/25/19 0818  GLUCAP 264* 125* 108*   Nonsustained VT/SVT -Most recent 2D echo showed an EF of 60 to 123456, grade 1 diastolic dysfunction, this was back in 2018.  Keep potassium around 4, replaced today, check a magnesium level -We will start low-dose beta-blockers, continue to monitor  on telemetry  Hypernatremia -Likely secondary to dehydration, improving with IV fluids, continue saline.  Sodium this morning 155  Prior CVA -Resume home aspirin  Dementia -Resume Aricept and Namenda, I am not sure about Depakote, pharmacy still to fully reconcile home medications, resume that later if she is still on it  Hypertension -On metoprolol as above, hold losartan  UTI -Urinalysis at [facility] with evidence of infection, continue ceftriaxone, monitor urine cultures  Scheduled Meds: . dexamethasone (DECADRON) injection  6 mg Intravenous Q24H  . enoxaparin (LOVENOX) injection  0.5 mg/kg Subcutaneous Q24H  . insulin aspart  0-9 Units Subcutaneous TID WC  . metoprolol tartrate  12.5 mg Oral BID  . sodium chloride flush  3 mL Intravenous Q12H  . vitamin C  500 mg Oral Daily  . zinc sulfate  220 mg Oral Daily   Continuous Infusions: . sodium chloride    . sodium chloride 50 mL/hr at 01/25/19 0340  . cefTRIAXone (ROCEPHIN)  IV    . remdesivir 100 mg in NS 250 mL     PRN Meds:.sodium chloride, acetaminophen, chlorpheniramine-HYDROcodone, guaiFENesin-dextromethorphan, ondansetron **OR** ondansetron (ZOFRAN) IV, sodium chloride flush  DVT prophylaxis: Lovenox Code Status: Full code Family Communication: Called son Linton Rump, [phone], unable to reach Disposition Plan: TBD  Consultants:  None   Procedures:  None   Microbiology  None   Antimicrobials: Ceftriaxone     Objective: Vitals:   01/25/19 0200 01/25/19 0400 01/25/19 0510 01/25/19 0800  BP: 129/76 124/66  (!) 96/58  Pulse:  79    Resp: 19  14  15  Temp: 98.1 F (36.7 C) 98.1 F (36.7 C) 98 F (36.7 C) 98 F (36.7 C)  TempSrc: Axillary Axillary Oral Axillary  SpO2: 100% (!) 82%  100%  Weight:   59.4 kg     Intake/Output Summary (Last 24 hours) at 01/25/2019 1046 Last data filed at 01/25/2019 0630 Gross per 24 hour  Intake 0 ml  Output 300 ml  Net -300 ml   Filed Weights   01/25/19 0510   Weight: 59.4 kg    Examination:  Constitutional: NAD Eyes: lids and conjunctivae normal ENMT: Mucous membranes are moist.  Respiratory: Mostly clear on anterior auscultation, no wheezing heard.  Shallow breathing Cardiovascular: Regular rate and rhythm, no murmurs / rubs / gallops. No LE edema. Abdomen: no tenderness. Bowel sounds positive.  Musculoskeletal: no clubbing / cyanosis.  Skin: no rashes Neurologic: Does not follow commands but moves all 4 extremities independently and appears equal    Data Reviewed: I have independently reviewed following labs and imaging studies   CBC: Recent Labs  Lab 01/23/19 0915 01/24/19 0538 01/25/19 0715  WBC 10.8* 12.3* 11.9*  NEUTROABS 8.0*  --   --   HGB 15.3* 13.0 12.0  HCT 48.1* 42.9 39.2  MCV 88.6 92.5 92.5  PLT 223 194 [id number]   Basic Metabolic Panel: Recent Labs  Lab 01/23/19 0915 01/24/19 0538 01/25/19 0715  NA 155* 160* 155*  K 4.2 3.0* 3.7  CL 113* 120* 121*  CO2 28 30 24   GLUCOSE 195* 204* 134*  BUN 50* 35* 29*  CREATININE 0.77 0.52 0.64  CALCIUM 9.7 8.9 9.0   GFR: Estimated Creatinine Clearance: 52.2 mL/min (by C-G formula based on SCr of 0.64 mg/dL). Liver Function Tests: Recent Labs  Lab 01/23/19 0915 01/25/19 0715  AST 32 31  ALT 16 17  ALKPHOS 37* 38  BILITOT 1.6* 1.4*  PROT 8.4* 6.8  ALBUMIN 3.2* 2.6*   No results for input(s): LIPASE, AMYLASE in the last 168 hours. No results for input(s): AMMONIA in the last 168 hours. Coagulation Profile: No results for input(s): INR, PROTIME in the last 168 hours. Cardiac Enzymes: No results for input(s): CKTOTAL, CKMB, CKMBINDEX, TROPONINI in the last 168 hours. BNP (last 3 results) No results for input(s): PROBNP in the last 8760 hours. HbA1C: Recent Labs    01/25/19 0715  HGBA1C 7.1*   CBG: Recent Labs  Lab 01/24/19 1155 01/24/19 1648 01/24/19 2004 01/25/19 0332 01/25/19 0818  GLUCAP 196* 224* 264* 125* 108*   Lipid Profile: No results for  input(s): CHOL, HDL, LDLCALC, TRIG, CHOLHDL, LDLDIRECT in the last 72 hours. Thyroid Function Tests: No results for input(s): TSH, T4TOTAL, FREET4, T3FREE, THYROIDAB in the last 72 hours. Anemia Panel: No results for input(s): VITAMINB12, FOLATE, FERRITIN, TIBC, IRON, RETICCTPCT in the last 72 hours. Urine analysis:    Component Value Date/Time   COLORURINE AMBER (A) 01/23/2019 1031   APPEARANCEUR CLOUDY (A) 01/23/2019 1031   APPEARANCEUR Clear 02/25/2014 1352   LABSPEC 1.021 01/23/2019 1031   LABSPEC 1.015 02/25/2014 1352   PHURINE 5.0 01/23/2019 1031   GLUCOSEU NEGATIVE 01/23/2019 1031   GLUCOSEU Negative 02/25/2014 1352   HGBUR SMALL (A) 01/23/2019 1031   BILIRUBINUR NEGATIVE 01/23/2019 1031   BILIRUBINUR negative 08/22/2016 0900   BILIRUBINUR Negative 02/25/2014 1352   KETONESUR NEGATIVE 01/23/2019 1031   PROTEINUR 100 (A) 01/23/2019 1031   UROBILINOGEN 0.2 08/22/2016 0900   UROBILINOGEN 1.0 07/15/2010 0827   NITRITE POSITIVE (A) 01/23/2019 1031  LEUKOCYTESUR MODERATE (A) 01/23/2019 1031   LEUKOCYTESUR 1+ 02/25/2014 1352   Sepsis Labs: Invalid input(s): PROCALCITONIN, LACTICIDVEN  Recent Results (from the past 240 hour(s))  SARS CORONAVIRUS 2 (TAT 6-24 HRS) Nasopharyngeal Nasopharyngeal Swab     Status: Abnormal   Collection Time: 01/23/19  9:15 AM   Specimen: Nasopharyngeal Swab  Result Value Ref Range Status   SARS Coronavirus 2 POSITIVE (A) NEGATIVE Final    Comment: RESULT CALLED TO, READ BACK BY AND VERIFIED WITH: DARLENE LISTOPAD RN.@1800  ON 10.18.2020 BY TCALDWELL MT. (NOTE) SARS-CoV-2 target nucleic acids are DETECTED. The SARS-CoV-2 RNA is generally detectable in upper and lower respiratory specimens during the acute phase of infection. Positive results are indicative of active infection with SARS-CoV-2. Clinical  correlation with patient history and other diagnostic information is necessary to determine patient infection status. Positive results do  not  rule out bacterial infection or co-infection with other viruses. The expected result is Negative. Fact Sheet for Patients: SugarRoll.be Fact Sheet for Healthcare Providers: https://www.woods-mathews.com/ This test is not yet approved or cleared by the [place] FDA and  has been authorized for detection and/or diagnosis of SARS-CoV-2 by FDA under an Emergency Use Authorization (EUA). This EUA will remain  in effect (meaning this te st can be used) for the duration of the COVID-19 declaration under Section 564(b)(1) of the Act, 21 U.S.C. section 360bbb-3(b)(1), unless the authorization is terminated or revoked sooner. Performed at [facility], [place] [address], [place], [place] [address]   Culture, blood (routine x 2)     Status: None (Preliminary result)   Collection Time: 01/23/19 11:38 AM   Specimen: BLOOD  Result Value Ref Range Status   Specimen Description BLOOD LEFT ANTECUBITAL  Final   Special Requests   Final    BOTTLES DRAWN AEROBIC AND ANAEROBIC Blood Culture adequate volume   Culture   Final    NO GROWTH 2 DAYS Performed at [facility], [address], [place], [place] [address]    Report Status PENDING  Incomplete  Culture, blood (routine x 2)     Status: None (Preliminary result)   Collection Time: 01/23/19 11:38 AM   Specimen: BLOOD  Result Value Ref Range Status   Specimen Description BLOOD RIGHT ANTECUBITAL  Final   Special Requests   Final    BOTTLES DRAWN AEROBIC AND ANAEROBIC Blood Culture adequate volume   Culture   Final    NO GROWTH 2 DAYS Performed at [facility], [address] [place], [place] [address]    Report Status PENDING  Incomplete  MRSA PCR Screening     Status: None   Collection Time: 01/23/19  3:54 PM   Specimen: Nasal Mucosa; Nasopharyngeal  Result Value Ref Range Status   MRSA by PCR NEGATIVE NEGATIVE Final    Comment:        The GeneXpert MRSA Assay (FDA  approved for NASAL specimens only), is one component of a comprehensive MRSA colonization surveillance program. It is not intended to diagnose MRSA infection nor to guide or monitor treatment for MRSA infections. Performed at [facility], [address], [place], [place] [address]       Radiology Studies: No results found.   Marzetta Board, MD, PhD [facility]  Contact via  [email or web address]  [place] P: [phone] F: [phone]

## 2019-01-25 NOTE — Progress Notes (Signed)
Patient in bed resting, no s/s of pain or distress. All medication given well tolerated. 16 second run of SVT during shift, MD notified. Will continue to monitor for remainder of shift.

## 2019-01-25 NOTE — Progress Notes (Signed)
Transferred to stretcher.  Released to [facility] staff.  Son Linton Rump aware of transfer this shift.

## 2019-01-25 NOTE — Progress Notes (Signed)
Maurice(son) returned call.  Most questions answered at this time.  Linton Rump would like for the doctor to call him tomorrow [phone].

## 2019-01-25 NOTE — Progress Notes (Signed)
Her d-dimer>5. Ok to increase her lovenox to 0.5mg /kg BID per Dr. Cruzita Lederer.  Scr<1, h/h stable  Increase lovenox to 30mg  SQ BID  Onnie Boer, PharmD, [place], AAHIVP, CPP Infectious Disease Pharmacist 01/25/2019 1:28 PM

## 2019-01-25 NOTE — Progress Notes (Signed)
Called Maurice(son) for updates,  No answer left message to return call.

## 2019-01-25 NOTE — H&P (Signed)
History and Physical    Nikolle Tanks [id number] DOB: 09/22/1940 DOA: 01/25/2019  PCP: Marco Collie, MD  Patient coming from: snf  Chief Complaint:  sob  HPI: Lisa Hahn is a 78 y.o. female with medical history significant of htn, dementia, dm with sob.  Nonverbal at baseline due to dementia.  Presented sob, hypoxic and dehydrated.  covid positive.  Pt referred for admission for bilateral covid pna.   Review of Systems: unobtainable due to dementia  Past Medical History:  Diagnosis Date  . Arthritis   . Dementia ([place])   . Diabetes mellitus without complication ([place])   . Hypertension   . Stroke [facility])     Past Surgical History:  Procedure Laterality Date  . ABDOMINAL HYSTERECTOMY    . ABDOMINAL SURGERY     blockage      reports that she has quit smoking. She has never used smokeless tobacco. She reports that she does not drink alcohol or use drugs.  Allergies  Allergen Reactions  . Aspirin Nausea And Vomiting    Can tolerate baby aspirin  . Motrin [Ibuprofen] Other (See Comments)    Reaction: Burns stomach    Family History  Problem Relation Age of Onset  . Arthritis Other        Parent    Prior to Admission medications   Medication Sig Start Date End Date Taking? Authorizing Provider  acetaminophen (TYLENOL) 325 MG tablet Take 650 mg by mouth every 6 (six) hours as needed.    [provider]  aspirin EC 81 MG tablet Take 81 mg by mouth daily.    [provider]  atorvastatin (LIPITOR) 40 MG tablet Take 40 mg by mouth daily.    [provider]  ceFEPIme 2 g in sodium chloride 0.9 % 100 mL Inject 2 g into the vein every 12 (twelve) hours. 01/24/19   Henreitta Leber, MD  cholecalciferol (VITAMIN D3) 25 MCG (1000 UT) tablet Take 1,000 Units by mouth daily.    [provider]  dexamethasone (DECADRON) 6 MG tablet Take 1 tablet (6 mg total) by mouth daily. 01/25/19   Henreitta Leber, MD  divalproex (DEPAKOTE SPRINKLE) 125 MG  capsule Take 250 mg by mouth 3 (three) times daily.    [provider]  donepezil (ARICEPT) 5 MG tablet Take 5 mg by mouth at bedtime.    [provider]  enoxaparin (LOVENOX) 40 MG/0.4ML injection Inject 0.4 mLs (40 mg total) into the skin daily. 01/24/19   Henreitta Leber, MD  guaiFENesin (ROBITUSSIN) 100 MG/5ML liquid Take 200 mg by mouth 3 (three) times daily as needed for cough.    [provider]  losartan (COZAAR) 50 MG tablet Take 50 mg by mouth daily.    [provider]  Melatonin 10 MG TABS Take 10 mg by mouth at bedtime.    [provider]  memantine (NAMENDA) 5 MG tablet Take 5 mg by mouth 2 (two) times daily.    [provider]  metFORMIN (GLUCOPHAGE) 1000 MG tablet Take 1,000 mg by mouth 2 (two) times daily with a meal.    [provider]  potassium chloride (KLOR-CON) 10 MEQ tablet Take 10 mEq by mouth daily.    [provider]  Potassium Chloride in Dextrose (DEXTROSE 5 % WITH KCL 20 MEQ / L) 20-5 MEQ/L-% Inject 1,000 mLs (20 mEq total) into the vein continuous. 01/24/19   Henreitta Leber, MD  sitaGLIPtin (JANUVIA) 100 MG tablet Take 100  mg by mouth daily.    [provider]  traMADol (ULTRAM) 50 MG tablet Take 1 tablet (50 mg total) by mouth every 6 (six) hours as needed. 01/24/19   Henreitta Leber, MD  traZODone (DESYREL) 50 MG tablet Take 50 mg by mouth at bedtime.    [provider]    Physical Exam: Vitals:   01/25/19 0200  BP: 129/76  Resp: 19  Temp: 98.1 F (36.7 C)  TempSrc: Axillary  SpO2: 100%      Constitutional: NAD, calm, comfortable Vitals:   01/25/19 0200  BP: 129/76  Resp: 19  Temp: 98.1 F (36.7 C)  TempSrc: Axillary  SpO2: 100%   Eyes: PERRL, lids and conjunctivae normal ENMT: Mucous membranes are moist. Posterior pharynx clear of any exudate or lesions.Normal dentition.  Neck: normal, supple, no masses, no thyromegaly Respiratory: clear to  auscultation bilaterally, no wheezing, no crackles. Normal respiratory effort. No accessory muscle use.  Cardiovascular: Regular rate and rhythm, no murmurs / rubs / gallops. No extremity edema. 2+ pedal pulses. No carotid bruits.  Abdomen: no tenderness, no masses palpated. No hepatosplenomegaly. Bowel sounds positive.  Musculoskeletal: no clubbing / cyanosis. No joint deformity upper and lower extremities. Good ROM, no contractures. Normal muscle tone.  Skin: no rashes, lesions, ulcers. No induration Neurologic: CN 2-12 grossly intact. Sensation intact, DTR normal. Strength 5/5 in all 4.  Psychiatric:  Alert and oriented x 0. Normal mood.    Labs on Admission: I have personally reviewed following labs and imaging studies  CBC: Recent Labs  Lab 01/23/19 0915 01/24/19 0538  WBC 10.8* 12.3*  NEUTROABS 8.0*  --   HGB 15.3* 13.0  HCT 48.1* 42.9  MCV 88.6 92.5  PLT 223 [id number]   Basic Metabolic Panel: Recent Labs  Lab 01/23/19 0915 01/24/19 0538  NA 155* 160*  K 4.2 3.0*  CL 113* 120*  CO2 28 30  GLUCOSE 195* 204*  BUN 50* 35*  CREATININE 0.77 0.52  CALCIUM 9.7 8.9   GFR: Estimated Creatinine Clearance: 49.1 mL/min (by C-G formula based on SCr of 0.52 mg/dL). Liver Function Tests: Recent Labs  Lab 01/23/19 0915  AST 32  ALT 16  ALKPHOS 37*  BILITOT 1.6*  PROT 8.4*  ALBUMIN 3.2*   No results for input(s): LIPASE, AMYLASE in the last 168 hours. No results for input(s): AMMONIA in the last 168 hours. Coagulation Profile: No results for input(s): INR, PROTIME in the last 168 hours. Cardiac Enzymes: No results for input(s): CKTOTAL, CKMB, CKMBINDEX, TROPONINI in the last 168 hours. BNP (last 3 results) No results for input(s): PROBNP in the last 8760 hours. HbA1C: No results for input(s): HGBA1C in the last 72 hours. CBG: Recent Labs  Lab 01/23/19 1626 01/24/19 0721 01/24/19 1155 01/24/19 1648 01/24/19 2004  GLUCAP 163* 131* 196* 224* 264*   Lipid Profile: No  results for input(s): CHOL, HDL, LDLCALC, TRIG, CHOLHDL, LDLDIRECT in the last 72 hours. Thyroid Function Tests: No results for input(s): TSH, T4TOTAL, FREET4, T3FREE, THYROIDAB in the last 72 hours. Anemia Panel: No results for input(s): VITAMINB12, FOLATE, FERRITIN, TIBC, IRON, RETICCTPCT in the last 72 hours. Urine analysis:    Component Value Date/Time   COLORURINE AMBER (A) 01/23/2019 1031   APPEARANCEUR CLOUDY (A) 01/23/2019 1031   APPEARANCEUR Clear 02/25/2014 1352   LABSPEC 1.021 01/23/2019 1031   LABSPEC 1.015 02/25/2014 1352   PHURINE 5.0 01/23/2019 1031   [place] 01/23/2019 1031   GLUCOSEU Negative 02/25/2014 1352  HGBUR SMALL (A) 01/23/2019 1031   BILIRUBINUR NEGATIVE 01/23/2019 1031   BILIRUBINUR negative 08/22/2016 0900   BILIRUBINUR Negative 02/25/2014 1352   KETONESUR NEGATIVE 01/23/2019 1031   PROTEINUR 100 (A) 01/23/2019 1031   UROBILINOGEN 0.2 08/22/2016 0900   UROBILINOGEN 1.0 07/15/2010 0827   NITRITE POSITIVE (A) 01/23/2019 1031   LEUKOCYTESUR MODERATE (A) 01/23/2019 1031   LEUKOCYTESUR 1+ 02/25/2014 1352   Sepsis Labs: !!!!!!!!!!!!!!!!!!!!!!!!!!!!!!!!!!!!!!!!!!!! @LABRCNTIP (procalcitonin:4,lacticidven:4) ) Recent Results (from the past 240 hour(s))  SARS CORONAVIRUS 2 (TAT 6-24 HRS) Nasopharyngeal Nasopharyngeal Swab     Status: Abnormal   Collection Time: 01/23/19  9:15 AM   Specimen: Nasopharyngeal Swab  Result Value Ref Range Status   SARS Coronavirus 2 POSITIVE (A) NEGATIVE Final    Comment: RESULT CALLED TO, READ BACK BY AND VERIFIED WITH: DARLENE LISTOPAD RN.@1800  ON 10.18.2020 BY TCALDWELL MT. (NOTE) SARS-CoV-2 target nucleic acids are DETECTED. The SARS-CoV-2 RNA is generally detectable in upper and lower respiratory specimens during the acute phase of infection. Positive results are indicative of active infection with SARS-CoV-2. Clinical  correlation with patient history and other diagnostic information is necessary to determine  patient infection status. Positive results do  not rule out bacterial infection or co-infection with other viruses. The expected result is Negative. Fact Sheet for Patients: SugarRoll.be Fact Sheet for Healthcare Providers: https://www.woods-mathews.com/ This test is not yet approved or cleared by the [place] FDA and  has been authorized for detection and/or diagnosis of SARS-CoV-2 by FDA under an Emergency Use Authorization (EUA). This EUA will remain  in effect (meaning this te st can be used) for the duration of the COVID-19 declaration under Section 564(b)(1) of the Act, 21 U.S.C. section 360bbb-3(b)(1), unless the authorization is terminated or revoked sooner. Performed at [facility], [place] [address], [place], [place] [address]   Culture, blood (routine x 2)     Status: None (Preliminary result)   Collection Time: 01/23/19 11:38 AM   Specimen: BLOOD  Result Value Ref Range Status   Specimen Description BLOOD LEFT ANTECUBITAL  Final   Special Requests   Final    BOTTLES DRAWN AEROBIC AND ANAEROBIC Blood Culture adequate volume   Culture   Final    NO GROWTH < 24 HOURS Performed at [facility], [address] [place], [place] [address]    Report Status PENDING  Incomplete  Culture, blood (routine x 2)     Status: None (Preliminary result)   Collection Time: 01/23/19 11:38 AM   Specimen: BLOOD  Result Value Ref Range Status   Specimen Description BLOOD RIGHT ANTECUBITAL  Final   Special Requests   Final    BOTTLES DRAWN AEROBIC AND ANAEROBIC Blood Culture adequate volume   Culture   Final    NO GROWTH < 24 HOURS Performed at [facility], [address], [place], [place] [address]    Report Status PENDING  Incomplete  MRSA PCR Screening     Status: None   Collection Time: 01/23/19  3:54 PM   Specimen: Nasal Mucosa; Nasopharyngeal  Result Value Ref Range Status   MRSA by PCR NEGATIVE NEGATIVE  Final    Comment:        The GeneXpert MRSA Assay (FDA approved for NASAL specimens only), is one component of a comprehensive MRSA colonization surveillance program. It is not intended to diagnose MRSA infection nor to guide or monitor treatment for MRSA infections. Performed at [facility], [address], [place], [place] [address]  Radiological Exams on Admission: Dg Chest Port 1 View  Result Date: 01/23/2019 CLINICAL DATA:  Shortness of breath EXAM: PORTABLE CHEST 1 VIEW COMPARISON:  01/27/2017 FINDINGS: Mild patchy opacity in the lingula and bilateral lower lobes. No pleural effusion or pneumothorax. The heart is normal in size. IMPRESSION: Mild patchy opacity in the lingula and bilateral lower lobes, worrisome for multifocal pneumonia. Electronically Signed   By: Julian Hy M.D.   On: 01/23/2019 10:25   Old chart reviewed cxr reviewed bilateral infiltrates  Assessment/Plan 78 yo female with dementia comes in with acute hypoxia due to bilateral covid 19 pna  Principal Problem:   Pneumonia due to COVID-19 virus- decadron, remdisivir ordered along with lovenox/vit c/zinc.  Supplemental 02 and wean to keep above 88%.  Daily labs ordered.    Active Problems:   Type 2 diabetes mellitus ([place])- ssi   Dementia ([place])- noted   Abdominal pain, chronic, epigastric- stable   UTI (urinary tract infection)-  Iv rocephin .  uc pending.     DVT prophylaxis:  lovenox Code Status:  full Family Communication: none Disposition Plan:  days Consults called: none Admission status:  admission   Kellar Westberg A MD [facility]  If 7PM-7AM, please contact night-coverage [email or web address] Password [email or web address]  01/25/2019, 2:46 AM

## 2019-01-26 ENCOUNTER — Other Ambulatory Visit: Payer: Self-pay

## 2019-01-26 ENCOUNTER — Inpatient Hospital Stay: Payer: Self-pay

## 2019-01-26 DIAGNOSIS — E119 Type 2 diabetes mellitus without complications: Secondary | ICD-10-CM

## 2019-01-26 DIAGNOSIS — N390 Urinary tract infection, site not specified: Secondary | ICD-10-CM

## 2019-01-26 LAB — CBC WITH DIFFERENTIAL/PLATELET
Abs Immature Granulocytes: 0.06 10*3/uL (ref 0.00–0.07)
Basophils Absolute: 0 10*3/uL (ref 0.0–0.1)
Basophils Relative: 0 %
Eosinophils Absolute: 0 10*3/uL (ref 0.0–0.5)
Eosinophils Relative: 0 %
HCT: 35.7 % — ABNORMAL LOW (ref 36.0–46.0)
Hemoglobin: 11 g/dL — ABNORMAL LOW (ref 12.0–15.0)
Immature Granulocytes: 1 %
Lymphocytes Relative: 15 %
Lymphs Abs: 1.1 10*3/uL (ref 0.7–4.0)
MCH: 28.1 pg (ref 26.0–34.0)
MCHC: 30.8 g/dL (ref 30.0–36.0)
MCV: 91.1 fL (ref 80.0–100.0)
Monocytes Absolute: 0.4 10*3/uL (ref 0.1–1.0)
Monocytes Relative: 5 %
Neutro Abs: 5.5 10*3/uL (ref 1.7–7.7)
Neutrophils Relative %: 79 %
Platelets: 213 10*3/uL (ref 150–400)
RBC: 3.92 MIL/uL (ref 3.87–5.11)
RDW: 16.3 % — ABNORMAL HIGH (ref 11.5–15.5)
WBC: 7 10*3/uL (ref 4.0–10.5)
nRBC: 0 % (ref 0.0–0.2)

## 2019-01-26 LAB — COMPREHENSIVE METABOLIC PANEL
ALT: 15 U/L (ref 0–44)
AST: 24 U/L (ref 15–41)
Albumin: 2.3 g/dL — ABNORMAL LOW (ref 3.5–5.0)
Alkaline Phosphatase: 36 U/L — ABNORMAL LOW (ref 38–126)
Anion gap: 10 (ref 5–15)
BUN: 35 mg/dL — ABNORMAL HIGH (ref 8–23)
CO2: 23 mmol/L (ref 22–32)
Calcium: 8.3 mg/dL — ABNORMAL LOW (ref 8.9–10.3)
Chloride: 115 mmol/L — ABNORMAL HIGH (ref 98–111)
Creatinine, Ser: 0.54 mg/dL (ref 0.44–1.00)
GFR calc Af Amer: >60 mL/min (ref >60)
GFR calc non Af Amer: >60 mL/min (ref >60)
Glucose, Bld: 261 mg/dL — ABNORMAL HIGH (ref 70–99)
Potassium: 3.1 mmol/L — ABNORMAL LOW (ref 3.5–5.1)
Sodium: 148 mmol/L — ABNORMAL HIGH (ref 135–145)
Total Bilirubin: 0.9 mg/dL (ref 0.3–1.2)
Total Protein: 5.8 g/dL — ABNORMAL LOW (ref 6.5–8.1)

## 2019-01-26 LAB — C-REACTIVE PROTEIN: CRP: 6.3 mg/dL — ABNORMAL HIGH (ref <1.0)

## 2019-01-26 LAB — GLUCOSE, CAPILLARY
Glucose-Capillary: 186 mg/dL — ABNORMAL HIGH (ref 70–99)
Glucose-Capillary: 141 mg/dL — ABNORMAL HIGH (ref 70–99)
Glucose-Capillary: 287 mg/dL — ABNORMAL HIGH (ref 70–99)

## 2019-01-26 LAB — URINE CULTURE: Culture: NO GROWTH

## 2019-01-26 LAB — D-DIMER, QUANTITATIVE: D-Dimer, Quant: 5.12 ug/mL-FEU — ABNORMAL HIGH (ref 0.00–0.50)

## 2019-01-26 MED ORDER — SODIUM CHLORIDE 0.9% FLUSH: 10.0000 mL | INTRAVENOUS | Status: DC | PRN

## 2019-01-26 MED ORDER — SODIUM CHLORIDE 0.9% FLUSH
10.0000 mL | Freq: Two times a day (BID) | INTRAVENOUS | Status: DC
  Administered 2019-01-26 – 2019-01-27 (×3): 10 mL
  Administered 2019-01-28 (×2): 20 mL
  Administered 2019-01-29 – 2019-02-01 (×7): 10 mL
  Administered 2019-02-02: 20 mL

## 2019-01-26 MED ORDER — DIVALPROEX SODIUM 125 MG PO CSDR
250.0000 mg | DELAYED_RELEASE_CAPSULE | Freq: Three times a day (TID) | ORAL | Status: DC
  Administered 2019-01-26 – 2019-02-02 (×12): 250 mg via ORAL
  Filled 2019-01-26 (×14): qty 2

## 2019-01-26 MED ORDER — POTASSIUM CHLORIDE CRYS ER 20 MEQ PO TBCR
40.0000 meq | EXTENDED_RELEASE_TABLET | Freq: Four times a day (QID) | ORAL | Status: AC
  Administered 2019-01-26: 40 meq via ORAL
  Filled 2019-01-26 (×2): qty 2

## 2019-01-26 MED ORDER — CHLORHEXIDINE GLUCONATE CLOTH 2 % EX PADS
6.0000 | MEDICATED_PAD | Freq: Every day | CUTANEOUS | Status: DC
  Administered 2019-01-26 – 2019-02-02 (×7): 6 via TOPICAL

## 2019-01-26 NOTE — Progress Notes (Addendum)
Patient in bed, refused to open mouth to take some of her morning medications.Patient still only taking in a minimal amount of food, will continue to encourage her to eat, no s/s of pain or distress.  Called son Linton Rump for update, left voicemail for him to return call. Will continue to monitor for remainder of shift.

## 2019-01-26 NOTE — Progress Notes (Signed)
Pt has been on abx to r/o UTI. Culture came back neg today. Today is D4 abx. D/w Dr. Waldron Labs and we will stop abx after today's dose.   Onnie Boer, PharmD, BCIDP, AAHIVP, CPP Infectious Disease Pharmacist 01/26/2019 12:17 PM

## 2019-01-26 NOTE — Progress Notes (Addendum)
PROGRESS NOTE  Teuta Jarred [id number] DOB: 06-11-1940 DOA: 01/25/2019 PCP: Marco Collie, MD   LOS: 1 day   Brief Narrative / Interim history:  78 year old female with history of hypertension, dementia, prior CVA and poorly verbal at baseline, hypertension, diabetes mellitus, admitted from SNF on 01/23/2021 elements with shortness of breath, dehydration, UTI and was found also to be Covid positive.  Chest x-ray on admission showed multifocal pneumonia.  She was also hyponatremic with a sodium of 160.  She was transferred to [facility] on 10/20  Subjective / 24h Interval events:  Alert, significantly demented, does not answer questions appropriately, does not follow commands, no significant events overnight per staff .  Assessment & Plan: Principal Problem:   Pneumonia due to COVID-19 virus Active Problems:   Type 2 diabetes mellitus (H[facility])   Dementia (H[facility])   Abdominal pain, chronic, epigastric   UTI (urinary tract infection)   Acute hypoxic respiratory failure due to COVID-19 pneumonia -Patient remains on 2 L nasal cannula today, she is significantly demented, I want think she will be able to follow incentive spirometry or to prone. -Continue with steroids -Continue with remdesivir -Continue to follow inflammatory markers closely, CRP trending down, D-dimers trending down, but remains significantly elevated.  T prophylaxis per COVID-19 protocol.  COVID-19 Labs  Recent Labs    01/24/19 0750 01/25/19 0715 01/26/19 0230  DDIMER  --  6.66* 5.12*  CRP 9.7* 9.5* 6.3*    Lab Results  Component Value Date   SARSCOV2NAA POSITIVE (A) 01/23/2019    Type 2 diabetes mellitus -CBG appear to be acceptable on sliding scale -Hold home oral agents, Metformin and Januvia.  CBG (last 3)  Recent Labs    01/25/19 1620 01/25/19 2046 01/26/19 0759  GLUCAP 271* 285* 186*   Nonsustained VT/SVT -Most recent 2D echo showed an EF of 60 to 123456, grade 1 diastolic dysfunction, this  was back in 2018.  -Correct hypokalemia . - continue to monitor on telemetry -Started on low-dose metoprolol  Hypokalemia -Repleted, recheck in a.m.  UTI -Urinalysis at [facility] with evidence of infection, continue ceftriaxone, monitor urine cultures  Hypernatremia -Likely secondary to dehydration, sodium this morning is 148, will encourage to increase fluid intake.  Prior CVA -Resume home aspirin  Dementia -Resume Aricept and Namenda. -Unclear if she is on Depakote, will discuss with pharmacy, will resume later if she is supposed to be on it.  Hypertension -On metoprolol as above, hold losartan  Scheduled Meds: . aspirin EC  81 mg Oral Daily  . atorvastatin  40 mg Oral Daily  . dexamethasone (DECADRON) injection  6 mg Intravenous Q24H  . donepezil  5 mg Oral QHS  . enoxaparin (LOVENOX) injection  30 mg Subcutaneous BID  . feeding supplement (ENSURE ENLIVE)  237 mL Oral TID BM  . insulin aspart  0-9 Units Subcutaneous TID WC  . memantine  5 mg Oral BID  . metoprolol tartrate  12.5 mg Oral BID  . multivitamin with minerals  1 tablet Oral Daily  . potassium chloride  40 mEq Oral Q6H  . sodium chloride flush  3 mL Intravenous Q12H  . vitamin C  500 mg Oral Daily  . zinc sulfate  220 mg Oral Daily   Continuous Infusions: . sodium chloride    . sodium chloride 50 mL/hr at 01/25/19 1217  . cefTRIAXone (ROCEPHIN)  IV 1 g (01/25/19 1100)  . remdesivir 100 mg in NS 250 mL 100 mg (01/25/19 1216)   PRN Meds:.sodium chloride,  acetaminophen, chlorpheniramine-HYDROcodone, guaiFENesin-dextromethorphan, ondansetron **OR** ondansetron (ZOFRAN) IV, sodium chloride flush  DVT prophylaxis: Lovenox Code Status: DNR Family Communication: I have discussed with son today, he has confirmed [place] is DNR and not full code, so she was changed to DNR. Disposition Plan: TBD  Consultants:  None   Procedures:  None   Microbiology  None   Antimicrobials: Ceftriaxone      Objective: Vitals:   01/25/19 1621 01/25/19 2032 01/26/19 0448 01/26/19 0730  BP: 121/70 (!) 106/55 (!) 130/91 101/77  Pulse: 79 80  72  Resp: 15 17    Temp: 99.2 F (37.3 C) 97.7 F (36.5 C)  97.7 F (36.5 C)  TempSrc: Oral Axillary  Oral  SpO2: 98% 95%  94%  Weight:        Intake/Output Summary (Last 24 hours) at 01/26/2019 1049 Last data filed at 01/26/2019 0508 Gross per 24 hour  Intake 483 ml  Output 1 ml  Net 482 ml   Filed Weights   01/25/19 0510  Weight: 59.4 kg    Examination:  Awake, demented, does not answer any questions appropriately, does not follow commands, frail. Symmetrical Chest wall movement, Good air movement bilaterally, CTAB RRR,No Gallops,Rubs or new Murmurs, No Parasternal Heave +ve B.Sounds, Abd Soft, No tenderness, No rebound - guarding or rigidity. No Cyanosis, Clubbing or edema, No new Rash or bruise      Data Reviewed: I have independently reviewed following labs and imaging studies   CBC: Recent Labs  Lab 01/23/19 0915 01/24/19 0538 01/25/19 0715 01/26/19 0230  WBC 10.8* 12.3* 11.9* 7.0  NEUTROABS 8.0*  --   --  5.5  HGB 15.3* 13.0 12.0 11.0*  HCT 48.1* 42.9 39.2 35.7*  MCV 88.6 92.5 92.5 91.1  PLT 223 194 171 [id number]   Basic Metabolic Panel: Recent Labs  Lab 01/23/19 0915 01/24/19 0538 01/25/19 0715 01/26/19 0230  NA 155* 160* 155* 148*  K 4.2 3.0* 3.7 3.1*  CL 113* 120* 121* 115*  CO2 28 30 24 23   GLUCOSE 195* 204* 134* 261*  BUN 50* 35* 29* 35*  CREATININE 0.77 0.52 0.64 0.54  CALCIUM 9.7 8.9 9.0 8.3*  MG  --   --  1.8  --    GFR: Estimated Creatinine Clearance: 52.2 mL/min (by C-G formula based on SCr of 0.54 mg/dL). Liver Function Tests: Recent Labs  Lab 01/23/19 0915 01/25/19 0715 01/26/19 0230  AST 32 31 24  ALT 16 17 15   ALKPHOS 37* 38 36*  BILITOT 1.6* 1.4* 0.9  PROT 8.4* 6.8 5.8*  ALBUMIN 3.2* 2.6* 2.3*   No results for input(s): LIPASE, AMYLASE in the last 168 hours. No results for input(s):  AMMONIA in the last 168 hours. Coagulation Profile: No results for input(s): INR, PROTIME in the last 168 hours. Cardiac Enzymes: No results for input(s): CKTOTAL, CKMB, CKMBINDEX, TROPONINI in the last 168 hours. BNP (last 3 results) No results for input(s): PROBNP in the last 8760 hours. HbA1C: Recent Labs    01/25/19 0715  HGBA1C 7.1*   CBG: Recent Labs  Lab 01/25/19 0818 01/25/19 1230 01/25/19 1620 01/25/19 2046 01/26/19 0759  GLUCAP 108* 126* 271* 285* 186*   Lipid Profile: No results for input(s): CHOL, HDL, LDLCALC, TRIG, CHOLHDL, LDLDIRECT in the last 72 hours. Thyroid Function Tests: No results for input(s): TSH, T4TOTAL, FREET4, T3FREE, THYROIDAB in the last 72 hours. Anemia Panel: No results for input(s): VITAMINB12, FOLATE, FERRITIN, TIBC, IRON, RETICCTPCT in the last 72 hours. Urine analysis:  Component Value Date/Time   COLORURINE AMBER (A) 01/23/2019 1031   APPEARANCEUR CLOUDY (A) 01/23/2019 1031   APPEARANCEUR Clear 02/25/2014 1352   LABSPEC 1.021 01/23/2019 1031   LABSPEC 1.015 02/25/2014 1352   PHURINE 5.0 01/23/2019 1031   GLUCOSEU NEGATIVE 01/23/2019 1031   GLUCOSEU Negative 02/25/2014 1352   HGBUR SMALL (A) 01/23/2019 1031   BILIRUBINUR NEGATIVE 01/23/2019 1031   BILIRUBINUR negative 08/22/2016 0900   BILIRUBINUR Negative 02/25/2014 1352   KETONESUR NEGATIVE 01/23/2019 1031   PROTEINUR 100 (A) 01/23/2019 1031   UROBILINOGEN 0.2 08/22/2016 0900   UROBILINOGEN 1.0 07/15/2010 0827   NITRITE POSITIVE (A) 01/23/2019 1031   LEUKOCYTESUR MODERATE (A) 01/23/2019 1031   LEUKOCYTESUR 1+ 02/25/2014 1352   Sepsis Labs: Invalid input(s): PROCALCITONIN, LACTICIDVEN  Recent Results (from the past 240 hour(s))  SARS CORONAVIRUS 2 (TAT 6-24 HRS) Nasopharyngeal Nasopharyngeal Swab     Status: Abnormal   Collection Time: 01/23/19  9:15 AM   Specimen: Nasopharyngeal Swab  Result Value Ref Range Status   SARS Coronavirus 2 POSITIVE (A) NEGATIVE Final     Comment: RESULT CALLED TO, READ BACK BY AND VERIFIED WITH: DARLENE LISTOPAD RN.@1800  ON 10.18.2020 BY TCALDWELL MT. (NOTE) SARS-CoV-2 target nucleic acids are DETECTED. The SARS-CoV-2 RNA is generally detectable in upper and lower respiratory specimens during the acute phase of infection. Positive results are indicative of active infection with SARS-CoV-2. Clinical  correlation with patient history and other diagnostic information is necessary to determine patient infection status. Positive results do  not rule out bacterial infection or co-infection with other viruses. The expected result is Negative. Fact Sheet for Patients: SugarRoll.be Fact Sheet for Healthcare Providers: https://www.woods-mathews.com/ This test is not yet approved or cleared by the [place] FDA and  has been authorized for detection and/or diagnosis of SARS-CoV-2 by FDA under an Emergency Use Authorization (EUA). This EUA will remain  in effect (meaning this te st can be used) for the duration of the COVID-19 declaration under Section 564(b)(1) of the Act, 21 U.S.C. section 360bbb-3(b)(1), unless the authorization is terminated or revoked sooner. Performed at [facility], [place] [address] [place], [place] [address]   Culture, blood (routine x 2)     Status: None (Preliminary result)   Collection Time: 01/23/19 11:38 AM   Specimen: BLOOD  Result Value Ref Range Status   Specimen Description BLOOD LEFT ANTECUBITAL  Final   Special Requests   Final    BOTTLES DRAWN AEROBIC AND ANAEROBIC Blood Culture adequate volume   Culture   Final    NO GROWTH 3 DAYS Performed at [facility], [address], [place], [place] [address]    Report Status PENDING  Incomplete  Culture, blood (routine x 2)     Status: None (Preliminary result)   Collection Time: 01/23/19 11:38 AM   Specimen: BLOOD  Result Value Ref Range Status   Specimen Description BLOOD RIGHT  ANTECUBITAL  Final   Special Requests   Final    BOTTLES DRAWN AEROBIC AND ANAEROBIC Blood Culture adequate volume   Culture   Final    NO GROWTH 3 DAYS Performed at [facility], [address] [place], [place] [address]    Report Status PENDING  Incomplete  MRSA PCR Screening     Status: None   Collection Time: 01/23/19  3:54 PM   Specimen: Nasal Mucosa; Nasopharyngeal  Result Value Ref Range Status   MRSA by PCR NEGATIVE NEGATIVE Final    Comment:  The GeneXpert MRSA Assay (FDA approved for NASAL specimens only), is one component of a comprehensive MRSA colonization surveillance program. It is not intended to diagnose MRSA infection nor to guide or monitor treatment for MRSA infections. Performed at [facility], [address] [place], [place] [address]   Culture, Urine     Status: None   Collection Time: 01/25/19  6:30 AM   Specimen: Urine, Random  Result Value Ref Range Status   Specimen Description   Final    URINE, RANDOM Performed at [place] [address], [place], [place] [address]    Special Requests   Final    NONE Performed at [facility], [place] [address] [place], [place] [address]    Culture   Final    NO GROWTH Performed at [facility], [place] [address], [place], [place] [address]    Report Status 01/26/2019 FINAL  Final      Radiology Studies: No results found.  Phillips Climes MD [facility]  Contact via  [email or web address]  [place] P: [phone] F: [phone]

## 2019-01-26 NOTE — Progress Notes (Signed)
Called Maurice(son) to give updates/status on pt.  No answer.  Left message to return call.

## 2019-01-27 LAB — GLUCOSE, CAPILLARY
Glucose-Capillary: 171 mg/dL — ABNORMAL HIGH (ref 70–99)
Glucose-Capillary: 208 mg/dL — ABNORMAL HIGH (ref 70–99)
Glucose-Capillary: 177 mg/dL — ABNORMAL HIGH (ref 70–99)
Glucose-Capillary: 219 mg/dL — ABNORMAL HIGH (ref 70–99)

## 2019-01-27 NOTE — Progress Notes (Signed)
PROGRESS NOTE  Lisa Hahn [id number] DOB: [birth year] DOA: 01/25/2019 PCP: Marco Collie, MD   LOS: 2 days   Brief Narrative / Interim history:  78 year old female with history of hypertension, dementia, prior CVA and poorly verbal at baseline, hypertension, diabetes mellitus, admitted from SNF on 01/23/2021 elements with shortness of breath, dehydration, UTI and was found also to be Covid positive.  Chest x-ray on admission showed multifocal pneumonia.  She was also hyponatremic with a sodium of 160.  She was transferred to [facility] on 10/20  Subjective / 24h Interval events:  Alert, significantly demented, does not answer questions appropriately, does not follow commands, no significant events overnight per staff .  Assessment & Plan: Principal Problem:   Pneumonia due to COVID-19 virus Active Problems:   Type 2 diabetes mellitus (HCC)   Dementia (HCC)   Abdominal pain, chronic, epigastric   UTI (urinary tract infection)   Acute hypoxic respiratory failure due to COVID-19 pneumonia -She is on 3 L nasal cannula today, up 1 L from yesterday, significantly demented, unable to follow instruction regarding incentive spirometry or proning . -Continue with steroids -Continue with remdesivir -Continue to follow inflammatory markers closely, CRP trending down, D-dimers trending down, but remains significantly elevated.   -DVT prophylaxis per COVID-19 protocol.  COVID-19 Labs  Recent Labs    01/25/19 0715 01/26/19 0230  DDIMER 6.66* 5.12*  CRP 9.5* 6.3*    Lab Results  Component Value Date   SARSCOV2NAA POSITIVE (A) 01/23/2019    Type 2 diabetes mellitus -CBG appear to be acceptable on sliding scale -Hold home oral agents, Metformin and Januvia.  CBG (last 3)  Recent Labs    01/26/19 2106 01/27/19 0755 01/27/19 1116  GLUCAP 287* 208* 177*   Nonsustained VT/SVT -Most recent 2D echo showed an EF of 60 to 123456, grade 1 diastolic dysfunction, this was back in  2018.  -Correct hypokalemia . - continue to monitor on telemetry -Started on low-dose metoprolol  Hypokalemia -Repleted, will follow on labs today  UTI -Urinalysis at [place] with evidence of infection, he did with Rocephin,  Hypernatremia -Likely secondary to dehydration, follow on repeat level today  Prior CVA -Resume home aspirin  Dementia -Resume Aricept and Namenda. -Unclear if she is on Depakote, but it was on her medication at facility, it will be resumed  Hypertension -On metoprolol as above, hold losartan  Scheduled Meds: . aspirin EC  81 mg Oral Daily  . atorvastatin  40 mg Oral Daily  . Chlorhexidine Gluconate Cloth  6 each Topical Daily  . dexamethasone (DECADRON) injection  6 mg Intravenous Q24H  . divalproex  250 mg Oral TID  . donepezil  5 mg Oral QHS  . enoxaparin (LOVENOX) injection  30 mg Subcutaneous BID  . feeding supplement (ENSURE ENLIVE)  237 mL Oral TID BM  . insulin aspart  0-9 Units Subcutaneous TID WC  . memantine  5 mg Oral BID  . metoprolol tartrate  12.5 mg Oral BID  . multivitamin with minerals  1 tablet Oral Daily  . sodium chloride flush  10-40 mL Intracatheter Q12H  . sodium chloride flush  3 mL Intravenous Q12H  . vitamin C  500 mg Oral Daily  . zinc sulfate  220 mg Oral Daily   Continuous Infusions: . sodium chloride    . remdesivir 100 mg in NS 250 mL 100 mg (01/27/19 0918)   PRN Meds:.sodium chloride, acetaminophen, chlorpheniramine-HYDROcodone, guaiFENesin-dextromethorphan, ondansetron **OR** ondansetron (ZOFRAN) IV, sodium chloride flush, sodium chloride flush  DVT prophylaxis: Lovenox Code Status: DNR Family Communication: Cussed with son via phone yesterday, will update again today. Disposition Plan: TBD  Consultants:  None   Procedures:  None   Microbiology  None   Antimicrobials: Ceftriaxone     Objective: Vitals:   01/26/19 1600 01/26/19 1953 01/27/19 0343 01/27/19 0700  BP: 131/74 130/62  (!) 130/91   Pulse: 84   80  Resp: 17 17 17 18   Temp: 97.6 F (36.4 C)   98.2 F (36.8 C)  TempSrc: Axillary Oral  Oral  SpO2: 99% 96%  96%  Weight:        Intake/Output Summary (Last 24 hours) at 01/27/2019 1406 Last data filed at 01/27/2019 [id number] Gross per 24 hour  Intake 10 ml  Output -  Net 10 ml   Filed Weights   01/25/19 0510  Weight: 59.4 kg    Examination:  Awake, demented, does not answer any questions appropriately, does not follow commands, frail. Symmetrical Chest wall movement, Good air movement bilaterally, CTAB RRR,No Gallops,Rubs or new Murmurs, No Parasternal Heave +ve B.Sounds, Abd Soft, No tenderness, No rebound - guarding or rigidity. No Cyanosis, Clubbing or edema, No new Rash or bruise       Data Reviewed: I have independently reviewed following labs and imaging studies   CBC: Recent Labs  Lab 01/23/19 0915 01/24/19 0538 01/25/19 0715 01/26/19 0230  WBC 10.8* 12.3* 11.9* 7.0  NEUTROABS 8.0*  --   --  5.5  HGB 15.3* 13.0 12.0 11.0*  HCT 48.1* 42.9 39.2 35.7*  MCV 88.6 92.5 92.5 91.1  PLT 223 194 171 123456   Basic Metabolic Panel: Recent Labs  Lab 01/23/19 0915 01/24/19 0538 01/25/19 0715 01/26/19 0230  NA 155* 160* 155* 148*  K 4.2 3.0* 3.7 3.1*  CL 113* 120* 121* 115*  CO2 28 30 24 23   GLUCOSE 195* 204* 134* 261*  BUN 50* 35* 29* 35*  CREATININE 0.77 0.52 0.64 0.54  CALCIUM 9.7 8.9 9.0 8.3*  MG  --   --  1.8  --    GFR: Estimated Creatinine Clearance: 52.2 mL/min (by C-G formula based on SCr of 0.54 mg/dL). Liver Function Tests: Recent Labs  Lab 01/23/19 0915 01/25/19 0715 01/26/19 0230  AST 32 31 24  ALT 16 17 15   ALKPHOS 37* 38 36*  BILITOT 1.6* 1.4* 0.9  PROT 8.4* 6.8 5.8*  ALBUMIN 3.2* 2.6* 2.3*   No results for input(s): LIPASE, AMYLASE in the last 168 hours. No results for input(s): AMMONIA in the last 168 hours. Coagulation Profile: No results for input(s): INR, PROTIME in the last 168 hours. Cardiac Enzymes: No  results for input(s): CKTOTAL, CKMB, CKMBINDEX, TROPONINI in the last 168 hours. BNP (last 3 results) No results for input(s): PROBNP in the last 8760 hours. HbA1C: Recent Labs    01/25/19 0715  HGBA1C 7.1*   CBG: Recent Labs  Lab 01/26/19 1141 01/26/19 1609 01/26/19 2106 01/27/19 0755 01/27/19 1116  GLUCAP 141* 171* 287* 208* 177*   Lipid Profile: No results for input(s): CHOL, HDL, LDLCALC, TRIG, CHOLHDL, LDLDIRECT in the last 72 hours. Thyroid Function Tests: No results for input(s): TSH, T4TOTAL, FREET4, T3FREE, THYROIDAB in the last 72 hours. Anemia Panel: No results for input(s): VITAMINB12, FOLATE, FERRITIN, TIBC, IRON, RETICCTPCT in the last 72 hours. Urine analysis:    Component Value Date/Time   COLORURINE AMBER (A) 01/23/2019 1031   APPEARANCEUR CLOUDY (A) 01/23/2019 1031   APPEARANCEUR Clear 02/25/2014 1352   LABSPEC  1.021 01/23/2019 1031   LABSPEC 1.015 02/25/2014 1352   PHURINE 5.0 01/23/2019 1031   GLUCOSEU NEGATIVE 01/23/2019 1031   GLUCOSEU Negative 02/25/2014 1352   HGBUR SMALL (A) 01/23/2019 1031   BILIRUBINUR NEGATIVE 01/23/2019 1031   BILIRUBINUR negative 08/22/2016 0900   BILIRUBINUR Negative 02/25/2014 1352   KETONESUR NEGATIVE 01/23/2019 1031   PROTEINUR 100 (A) 01/23/2019 1031   UROBILINOGEN 0.2 08/22/2016 0900   UROBILINOGEN 1.0 07/15/2010 0827   NITRITE POSITIVE (A) 01/23/2019 1031   LEUKOCYTESUR MODERATE (A) 01/23/2019 1031   LEUKOCYTESUR 1+ 02/25/2014 1352   Sepsis Labs: Invalid input(s): PROCALCITONIN, LACTICIDVEN  Recent Results (from the past 240 hour(s))  SARS CORONAVIRUS 2 (TAT 6-24 HRS) Nasopharyngeal Nasopharyngeal Swab     Status: Abnormal   Collection Time: 01/23/19  9:15 AM   Specimen: Nasopharyngeal Swab  Result Value Ref Range Status   SARS Coronavirus 2 POSITIVE (A) NEGATIVE Final    Comment: RESULT CALLED TO, READ BACK BY AND VERIFIED WITH: DARLENE LISTOPAD RN.@1800  ON 10.18.2020 BY TCALDWELL MT. (NOTE) SARS-CoV-2  target nucleic acids are DETECTED. The SARS-CoV-2 RNA is generally detectable in upper and lower respiratory specimens during the acute phase of infection. Positive results are indicative of active infection with SARS-CoV-2. Clinical  correlation with patient history and other diagnostic information is necessary to determine patient infection status. Positive results do  not rule out bacterial infection or co-infection with other viruses. The expected result is Negative. Fact Sheet for Patients: SugarRoll.be Fact Sheet for Healthcare Providers: https://www.woods-mathews.com/ This test is not yet approved or cleared by the [place] FDA and  has been authorized for detection and/or diagnosis of SARS-CoV-2 by FDA under an Emergency Use Authorization (EUA). This EUA will remain  in effect (meaning this te st can be used) for the duration of the COVID-19 declaration under Section 564(b)(1) of the Act, 21 U.S.C. section 360bbb-3(b)(1), unless the authorization is terminated or revoked sooner. Performed at [facility], [place] [address] [place], [place] [address]   Culture, blood (routine x 2)     Status: None (Preliminary result)   Collection Time: 01/23/19 11:38 AM   Specimen: BLOOD  Result Value Ref Range Status   Specimen Description BLOOD LEFT ANTECUBITAL  Final   Special Requests   Final    BOTTLES DRAWN AEROBIC AND ANAEROBIC Blood Culture adequate volume   Culture   Final    NO GROWTH 4 DAYS Performed at [facility], [address] [place], [place] [address]    Report Status PENDING  Incomplete  Culture, blood (routine x 2)     Status: None (Preliminary result)   Collection Time: 01/23/19 11:38 AM   Specimen: BLOOD  Result Value Ref Range Status   Specimen Description BLOOD RIGHT ANTECUBITAL  Final   Special Requests   Final    BOTTLES DRAWN AEROBIC AND ANAEROBIC Blood Culture adequate volume   Culture   Final     NO GROWTH 4 DAYS Performed at [facility], [address], [place], [place] [address]    Report Status PENDING  Incomplete  MRSA PCR Screening     Status: None   Collection Time: 01/23/19  3:54 PM   Specimen: Nasal Mucosa; Nasopharyngeal  Result Value Ref Range Status   MRSA by PCR NEGATIVE NEGATIVE Final    Comment:        The GeneXpert MRSA Assay (FDA approved for NASAL specimens only), is one component of a comprehensive MRSA colonization surveillance program. It  is not intended to diagnose MRSA infection nor to guide or monitor treatment for MRSA infections. Performed at [facility], [address] [place], [place] [address]   Culture, Urine     Status: None   Collection Time: 01/25/19  6:30 AM   Specimen: Urine, Random  Result Value Ref Range Status   Specimen Description   Final    URINE, RANDOM Performed at [place] [address] [place], [place] [address]    Special Requests   Final    NONE Performed at [facility], [place] [address] [place], [place] [address]    Culture   Final    NO GROWTH Performed at [facility], [place] [address], [place], [place] [address]    Report Status 01/26/2019 FINAL  Final      Radiology Studies: [place] Ekg Site Rite  Result Date: 01/26/2019 If Site Rite image not attached, placement could not be confirmed due to current cardiac rhythm.   Phillips Climes MD [facility]  Contact via  [email or web address]  [place] P: [phone] F: [phone]

## 2019-01-28 DIAGNOSIS — E870 Hyperosmolality and hypernatremia: Secondary | ICD-10-CM

## 2019-01-28 DIAGNOSIS — E876 Hypokalemia: Secondary | ICD-10-CM

## 2019-01-28 LAB — GLUCOSE, CAPILLARY
Glucose-Capillary: 229 mg/dL — ABNORMAL HIGH (ref 70–99)
Glucose-Capillary: 179 mg/dL — ABNORMAL HIGH (ref 70–99)
Glucose-Capillary: 203 mg/dL — ABNORMAL HIGH (ref 70–99)
Glucose-Capillary: 276 mg/dL — ABNORMAL HIGH (ref 70–99)
Glucose-Capillary: 355 mg/dL — ABNORMAL HIGH (ref 70–99)

## 2019-01-28 LAB — BASIC METABOLIC PANEL
Anion gap: 6 (ref 5–15)
BUN: 24 mg/dL — ABNORMAL HIGH (ref 8–23)
CO2: 26 mmol/L (ref 22–32)
Calcium: 8.5 mg/dL — ABNORMAL LOW (ref 8.9–10.3)
Chloride: 118 mmol/L — ABNORMAL HIGH (ref 98–111)
Creatinine, Ser: 0.45 mg/dL (ref 0.44–1.00)
GFR calc Af Amer: >60 mL/min (ref >60)
GFR calc non Af Amer: >60 mL/min (ref >60)
Glucose, Bld: 332 mg/dL — ABNORMAL HIGH (ref 70–99)
Potassium: 4.2 mmol/L (ref 3.5–5.1)
Sodium: 150 mmol/L — ABNORMAL HIGH (ref 135–145)

## 2019-01-28 LAB — COMPREHENSIVE METABOLIC PANEL
ALT: 72 U/L — ABNORMAL HIGH (ref 0–44)
AST: 105 U/L — ABNORMAL HIGH (ref 15–41)
Albumin: 2.2 g/dL — ABNORMAL LOW (ref 3.5–5.0)
Alkaline Phosphatase: 40 U/L (ref 38–126)
Anion gap: 7 (ref 5–15)
BUN: 25 mg/dL — ABNORMAL HIGH (ref 8–23)
CO2: 26 mmol/L (ref 22–32)
Calcium: 8.6 mg/dL — ABNORMAL LOW (ref 8.9–10.3)
Chloride: 122 mmol/L — ABNORMAL HIGH (ref 98–111)
Creatinine, Ser: 0.47 mg/dL (ref 0.44–1.00)
GFR calc Af Amer: >60 mL/min (ref >60)
GFR calc non Af Amer: >60 mL/min (ref >60)
Glucose, Bld: 177 mg/dL — ABNORMAL HIGH (ref 70–99)
Potassium: 2.7 mmol/L — CL (ref 3.5–5.1)
Sodium: 155 mmol/L — ABNORMAL HIGH (ref 135–145)
Total Bilirubin: 1 mg/dL (ref 0.3–1.2)
Total Protein: 5.6 g/dL — ABNORMAL LOW (ref 6.5–8.1)

## 2019-01-28 LAB — CULTURE, BLOOD (ROUTINE X 2)
Culture: NO GROWTH
Culture: NO GROWTH
Special Requests: ADEQUATE
Special Requests: ADEQUATE

## 2019-01-28 LAB — CBC WITH DIFFERENTIAL/PLATELET
Abs Immature Granulocytes: 0.08 10*3/uL — ABNORMAL HIGH (ref 0.00–0.07)
Basophils Absolute: 0 10*3/uL (ref 0.0–0.1)
Basophils Relative: 0 %
Eosinophils Absolute: 0.1 10*3/uL (ref 0.0–0.5)
Eosinophils Relative: 1 %
HCT: 35.9 % — ABNORMAL LOW (ref 36.0–46.0)
Hemoglobin: 11.4 g/dL — ABNORMAL LOW (ref 12.0–15.0)
Immature Granulocytes: 1 %
Lymphocytes Relative: 22 %
Lymphs Abs: 1.9 10*3/uL (ref 0.7–4.0)
MCH: 28.1 pg (ref 26.0–34.0)
MCHC: 31.8 g/dL (ref 30.0–36.0)
MCV: 88.4 fL (ref 80.0–100.0)
Monocytes Absolute: 0.5 10*3/uL (ref 0.1–1.0)
Monocytes Relative: 6 %
Neutro Abs: 6 10*3/uL (ref 1.7–7.7)
Neutrophils Relative %: 70 %
Platelets: 227 10*3/uL (ref 150–400)
RBC: 4.06 MIL/uL (ref 3.87–5.11)
RDW: 16.2 % — ABNORMAL HIGH (ref 11.5–15.5)
WBC: 8.6 10*3/uL (ref 4.0–10.5)
nRBC: 0 % (ref 0.0–0.2)

## 2019-01-28 LAB — MAGNESIUM: Magnesium: 1.8 mg/dL (ref 1.7–2.4)

## 2019-01-28 LAB — D-DIMER, QUANTITATIVE: D-Dimer, Quant: 2.91 ug/mL-FEU — ABNORMAL HIGH (ref 0.00–0.50)

## 2019-01-28 LAB — PHOSPHORUS: Phosphorus: 2.3 mg/dL — ABNORMAL LOW (ref 2.5–4.6)

## 2019-01-28 LAB — C-REACTIVE PROTEIN: CRP: 4.6 mg/dL — ABNORMAL HIGH (ref <1.0)

## 2019-01-28 LAB — PROCALCITONIN: Procalcitonin: <0.1 ng/mL

## 2019-01-28 MED ORDER — POTASSIUM CL IN DEXTROSE 5% 20 MEQ/L IV SOLN
20.0000 meq | INTRAVENOUS | Status: DC
  Administered 2019-01-28 (×2): 20 meq via INTRAVENOUS
  Filled 2019-01-28 (×2): qty 1000

## 2019-01-28 MED ORDER — INSULIN ASPART 100 UNIT/ML ~~LOC~~ SOLN
10.0000 [IU] | Freq: Once | SUBCUTANEOUS | Status: AC
  Administered 2019-01-28: 10 [IU] via SUBCUTANEOUS

## 2019-01-28 MED ORDER — POTASSIUM CHLORIDE 10 MEQ/100ML IV SOLN
10.0000 meq | INTRAVENOUS | Status: AC
  Administered 2019-01-28 (×4): 10 meq via INTRAVENOUS
  Filled 2019-01-28 (×3): qty 100

## 2019-01-28 MED ORDER — POTASSIUM CHLORIDE CRYS ER 20 MEQ PO TBCR
40.0000 meq | EXTENDED_RELEASE_TABLET | Freq: Two times a day (BID) | ORAL | Status: DC
  Administered 2019-01-28 (×2): 40 meq via ORAL
  Filled 2019-01-28 (×3): qty 2

## 2019-01-28 MED ORDER — ENOXAPARIN SODIUM 40 MG/0.4ML ~~LOC~~ SOLN
40.0000 mg | SUBCUTANEOUS | Status: DC
  Administered 2019-01-28 – 2019-02-02 (×6): 40 mg via SUBCUTANEOUS
  Filled 2019-01-28 (×5): qty 0.4

## 2019-01-28 MED ORDER — POTASSIUM CHLORIDE CRYS ER 20 MEQ PO TBCR: 20.0000 meq | EXTENDED_RELEASE_TABLET | Freq: Two times a day (BID) | ORAL | Status: DC

## 2019-01-28 MED ORDER — MAGNESIUM SULFATE IN D5W 1-5 GM/100ML-% IV SOLN
1.0000 g | Freq: Once | INTRAVENOUS | Status: AC
  Administered 2019-01-28: 1 g via INTRAVENOUS
  Filled 2019-01-28: qty 100

## 2019-01-28 MED ORDER — POTASSIUM PHOSPHATES 15 MMOLE/5ML IV SOLN
20.0000 mmol | Freq: Once | INTRAVENOUS | Status: AC
  Administered 2019-01-28: 20 mmol via INTRAVENOUS
  Filled 2019-01-28: qty 6.67

## 2019-01-28 NOTE — Progress Notes (Signed)
Physical Therapy Treatment Patient Details Name: Lisa Hahn MRN: [id number] DOB: Aug 08, 1940 Today's Date: 01/28/2019    History of Present Illness 78 year old female with history of hypertension, dementia, prior CVA and poorly verbal at baseline, hypertension, diabetes mellitus, admitted from SNF on 01/23/2021 elements with shortness of breath, dehydration, UTI and was found also to be Covid positive.  Chest x-ray on admission showed multifocal pneumonia.  She was also hyponatremic with a sodium of 160.  She was transferred to [facility] on 10/20    PT Comments    Returned to assist pt to recliner as instructed by physician. Pt still needing max-total a with functional mobility. She is minimally able to communicate with therapist and minimally able to assist with mobility. Pt remains on 2L/min via [place] and sats do remain in 80s and 90s, with transfer noted small desat to 84% but was able to recover to high 80s and 90s once situated in recliner.     Follow Up Recommendations  SNF     Equipment Recommendations  None recommended by PT    Recommendations for Other Services       Precautions / Restrictions Precautions Precautions: Fall;Other (comment) Precaution Comments: cognition Restrictions Weight Bearing Restrictions: No    Mobility  Bed Mobility Overal bed mobility: Needs Assistance Bed Mobility: Rolling;Supine to Sit;Sit to Supine Rolling: Max assist   Supine to sit: Max assist Sit to supine: Max assist   General bed mobility comments: OOB in chair  Transfers Overall transfer level: Needs assistance Equipment used: 1 person hand held assist Transfers: Sit to/from [employer] Transfers   Stand pivot transfers: Max assist;Total assist Squat pivot transfers: Max assist        Ambulation/Gait             General Gait Details: Pt does not ambulate   Stairs             Wheelchair Mobility    Modified Rankin (Stroke Patients Only)        Balance Overall balance assessment: Needs assistance Sitting-balance support: Bilateral upper extremity supported;Feet unsupported Sitting balance-Leahy Scale: Poor Sitting balance - Comments: minimally able to support herself sitting EOB but very briefly then falls back and to left Postural control: Posterior lean;Right lateral lean   Standing balance-Leahy Scale: Zero Standing balance comment: stand with max to total a                            Cognition Arousal/Alertness: Lethargic Behavior During Therapy: Flat affect;Restless Overall Cognitive Status: History of cognitive impairments - at baseline                                 General Comments: most likely close to baeline      Exercises      General Comments General comments (skin integrity, edema, etc.): Sat EOB sometime with mod-max a to mainatin sitting, pt minimally able to tolerate sitting EOB unsupported for brief periods of time. Worked on sit<>stand with max-total a, was able oto squat pivot to chair and position in seated position. pt tends to roll over and attempt to lay to right, positioned with pillows and blankets to stay in midline. seemed to tolerate this while therapist in room. placed on seat alarm.      Pertinent Vitals/Pain Pain Assessment: Faces Faces Pain Scale: Hurts even more Pain Location: with any movement Pain  Descriptors / Indicators: Grimacing;Moaning;Nagging Pain Intervention(s): Limited activity within patient's tolerance;Monitored during session    Home Living Family/patient expects to be discharged to:: Skilled nursing facility                    Prior Function Level of Independence: Needs assistance  Gait / Transfers Assistance Needed: does not ambulate at facility ADL's / Homemaking Assistance Needed: needs assist with all ADLs Comments: total A   PT Goals (current goals can now be found in the care plan section) Acute Rehab PT Goals Patient Stated  Goal: pt unable  Time For Goal Achievement: 02/11/19 Potential to Achieve Goals: Fair    Frequency    Min 2X/week      PT Plan      Co-evaluation              AM-PAC PT "6 Clicks" Mobility   Outcome Measure  Help needed turning from your back to your side while in a flat bed without using bedrails?: A Lot Help needed moving from lying on your back to sitting on the side of a flat bed without using bedrails?: A Lot Help needed moving to and from a bed to a chair (including a wheelchair)?: Total Help needed standing up from a chair using your arms (e.g., wheelchair or bedside chair)?: Total Help needed to walk in hospital room?: Total Help needed climbing 3-5 steps with a railing? : Total 6 Click Score: 8    End of Session Equipment Utilized During Treatment: Oxygen Activity Tolerance: Patient limited by lethargy;Treatment limited secondary to medical complications (Comment);Patient limited by pain;Patient limited by fatigue Patient left: in chair;with call bell/phone within reach;with chair alarm set Nurse Communication: Mobility status PT Visit Diagnosis: Other abnormalities of gait and mobility (R26.89);Muscle weakness (generalized) (M62.81)     Time: [id number] PT Time Calculation (min) (ACUTE ONLY): 34 min  Charges:  $Therapeutic Activity: 23-37 mins                     Horald Chestnut, PT    Delford Field 01/28/2019, 5:09 PM

## 2019-01-28 NOTE — Progress Notes (Signed)
Her ddimer has trended down to <5. Ok to change her lovenox dose to 40mg  SQ qday per Dr Waldron Labs.  Onnie Boer, PharmD, BCIDP, AAHIVP, CPP Infectious Disease Pharmacist 01/28/2019 9:03 AM

## 2019-01-28 NOTE — Progress Notes (Signed)
PROGRESS NOTE  Lisa Hahn [id number] DOB: [birth year] DOA: 01/25/2019 PCP: Marco Collie, MD   LOS: 3 days   Brief Narrative / Interim history:  78 year old female with history of hypertension, dementia, prior CVA and poorly verbal at baseline, hypertension, diabetes mellitus, admitted from SNF on 01/23/2021 elements with shortness of breath, dehydration, UTI and was found also to be Covid positive.  Chest x-ray on admission showed multifocal pneumonia.  She was also hyponatremic with a sodium of 160.  She was transferred to [facility] on 10/20  Subjective / 24h Interval events:  Alert, significantly demented, does not answer questions appropriately, does not follow commands, no significant events overnight per staff .  remains with poor oral intake per staff  Assessment & Plan: Principal Problem:   Pneumonia due to COVID-19 virus Active Problems:   Type 2 diabetes mellitus (HCC)   Dementia (HCC)   Abdominal pain, chronic, epigastric   UTI (urinary tract infection)   Acute hypoxic respiratory failure due to COVID-19 pneumonia -Remains on 2 L nasal cannula today , significantly demented, unable to follow instruction regarding incentive spirometry or proning . -Continue with steroids -Continue with remdesivir -Continue to follow inflammatory markers closely, CRP trending down, D-dimers trending down, but remains significantly elevated.   -DVT prophylaxis per COVID-19 protocol.  COVID-19 Labs  Recent Labs    01/26/19 0230 01/28/19 0440  DDIMER 5.12* 2.91*  CRP 6.3* 4.6*    Lab Results  Component Value Date   SARSCOV2NAA POSITIVE (A) 01/23/2019    Type 2 diabetes mellitus -CBG appear to be acceptable on sliding scale -Hold home oral agents, Metformin and Januvia.  CBG (last 3)  Recent Labs    01/27/19 1614 01/27/19 1956 01/28/19 0850  GLUCAP 219* 229* 179*   Nonsustained VT/SVT -Most recent 2D echo showed an EF of 60 to 123456, grade 1 diastolic dysfunction,  this was back in 2018.  -Correct hypokalemia . - continue to monitor on telemetry -Started on low-dose metoprolol  Hypokalemia -Significantly low today at 2.7, will replete, will give 1 g of magnesium as well.  Hypophosphatemia -Will replete, recheck in a.m.  UTI -Urinalysis at [facility] with evidence of infection, he did with Rocephin,  Hypernatremia -Worsening at 155 today, will start on D5 with potassium supplement, recheck in a.m.  Prior CVA -Resume home aspirin  Transaminitis -due to COVID 19, watch closely especially on remdesivir  Dementia -Resume Aricept and Namenda. -Unclear if she is on Depakote, but it was on her medication at facility, it will be resumed  Hypertension -On metoprolol as above, hold losartan  Scheduled Meds:  aspirin EC  81 mg Oral Daily   atorvastatin  40 mg Oral Daily   Chlorhexidine Gluconate Cloth  6 each Topical Daily   dexamethasone (DECADRON) injection  6 mg Intravenous Q24H   divalproex  250 mg Oral TID   donepezil  5 mg Oral QHS   enoxaparin (LOVENOX) injection  40 mg Subcutaneous Q24H   feeding supplement (ENSURE ENLIVE)  237 mL Oral TID BM   insulin aspart  0-9 Units Subcutaneous TID WC   memantine  5 mg Oral BID   metoprolol tartrate  12.5 mg Oral BID   multivitamin with minerals  1 tablet Oral Daily   potassium chloride  40 mEq Oral BID   sodium chloride flush  10-40 mL Intracatheter Q12H   sodium chloride flush  3 mL Intravenous Q12H   vitamin C  500 mg Oral Daily   zinc sulfate  220  mg Oral Daily   Continuous Infusions:  sodium chloride     dextrose 5 % with KCl 20 mEq / L 20 mEq (01/28/19 0859)   PRN Meds:.sodium chloride, acetaminophen, chlorpheniramine-HYDROcodone, guaiFENesin-dextromethorphan, ondansetron **OR** ondansetron (ZOFRAN) IV, sodium chloride flush, sodium chloride flush  DVT prophylaxis: Lovenox Code Status: DNR Family Communication: Left son a voicemail yesterday, will try again  today Disposition Plan: TBD  Consultants:  None   Procedures:  None   Microbiology  None   Antimicrobials: Ceftriaxone     Objective: Vitals:   01/28/19 0231 01/28/19 0615 01/28/19 0800 01/28/19 1023  BP: 118/66 (!) 141/74 130/66 132/62  Pulse: 61  68 70  Resp: 18 15 15    Temp: 97.8 F (36.6 C)  98 F (36.7 C)   TempSrc: Axillary  Axillary   SpO2:   92%   Weight:        Intake/Output Summary (Last 24 hours) at 01/28/2019 1104 Last data filed at 01/28/2019 1031 Gross per 24 hour  Intake 140 ml  Output --  Net 140 ml   Filed Weights   01/25/19 0510  Weight: 59.4 kg    Examination:  Awake, extremely frail, demented, does not follow commands, does not answer questions appropriately Symmetrical Chest wall movement, Good air movement bilaterally, CTAB RRR,No Gallops,Rubs or new Murmurs, No Parasternal Heave +ve B.Sounds, Abd Soft, No tenderness, No rebound - guarding or rigidity. No Cyanosis, Clubbing or edema, No new Rash or bruise       Data Reviewed: I have independently reviewed following labs and imaging studies   CBC: Recent Labs  Lab 01/23/19 0915 01/24/19 0538 01/25/19 0715 01/26/19 0230 01/28/19 0440  WBC 10.8* 12.3* 11.9* 7.0 8.6  NEUTROABS 8.0*  --   --  5.5 6.0  HGB 15.3* 13.0 12.0 11.0* 11.4*  HCT 48.1* 42.9 39.2 35.7* 35.9*  MCV 88.6 92.5 92.5 91.1 88.4  PLT 223 194 171 213 [id number]   Basic Metabolic Panel: Recent Labs  Lab 01/23/19 0915 01/24/19 0538 01/25/19 0715 01/26/19 0230 01/28/19 0440  NA 155* 160* 155* 148* 155*  K 4.2 3.0* 3.7 3.1* 2.7*  CL 113* 120* 121* 115* 122*  CO2 28 30 24 23 26   GLUCOSE 195* 204* 134* 261* 177*  BUN 50* 35* 29* 35* 25*  CREATININE 0.77 0.52 0.64 0.54 0.47  CALCIUM 9.7 8.9 9.0 8.3* 8.6*  MG  --   --  1.8  --  1.8  PHOS  --   --   --   --  2.3*   GFR: Estimated Creatinine Clearance: 52.2 mL/min (by C-G formula based on SCr of 0.47 mg/dL). Liver Function Tests: Recent Labs  Lab  01/23/19 0915 01/25/19 0715 01/26/19 0230 01/28/19 0440  AST 32 31 24 105*  ALT 16 17 15  72*  ALKPHOS 37* 38 36* 40  BILITOT 1.6* 1.4* 0.9 1.0  PROT 8.4* 6.8 5.8* 5.6*  ALBUMIN 3.2* 2.6* 2.3* 2.2*   No results for input(s): LIPASE, AMYLASE in the last 168 hours. No results for input(s): AMMONIA in the last 168 hours. Coagulation Profile: No results for input(s): INR, PROTIME in the last 168 hours. Cardiac Enzymes: No results for input(s): CKTOTAL, CKMB, CKMBINDEX, TROPONINI in the last 168 hours. BNP (last 3 results) No results for input(s): PROBNP in the last 8760 hours. HbA1C: No results for input(s): HGBA1C in the last 72 hours. CBG: Recent Labs  Lab 01/27/19 0755 01/27/19 1116 01/27/19 1614 01/27/19 1956 01/28/19 0850  GLUCAP 208* 177* 219*  229* 179*   Lipid Profile: No results for input(s): CHOL, HDL, LDLCALC, TRIG, CHOLHDL, LDLDIRECT in the last 72 hours. Thyroid Function Tests: No results for input(s): TSH, T4TOTAL, FREET4, T3FREE, THYROIDAB in the last 72 hours. Anemia Panel: No results for input(s): VITAMINB12, FOLATE, FERRITIN, TIBC, IRON, RETICCTPCT in the last 72 hours. Urine analysis:    Component Value Date/Time   COLORURINE AMBER (A) 01/23/2019 1031   APPEARANCEUR CLOUDY (A) 01/23/2019 1031   APPEARANCEUR Clear 02/25/2014 1352   LABSPEC 1.021 01/23/2019 1031   LABSPEC 1.015 02/25/2014 1352   PHURINE 5.0 01/23/2019 1031   GLUCOSEU NEGATIVE 01/23/2019 1031   GLUCOSEU Negative 02/25/2014 1352   HGBUR SMALL (A) 01/23/2019 1031   BILIRUBINUR NEGATIVE 01/23/2019 1031   BILIRUBINUR negative 08/22/2016 0900   BILIRUBINUR Negative 02/25/2014 1352   KETONESUR NEGATIVE 01/23/2019 1031   PROTEINUR 100 (A) 01/23/2019 1031   UROBILINOGEN 0.2 08/22/2016 0900   UROBILINOGEN 1.0 07/15/2010 0827   NITRITE POSITIVE (A) 01/23/2019 1031   LEUKOCYTESUR MODERATE (A) 01/23/2019 1031   LEUKOCYTESUR 1+ 02/25/2014 1352   Sepsis Labs: Invalid input(s): PROCALCITONIN,  LACTICIDVEN  Recent Results (from the past 240 hour(s))  SARS CORONAVIRUS 2 (TAT 6-24 HRS) Nasopharyngeal Nasopharyngeal Swab     Status: Abnormal   Collection Time: 01/23/19  9:15 AM   Specimen: Nasopharyngeal Swab  Result Value Ref Range Status   SARS Coronavirus 2 POSITIVE (A) NEGATIVE Final    Comment: RESULT CALLED TO, READ BACK BY AND VERIFIED WITH: DARLENE LISTOPAD RN.@1800  ON 10.18.2020 BY TCALDWELL MT. (NOTE) SARS-CoV-2 target nucleic acids are DETECTED. The SARS-CoV-2 RNA is generally detectable in upper and lower respiratory specimens during the acute phase of infection. Positive results are indicative of active infection with SARS-CoV-2. Clinical  correlation with patient history and other diagnostic information is necessary to determine patient infection status. Positive results do  not rule out bacterial infection or co-infection with other viruses. The expected result is Negative. Fact Sheet for Patients: SugarRoll.be Fact Sheet for Healthcare Providers: https://www.woods-mathews.com/ This test is not yet approved or cleared by the [place] FDA and  has been authorized for detection and/or diagnosis of SARS-CoV-2 by FDA under an Emergency Use Authorization (EUA). This EUA will remain  in effect (meaning this te st can be used) for the duration of the COVID-19 declaration under Section 564(b)(1) of the Act, 21 U.S.C. section 360bbb-3(b)(1), unless the authorization is terminated or revoked sooner. Performed at [facility], [place] [address], [place], [place] [address]   Culture, blood (routine x 2)     Status: None   Collection Time: 01/23/19 11:38 AM   Specimen: BLOOD  Result Value Ref Range Status   Specimen Description BLOOD LEFT ANTECUBITAL  Final   Special Requests   Final    BOTTLES DRAWN AEROBIC AND ANAEROBIC Blood Culture adequate volume   Culture   Final    NO GROWTH 5 DAYS Performed at [facility], [address], [place], [place] [address]    Report Status 01/28/2019 FINAL  Final  Culture, blood (routine x 2)     Status: None   Collection Time: 01/23/19 11:38 AM   Specimen: BLOOD  Result Value Ref Range Status   Specimen Description BLOOD RIGHT ANTECUBITAL  Final   Special Requests   Final    BOTTLES DRAWN AEROBIC AND ANAEROBIC Blood Culture adequate volume   Culture   Final    NO GROWTH 5 DAYS Performed at [facility], [place]  [address], [place], [place] [address]    Report Status 01/28/2019 FINAL  Final  MRSA PCR Screening     Status: None   Collection Time: 01/23/19  3:54 PM   Specimen: Nasal Mucosa; Nasopharyngeal  Result Value Ref Range Status   MRSA by PCR NEGATIVE NEGATIVE Final    Comment:        The GeneXpert MRSA Assay (FDA approved for NASAL specimens only), is one component of a comprehensive MRSA colonization surveillance program. It is not intended to diagnose MRSA infection nor to guide or monitor treatment for MRSA infections. Performed at [facility], [place] [place], [place] [address]   Culture, Urine     Status: None   Collection Time: 01/25/19  6:30 AM   Specimen: Urine, Random  Result Value Ref Range Status   Specimen Description   Final    URINE, RANDOM Performed at [place] [address], [place], [place] [address]    Special Requests   Final    NONE Performed at [facility], [place] [address], [place], [place] [address]    Culture   Final    NO GROWTH Performed at [facility], [place] [address] [place],  [address]    Report Status 01/26/2019 FINAL  Final      Radiology Studies: [place] Ekg Site Rite  Result Date: 01/26/2019 If Site Rite image not attached, placement could not be confirmed due to current cardiac rhythm.   Phillips Climes MD [facility]  Contact via  [email or web address]  [place] P: [phone] F:  [phone]

## 2019-01-28 NOTE — Progress Notes (Signed)
Occupational Therapy Evaluation Patient Details Name: Lisa Hahn MRN: [id number] DOB: 1940/05/04 Today's Date: 01/28/2019    History of Present Illness 78 year old female with history of hypertension, dementia, prior CVA and poorly verbal at baseline, hypertension, diabetes mellitus, admitted from SNF on 01/23/2021 elements with shortness of breath, dehydration, UTI and was found also to be Covid positive.  Chest x-ray on admission showed multifocal pneumonia.  She was also hyponatremic with a sodium of 160.  She was transferred to [facility] on 10/20   [place] and [place]. Pt was walking @ 1 month ago, but since that time has been "bedbound and total assist for care". Pt did not follow commands. Could not get pt to self feed/hold cup. Pt overall total A. Will need to be fed all meals.  Defer any OT needs to SNF. OT signing off.     Follow Up Recommendations  SNF;Supervision/Assistance - 24 hour    Equipment Recommendations       Recommendations for Other Services (Palliative)     Precautions / Restrictions Precautions Precautions: Fall      Mobility Bed Mobility               General bed mobility comments: OOB in chair  Transfers                      Balance                                           ADL either performed or assessed with clinical judgement   ADL                                         General ADL Comments: total A     Vision         Perception     Praxis      Pertinent Vitals/Pain Pain Assessment: Faces Faces Pain Scale: Hurts a little bit Pain Location: with any movement Pain Descriptors / Indicators: Grimacing Pain Intervention(s): Limited activity within patient's tolerance     Hand Dominance Right   Extremity/Trunk Assessment Upper Extremity Assessment Upper Extremity Assessment: Generalized weakness   Lower Extremity  Assessment Lower Extremity Assessment: Defer to PT evaluation   Cervical / Trunk Assessment Cervical / Trunk Assessment: Other exceptions   Communication Communication Communication: Expressive difficulties   Cognition Arousal/Alertness: Awake/alert Behavior During Therapy: Flat affect;Restless Overall Cognitive Status: History of cognitive impairments - at baseline                                 General Comments: most likely close to baeline   General Comments       Exercises     Shoulder Instructions      Home Living Family/patient expects to be discharged to:: Skilled nursing facility                                        Prior Functioning/Environment Level of Independence: Needs assistance        Comments: total A        OT  Problem List: Decreased strength;Decreased activity tolerance;Decreased cognition      OT Treatment/Interventions:      OT Goals(Current goals can be found in the care plan section) Acute Rehab OT Goals Patient Stated Goal: pt unable  OT Goal Formulation: All assessment and education complete, DC therapy  OT Frequency:     Barriers to D/C:            Co-evaluation              AM-PAC OT "6 Clicks" Daily Activity     Outcome Measure Help from another person eating meals?: Total Help from another person taking care of personal grooming?: Total Help from another person toileting, which includes using toliet, bedpan, or urinal?: Total Help from another person bathing (including washing, rinsing, drying)?: Total Help from another person to put on and taking off regular upper body clothing?: Total Help from another person to put on and taking off regular lower body clothing?: Total 6 Click Score: 6   End of Session Nurse Communication: Need for lift equipment  Activity Tolerance: Other (comment)(tretment limited by cognitive deficits) Patient left: in chair;with call bell/phone within reach;with  chair alarm set  OT Visit Diagnosis: Muscle weakness (generalized) (M62.81);Other symptoms and signs involving cognitive function;Pain Pain - part of body: (general discomfort)                Time: 1430-1445 OT Time Calculation (min): 15 min Charges:  OT General Charges $OT Visit: 1 Visit OT Evaluation $OT Eval Moderate Complexity: Melissa, OT/L   Acute OT Clinical Specialist [place] Pager [phone] Office [phone]   [facility] 01/28/2019, 3:01 PM

## 2019-01-28 NOTE — Evaluation (Signed)
Physical Therapy Evaluation Patient Details Name: Lisa Hahn MRN: [id number] DOB: 08-Jul-1940 Today's Date: 01/28/2019   History of Present Illness  78 year old female with history of hypertension, dementia, prior CVA and poorly verbal at baseline, hypertension, diabetes mellitus, admitted from SNF on 01/23/2021 elements with shortness of breath, dehydration, UTI and was found also to be Covid positive.  Chest x-ray on admission showed multifocal pneumonia.  She was also hyponatremic with a sodium of 160.  She was transferred to [facility] on 10/20  Clinical Impression   Pt admitted with above diagnosis. Pt is at max-total a with functional mobility this am, she has very difficult time with following cues/commands. Pt is on room air and sats do remain in 80-90s. Pt currently with functional limitations due to the deficits listed below (see PT Problem List). Pt will benefit from skilled PT to increase their independence and safety with mobility to allow discharge to the venue listed below.       Follow Up Recommendations SNF    Equipment Recommendations  None recommended by PT    Recommendations for Other Services       Precautions / Restrictions Precautions Precautions: Fall;Other (comment) Precaution Comments: cognition Restrictions Weight Bearing Restrictions: No      Mobility  Bed Mobility Overal bed mobility: Needs Assistance Bed Mobility: Rolling;Supine to Sit;Sit to Supine Rolling: Max assist   Supine to sit: Max assist Sit to supine: Max assist   General bed mobility comments: OOB in chair  Transfers Overall transfer level: Needs assistance Equipment used: 1 person hand held assist Transfers: Sit to/from [employer] Transfers   Stand pivot transfers: Max assist;Total assist Squat pivot transfers: Max assist        Ambulation/Gait             General Gait Details: Pt does not ambulate  Stairs            Wheelchair Mobility     Modified Rankin (Stroke Patients Only)       Balance Overall balance assessment: Needs assistance Sitting-balance support: Bilateral upper extremity supported;Feet unsupported Sitting balance-Leahy Scale: Poor Sitting balance - Comments: minimally able to support herself sitting EOB but very briefly then falls back and to left Postural control: Posterior lean;Right lateral lean   Standing balance-Leahy Scale: Zero Standing balance comment: stand with max to total a                             Pertinent Vitals/Pain Pain Assessment: Faces Faces Pain Scale: Hurts even more Pain Location: with any movement Pain Descriptors / Indicators: Grimacing;Moaning;Nagging Pain Intervention(s): Limited activity within patient's tolerance;Monitored during session    Home Living Family/patient expects to be discharged to:: Skilled nursing facility                      Prior Function Level of Independence: Needs assistance   Gait / Transfers Assistance Needed: does not ambulate at facility  ADL's / Homemaking Assistance Needed: needs assist with all ADLs  Comments: total A     Hand Dominance   Dominant Hand: Right    Extremity/Trunk Assessment   Upper Extremity Assessment Upper Extremity Assessment: Defer to OT evaluation    Lower Extremity Assessment Lower Extremity Assessment: Generalized weakness    Cervical / Trunk Assessment Cervical / Trunk Assessment: Other exceptions  Communication   Communication: Expressive difficulties  Cognition Arousal/Alertness: Lethargic Behavior During Therapy: Flat affect;Restless Overall Cognitive  Status: History of cognitive impairments - at baseline                                 General Comments: most likely close to baeline      General Comments      Exercises     Assessment/Plan    PT Assessment Patient needs continued PT services(while in hospital)  PT Problem List Decreased  strength;Decreased mobility;Decreased safety awareness;Decreased range of motion;Decreased coordination;Decreased knowledge of precautions;Decreased activity tolerance;Decreased cognition;Decreased balance       PT Treatment Interventions Therapeutic activities;Therapeutic exercise;Patient/family education;Balance training;Functional mobility training;Neuromuscular re-education    PT Goals (Current goals can be found in the Care Plan section)  Acute Rehab PT Goals Patient Stated Goal: pt unable  Time For Goal Achievement: 02/11/19 Potential to Achieve Goals: Fair    Frequency Min 2X/week   Barriers to discharge        Co-evaluation               AM-PAC PT "6 Clicks" Mobility  Outcome Measure Help needed turning from your back to your side while in a flat bed without using bedrails?: A Lot Help needed moving from lying on your back to sitting on the side of a flat bed without using bedrails?: A Lot Help needed moving to and from a bed to a chair (including a wheelchair)?: Total Help needed standing up from a chair using your arms (e.g., wheelchair or bedside chair)?: Total Help needed to walk in hospital room?: Total Help needed climbing 3-5 steps with a railing? : Total 6 Click Score: 8    End of Session Equipment Utilized During Treatment: Oxygen Activity Tolerance: Patient limited by lethargy;Treatment limited secondary to medical complications (Comment);Patient limited by pain;Patient limited by fatigue Patient left: in chair;with call bell/phone within reach;with chair alarm set Nurse Communication: Mobility status PT Visit Diagnosis: Other abnormalities of gait and mobility (R26.89);Muscle weakness (generalized) (M62.81)    Time: 0930-1001 PT Time Calculation (min) (ACUTE ONLY): 31 min   Charges:   PT Evaluation $PT Eval High Complexity: 1 High PT Treatments $Therapeutic Activity: 8-22 mins        Horald Chestnut, PT   Delford Field 01/28/2019, 5:05  PM

## 2019-01-28 NOTE — Consult Note (Signed)
   [facility] CM Inpatient Consult   01/28/2019  [name] 1940/06/14 [id number]  Patient screened for high risk score for unplanned readmission in medicare ACO for less than 7 days re-hospitalizations.    Review of patient's medical record reveals patient is from MD notes as follows which includes but not limited to:  Lisa Hahn is a 78 y.o. female with medical history significant of htn, dementia, dm with sob.  Nonverbal at baseline due to dementia.  Presented sob, hypoxic and dehydrated.  covid positive.  Pt referred for admission for bilateral covid pna.  Plan:  This patient apears to be a long term care residents at a facility per therapy notes. For questions contact:   Natividad Brood, RN BSN [facility] Liaison  [phone] business mobile phone Toll free office [phone]  Fax number: [phone] [email or web address]

## 2019-01-29 LAB — CBC WITH DIFFERENTIAL/PLATELET
Abs Immature Granulocytes: 0.11 10*3/uL — ABNORMAL HIGH (ref 0.00–0.07)
Basophils Absolute: 0 10*3/uL (ref 0.0–0.1)
Basophils Relative: 0 %
Eosinophils Absolute: 0 10*3/uL (ref 0.0–0.5)
Eosinophils Relative: 1 %
HCT: 33.9 % — ABNORMAL LOW (ref 36.0–46.0)
Hemoglobin: 10.8 g/dL — ABNORMAL LOW (ref 12.0–15.0)
Immature Granulocytes: 2 %
Lymphocytes Relative: 28 %
Lymphs Abs: 2.1 10*3/uL (ref 0.7–4.0)
MCH: 28.4 pg (ref 26.0–34.0)
MCHC: 31.9 g/dL (ref 30.0–36.0)
MCV: 89.2 fL (ref 80.0–100.0)
Monocytes Absolute: 0.5 10*3/uL (ref 0.1–1.0)
Monocytes Relative: 6 %
Neutro Abs: 4.7 10*3/uL (ref 1.7–7.7)
Neutrophils Relative %: 63 %
Platelets: 183 10*3/uL (ref 150–400)
RBC: 3.8 MIL/uL — ABNORMAL LOW (ref 3.87–5.11)
RDW: 16.4 % — ABNORMAL HIGH (ref 11.5–15.5)
WBC: 7.5 10*3/uL (ref 4.0–10.5)
nRBC: 0 % (ref 0.0–0.2)

## 2019-01-29 LAB — GLUCOSE, CAPILLARY
Glucose-Capillary: 219 mg/dL — ABNORMAL HIGH (ref 70–99)
Glucose-Capillary: 217 mg/dL — ABNORMAL HIGH (ref 70–99)
Glucose-Capillary: 253 mg/dL — ABNORMAL HIGH (ref 70–99)
Glucose-Capillary: 264 mg/dL — ABNORMAL HIGH (ref 70–99)

## 2019-01-29 LAB — COMPREHENSIVE METABOLIC PANEL
ALT: 72 U/L — ABNORMAL HIGH (ref 0–44)
AST: 72 U/L — ABNORMAL HIGH (ref 15–41)
Albumin: 2.1 g/dL — ABNORMAL LOW (ref 3.5–5.0)
Alkaline Phosphatase: 39 U/L (ref 38–126)
Anion gap: 9 (ref 5–15)
BUN: 19 mg/dL (ref 8–23)
CO2: 24 mmol/L (ref 22–32)
Calcium: 8.2 mg/dL — ABNORMAL LOW (ref 8.9–10.3)
Chloride: 114 mmol/L — ABNORMAL HIGH (ref 98–111)
Creatinine, Ser: 0.39 mg/dL — ABNORMAL LOW (ref 0.44–1.00)
GFR calc Af Amer: >60 mL/min (ref >60)
GFR calc non Af Amer: >60 mL/min (ref >60)
Glucose, Bld: 255 mg/dL — ABNORMAL HIGH (ref 70–99)
Potassium: 4 mmol/L (ref 3.5–5.1)
Sodium: 147 mmol/L — ABNORMAL HIGH (ref 135–145)
Total Bilirubin: 0.7 mg/dL (ref 0.3–1.2)
Total Protein: 5.3 g/dL — ABNORMAL LOW (ref 6.5–8.1)

## 2019-01-29 LAB — D-DIMER, QUANTITATIVE: D-Dimer, Quant: 3.06 ug/mL-FEU — ABNORMAL HIGH (ref 0.00–0.50)

## 2019-01-29 LAB — C-REACTIVE PROTEIN: CRP: 4.3 mg/dL — ABNORMAL HIGH (ref <1.0)

## 2019-01-29 MED ORDER — MIRTAZAPINE 15 MG PO TBDP
15.0000 mg | ORAL_TABLET | Freq: Every day | ORAL | Status: DC
  Administered 2019-01-31 – 2019-02-01 (×2): 15 mg via ORAL
  Filled 2019-01-29 (×5): qty 1

## 2019-01-29 MED ORDER — POTASSIUM CHLORIDE CRYS ER 20 MEQ PO TBCR
40.0000 meq | EXTENDED_RELEASE_TABLET | Freq: Every day | ORAL | Status: AC
  Administered 2019-01-29: 10:00:00 40 meq via ORAL

## 2019-01-29 MED ORDER — POTASSIUM CL IN DEXTROSE 5% 20 MEQ/L IV SOLN
20.0000 meq | INTRAVENOUS | Status: DC
  Administered 2019-01-29 – 2019-01-30 (×2): 20 meq via INTRAVENOUS
  Filled 2019-01-29 (×3): qty 1000

## 2019-01-29 NOTE — Progress Notes (Signed)
PROGRESS NOTE  Renica Crail [id number] DOB: [birth year] DOA: 01/25/2019 PCP: Marco Collie, MD   LOS: 4 days   Brief Narrative / Interim history:  78 year old female with history of hypertension, dementia, prior CVA and poorly verbal at baseline, hypertension, diabetes mellitus, admitted from SNF on 01/23/2021 elements with shortness of breath, dehydration, UTI and was found also to be Covid positive.  Chest x-ray on admission showed multifocal pneumonia.  She was also hyponatremic with a sodium of 160.  She was transferred to [facility] on 10/20  Subjective / 24h Interval events:  Patient is significantly demented, does not answer questions appropriately, does not follow commands, no significant events overnight per staff .  remains with poor oral intake per staff  Assessment & Plan: Principal Problem:   Pneumonia due to COVID-19 virus Active Problems:   Type 2 diabetes mellitus (HCC)   Dementia (HCC)   Abdominal pain, chronic, epigastric   UTI (urinary tract infection)   Acute hypoxic respiratory failure due to COVID-19 pneumonia -Remains on 2 L nasal cannula, unable to follow instructions for incentive spirometry, given her advanced dementia . -Continue with steroids -Treated with remdesivir X 5 days -Continue to follow inflammatory markers closely, CRP trending down, D-dimers trending down, but remains significantly elevated.   -DVT prophylaxis per COVID-19 protocol.  COVID-19 Labs  Recent Labs    01/28/19 0440 01/29/19 0515  DDIMER 2.91* 3.06*  CRP 4.6* 4.3*    Lab Results  Component Value Date   SARSCOV2NAA POSITIVE (A) 01/23/2019    Type 2 diabetes mellitus -CBG appear to be mildly elevated, but this is secondary to D5W, which I have been decreasing today, so we will continue with insulin sliding scale with no insulin adjustment. -Hold home oral agents, Metformin and Januvia.  CBG (last 3)  Recent Labs    01/28/19 1638 01/28/19 2113 01/29/19 0920   GLUCAP 276* 355* 219*   Nonsustained VT/SVT -Most recent 2D echo showed an EF of 60 to [id number], grade 1 diastolic dysfunction, this was back in 2018.  -Correct hypokalemia . - continue to monitor on telemetry -Started on low-dose metoprolol  Hypokalemia -Repleted  Hypophosphatemia -Repleted  UTI -Urinalysis at [facility] with evidence of infection, treated with Rocephin,  Hypernatremia -Improving with D5W, will decrease rate to 40 cc today  Prior CVA -Resume home aspirin  Transaminitis -due to COVID 19, watch closely especially on remdesivir  Dementia -Resume Aricept and Namenda. -Unclear if she is on Depakote, but it was on her medication at facility, it wase resumed -Intermittent with significant poor oral intake, refusing any oral intake closing her mouth when I try to feed her today, will start on Remeron hopefully this will improve her appetite.  Hypertension -On metoprolol as above, hold losartan  Scheduled Meds: . aspirin EC  81 mg Oral Daily  . atorvastatin  40 mg Oral Daily  . Chlorhexidine Gluconate Cloth  6 each Topical Daily  . dexamethasone (DECADRON) injection  6 mg Intravenous Q24H  . divalproex  250 mg Oral TID  . donepezil  5 mg Oral QHS  . enoxaparin (LOVENOX) injection  40 mg Subcutaneous Q24H  . feeding supplement (ENSURE ENLIVE)  237 mL Oral TID BM  . insulin aspart  0-9 Units Subcutaneous TID WC  . memantine  5 mg Oral BID  . metoprolol tartrate  12.5 mg Oral BID  . mirtazapine  15 mg Oral QHS  . multivitamin with minerals  1 tablet Oral Daily  . sodium chloride flush  10-40 mL Intracatheter Q12H  . sodium chloride flush  3 mL Intravenous Q12H  . vitamin C  500 mg Oral Daily  . zinc sulfate  220 mg Oral Daily   Continuous Infusions: . sodium chloride    . dextrose 5 % with KCl 20 mEq / L 20 mEq (01/29/19 0952)   PRN Meds:.sodium chloride, acetaminophen, chlorpheniramine-HYDROcodone, guaiFENesin-dextromethorphan, ondansetron **OR** ondansetron  (ZOFRAN) IV, sodium chloride flush, sodium chloride flush  DVT prophylaxis: Lovenox Code Status: DNR Family Communication: Discussed with son via phone yesterday Disposition Plan: Home with home health  Consultants:  None   Procedures:  None   Microbiology  None   Antimicrobials: Ceftriaxone     Objective: Vitals:   01/29/19 0620 01/29/19 0624 01/29/19 0900 01/29/19 1129  BP:   101/71 (!) 100/55  Pulse:  68  68  Resp: 12 14  17   Temp:   98.1 F (36.7 C) 99 F (37.2 C)  TempSrc:   Oral Axillary  SpO2:  94%  94%  Weight:        Intake/Output Summary (Last 24 hours) at 01/29/2019 1211 Last data filed at 01/29/2019 0300 Gross per 24 hour  Intake 1807.75 ml  Output -  Net 1807.75 ml   Filed Weights   01/25/19 0510  Weight: 59.4 kg    Examination:  Awake, extremely frail, demented, does not follow commands, does not answer questions appropriately Symmetrical Chest wall movement, Good air movement bilaterally, CTAB RRR,No Gallops,Rubs or new Murmurs, No Parasternal Heave +ve B.Sounds, Abd Soft, No tenderness, No rebound - guarding or rigidity. No Cyanosis, Clubbing or edema, No new Rash or bruise        Data Reviewed: I have independently reviewed following labs and imaging studies   CBC: Recent Labs  Lab 01/23/19 0915 01/24/19 0538 01/25/19 0715 01/26/19 0230 01/28/19 0440 01/29/19 0515  WBC 10.8* 12.3* 11.9* 7.0 8.6 7.5  NEUTROABS 8.0*  --   --  5.5 6.0 4.7  HGB 15.3* 13.0 12.0 11.0* 11.4* 10.8*  HCT 48.1* 42.9 39.2 35.7* 35.9* 33.9*  MCV 88.6 92.5 92.5 91.1 88.4 89.2  PLT 223 194 171 213 227 [id number]   Basic Metabolic Panel: Recent Labs  Lab 01/25/19 0715 01/26/19 0230 01/28/19 0440 01/28/19 1600 01/29/19 0515  NA 155* 148* 155* 150* 147*  K 3.7 3.1* 2.7* 4.2 4.0  CL 121* 115* 122* 118* 114*  CO2 24 23 26 26 24   GLUCOSE 134* 261* 177* 332* 255*  BUN 29* 35* 25* 24* 19  CREATININE 0.64 0.54 0.47 0.45 0.39*  CALCIUM 9.0 8.3* 8.6* 8.5*  8.2*  MG 1.8  --  1.8  --   --   PHOS  --   --  2.3*  --   --    GFR: Estimated Creatinine Clearance: 52.2 mL/min (A) (by C-G formula based on SCr of 0.39 mg/dL (L)). Liver Function Tests: Recent Labs  Lab 01/23/19 0915 01/25/19 0715 01/26/19 0230 01/28/19 0440 01/29/19 0515  AST 32 31 24 105* 72*  ALT 16 17 15  72* 72*  ALKPHOS 37* 38 36* 40 39  BILITOT 1.6* 1.4* 0.9 1.0 0.7  PROT 8.4* 6.8 5.8* 5.6* 5.3*  ALBUMIN 3.2* 2.6* 2.3* 2.2* 2.1*   No results for input(s): LIPASE, AMYLASE in the last 168 hours. No results for input(s): AMMONIA in the last 168 hours. Coagulation Profile: No results for input(s): INR, PROTIME in the last 168 hours. Cardiac Enzymes: No results for input(s): CKTOTAL, CKMB, CKMBINDEX, TROPONINI in the  last 168 hours. BNP (last 3 results) No results for input(s): PROBNP in the last 8760 hours. HbA1C: No results for input(s): HGBA1C in the last 72 hours. CBG: Recent Labs  Lab 01/28/19 0850 01/28/19 1207 01/28/19 1638 01/28/19 2113 01/29/19 0920  GLUCAP 179* 203* 276* 355* 219*   Lipid Profile: No results for input(s): CHOL, HDL, LDLCALC, TRIG, CHOLHDL, LDLDIRECT in the last 72 hours. Thyroid Function Tests: No results for input(s): TSH, T4TOTAL, FREET4, T3FREE, THYROIDAB in the last 72 hours. Anemia Panel: No results for input(s): VITAMINB12, FOLATE, FERRITIN, TIBC, IRON, RETICCTPCT in the last 72 hours. Urine analysis:    Component Value Date/Time   COLORURINE AMBER (A) 01/23/2019 1031   APPEARANCEUR CLOUDY (A) 01/23/2019 1031   APPEARANCEUR Clear 02/25/2014 1352   LABSPEC 1.021 01/23/2019 1031   LABSPEC 1.015 02/25/2014 1352   PHURINE 5.0 01/23/2019 1031   GLUCOSEU NEGATIVE 01/23/2019 1031   GLUCOSEU Negative 02/25/2014 1352   HGBUR SMALL (A) 01/23/2019 1031   BILIRUBINUR NEGATIVE 01/23/2019 1031   BILIRUBINUR negative 08/22/2016 0900   BILIRUBINUR Negative 02/25/2014 1352   KETONESUR NEGATIVE 01/23/2019 1031   PROTEINUR 100 (A)  01/23/2019 1031   UROBILINOGEN 0.2 08/22/2016 0900   UROBILINOGEN 1.0 07/15/2010 0827   NITRITE POSITIVE (A) 01/23/2019 1031   LEUKOCYTESUR MODERATE (A) 01/23/2019 1031   LEUKOCYTESUR 1+ 02/25/2014 1352   Sepsis Labs: Invalid input(s): PROCALCITONIN, LACTICIDVEN  Recent Results (from the past 240 hour(s))  SARS CORONAVIRUS 2 (TAT 6-24 HRS) Nasopharyngeal Nasopharyngeal Swab     Status: Abnormal   Collection Time: 01/23/19  9:15 AM   Specimen: Nasopharyngeal Swab  Result Value Ref Range Status   SARS Coronavirus 2 POSITIVE (A) NEGATIVE Final    Comment: RESULT CALLED TO, READ BACK BY AND VERIFIED WITH: DARLENE LISTOPAD RN.@1800  ON 10.18.2020 BY TCALDWELL MT. (NOTE) SARS-CoV-2 target nucleic acids are DETECTED. The SARS-CoV-2 RNA is generally detectable in upper and lower respiratory specimens during the acute phase of infection. Positive results are indicative of active infection with SARS-CoV-2. Clinical  correlation with patient history and other diagnostic information is necessary to determine patient infection status. Positive results do  not rule out bacterial infection or co-infection with other viruses. The expected result is Negative. Fact Sheet for Patients: SugarRoll.be Fact Sheet for Healthcare Providers: https://www.woods-mathews.com/ This test is not yet approved or cleared by the [place] FDA and  has been authorized for detection and/or diagnosis of SARS-CoV-2 by FDA under an Emergency Use Authorization (EUA). This EUA will remain  in effect (meaning this te st can be used) for the duration of the COVID-19 declaration under Section 564(b)(1) of the Act, 21 U.S.C. section 360bbb-3(b)(1), unless the authorization is terminated or revoked sooner. Performed at [facility], [place] [address], [place], [place] [address]   Culture, blood (routine x 2)     Status: None   Collection Time: 01/23/19 11:38 AM   Specimen:  BLOOD  Result Value Ref Range Status   Specimen Description BLOOD LEFT ANTECUBITAL  Final   Special Requests   Final    BOTTLES DRAWN AEROBIC AND ANAEROBIC Blood Culture adequate volume   Culture   Final    NO GROWTH 5 DAYS Performed at [facility], [address], [place], [place] [address]    Report Status 01/28/2019 FINAL  Final  Culture, blood (routine x 2)     Status: None   Collection Time: 01/23/19 11:38 AM   Specimen: BLOOD  Result Value Ref Range Status  Specimen Description BLOOD RIGHT ANTECUBITAL  Final   Special Requests   Final    BOTTLES DRAWN AEROBIC AND ANAEROBIC Blood Culture adequate volume   Culture   Final    NO GROWTH 5 DAYS Performed at [facility], [address] [place], [place] [address]    Report Status 01/28/2019 FINAL  Final  MRSA PCR Screening     Status: None   Collection Time: 01/23/19  3:54 PM   Specimen: Nasal Mucosa; Nasopharyngeal  Result Value Ref Range Status   MRSA by PCR NEGATIVE NEGATIVE Final    Comment:        The GeneXpert MRSA Assay (FDA approved for NASAL specimens only), is one component of a comprehensive MRSA colonization surveillance program. It is not intended to diagnose MRSA infection nor to guide or monitor treatment for MRSA infections. Performed at [facility], [address] [place], [place] [address]   Culture, Urine     Status: None   Collection Time: 01/25/19  6:30 AM   Specimen: Urine, Random  Result Value Ref Range Status   Specimen Description   Final    URINE, RANDOM Performed at [facility] [address] [place], [place] [address]    Special Requests   Final    NONE Performed at [facility], [place] [address] [place],  [address]    Culture   Final    NO GROWTH Performed at [facility], [place] [address] [place],  [address]    Report Status 01/26/2019 FINAL  Final      Radiology Studies: No results  found.  Phillips Climes MD [facility]  Contact via  [email or web address]  [place] P: [phone] F: [phone]

## 2019-01-29 NOTE — TOC Initial Note (Addendum)
Transition of Care [facility]) - Initial/Assessment Note    Patient Details  Name: Lisa Hahn MRN: [id number] Date of Birth: Dec 13, 1940  Transition of Care [facility]) CM/SW Contact:    Eileen Stanford, LCSW Phone Number: 01/29/2019, 11:15 AM  Clinical Narrative:  Pt is COVID-19 + located at [facility]. Pt is only alert to self. CSW spoke with pt's son via telephone. Pt can from [facility] (SNF) LTC. [place] has a COVID-19 outbreak. Pt's son does not want to send pt back there during the outbreak. Pt's son would prefer pt come home with max HH services, including a SW. Pt's son states once the facility has passed the outbreak he would consider sending pt back. Pt has no equipment at home. Pt does not have home 02. Pt will need both at d/c. Pt will need max HH services. Pt's son states it is only him at home and he has mask to protect himself. Pt's son is going to take off work for as long as he needs to. CSW to continue to follow for medical stability.                 High readmission risk screening completed with pt's son.  Expected Discharge Plan: [place] Barriers to Discharge: Continued Medical Work up   Patient Goals and CMS Choice Patient states their goals for this hospitalization and ongoing recovery are:: "to get my momma home" -per son CMS Medicare.gov Compare Post Acute Care list provided to:: Patient Represenative (must comment)(list read to pt's son) Choice offered to / list presented to : Adult Children  Expected Discharge Plan and Services Expected Discharge Plan: [place] In-house Referral: NA   Post Acute Care Choice: [place] arrangements for the past 2 months: [place]                                      Prior Living Arrangements/Services Living arrangements for the past 2 months: [place] Lives with:: Self Patient language and need for interpreter reviewed:: Yes Do  you feel safe going back to the place where you live?: No   lives at [facility] (SNF)- has covid outbreak  Need for Family Participation in Patient Care: Yes (Comment)(pt's son) Care giver support system in place?: Yes (comment)(pt's son)   Criminal Activity/Legal Involvement Pertinent to Current Situation/Hospitalization: No - Comment as needed  Activities of Daily Living Home Assistive Devices/Equipment: Built-in shower seat, Bedside commode/3-in-1, Grab bars around toilet, Grab bars in shower ADL Screening (condition at time of admission) Patient's cognitive ability adequate to safely complete daily activities?: No Is the patient deaf or have difficulty hearing?: No Does the patient have difficulty seeing, even when wearing glasses/contacts?: Yes Does the patient have difficulty concentrating, remembering, or making decisions?: Yes Patient able to express need for assistance with ADLs?: No Does the patient have difficulty dressing or bathing?: Yes Independently performs ADLs?: No Communication: Dependent Is this a change from baseline?: Pre-admission baseline Dressing (OT): Dependent Is this a change from baseline?: Pre-admission baseline Grooming: Dependent Is this a change from baseline?: Pre-admission baseline Feeding: Dependent Is this a change from baseline?: Pre-admission baseline Bathing: Dependent Is this a change from baseline?: Pre-admission baseline Toileting: Dependent Is this a change from baseline?: Pre-admission baseline In/Out Bed: Dependent Is this a change from baseline?: Pre-admission baseline Walks in Home:  Dependent Is this a change from baseline?: Pre-admission baseline Does the patient have difficulty walking or climbing stairs?: Yes Weakness of Legs: Both Weakness of Arms/Hands: Both  Permission Sought/Granted Permission sought to share information with : Family Supports, Customer service manager    Share Information with NAME:  Linton Rump  Permission granted to share info w AGENCY: [place] agencies  Permission granted to share info w Relationship: Son     Emotional Assessment Appearance:: Appears stated age Attitude/Demeanor/Rapport: Unable to Assess Affect (typically observed): Unable to Assess Orientation: : Oriented to Self Alcohol / Substance Use: Not Applicable Psych Involvement: No (comment)  Admission diagnosis:  COVID 19 Patient Active Problem List   Diagnosis Date Noted  . Pneumonia due to COVID-19 virus 01/25/2019  . Pneumonia 01/23/2019  . UTI (urinary tract infection) 02/03/2017  . Syncope 01/27/2017  . Decreased pedal pulses 08/22/2016  . Hyperlipidemia 08/22/2016  . Poor dentition 04/16/2016  . Abdominal pain 04/16/2016  . Onychomycosis 04/16/2016  . Lightheadedness 08/15/2015  . Abdominal pain, chronic, epigastric 08/15/2015  . Dementia ([place]) 07/25/2015  . Chest pain 06/28/2015  . Strain of left quadriceps tendon 06/28/2015  . Type 2 diabetes mellitus ([place]) 06/28/2015  . Vision changes 06/28/2015   PCP:  Marco Collie, MD Pharmacy:   [id number] [address], [place] [address] Phone: [phone] Fax: [phone]     Social Determinants of Health (SDOH) Interventions    Readmission Risk Interventions Readmission Risk Prevention Plan 01/29/2019  Transportation Screening Complete  PCP or Specialist Appt within 3-5 Days Complete  HRI or [place] Complete  Social Work Consult for [place] Planning/Counseling Complete  Palliative Care Screening Complete  Medication Review Press photographer) Complete  Some recent data might be hidden

## 2019-01-29 NOTE — NC FL2 (Signed)
[place] MEDICAID FL2 LEVEL OF CARE SCREENING TOOL     IDENTIFICATION  Patient Name: Lisa Hahn Birthdate: 06-12-1940 Sex: female Admission Date (Current Location): 01/25/2019  [facility] and [place] Number:  Herbalist and Address:  The . [facility], [place] [address] [place], [place] [address]      Provider Number: [id number]  Attending Physician Name and Address:  Elgergawy, Silver Huguenin, MD  Relative Name and Phone Number:       Current Level of Care: Hospital Recommended Level of Care: [place] Prior Approval Number:    Date Approved/Denied:   PASRR Number:    Discharge Plan: SNF    Current Diagnoses: Patient Active Problem List   Diagnosis Date Noted  . Pneumonia due to COVID-19 virus 01/25/2019  . Pneumonia 01/23/2019  . UTI (urinary tract infection) 02/03/2017  . Syncope 01/27/2017  . Decreased pedal pulses 08/22/2016  . Hyperlipidemia 08/22/2016  . Poor dentition 04/16/2016  . Abdominal pain 04/16/2016  . Onychomycosis 04/16/2016  . Lightheadedness 08/15/2015  . Abdominal pain, chronic, epigastric 08/15/2015  . Dementia ([place]) 07/25/2015  . Chest pain 06/28/2015  . Strain of left quadriceps tendon 06/28/2015  . Type 2 diabetes mellitus ([employer]) 06/28/2015  . Vision changes 06/28/2015    Orientation RESPIRATION BLADDER Height & Weight     Self  O2(nasal cannula 3L) Incontinent Weight: 130 lb 15.3 oz (59.4 kg) Height:     BEHAVIORAL SYMPTOMS/MOOD NEUROLOGICAL BOWEL NUTRITION STATUS      Incontinent Diet(heart healthy, thin liquids)  AMBULATORY STATUS COMMUNICATION OF NEEDS Skin   Extensive Assist Verbally PU Stage and Appropriate Care   PU Stage 2 Dressing: (located on buttocks)                   Personal Care Assistance Level of Assistance  Bathing, Feeding, Dressing Bathing Assistance: Maximum assistance Feeding assistance: Limited assistance Dressing Assistance: Maximum assistance     Functional  Limitations Info  Sight, Hearing, Speech Sight Info: Adequate Hearing Info: Adequate Speech Info: Adequate    SPECIAL CARE FACTORS FREQUENCY  PT (By licensed PT), OT (By licensed OT)     PT Frequency: 5x OT Frequency: 5x            Contractures Contractures Info: Not present    Additional Factors Info  Code Status, Allergies, Isolation Precautions Code Status Info: DNR Allergies Info: Aspirin, Motrin (Ibuprofen)     Isolation Precautions Info: COVID-19 +     Current Medications (01/29/2019):  This is the current hospital active medication list Current Facility-Administered Medications  Medication Dose Route Frequency Provider Last Rate Last Dose  . 0.9 %  sodium chloride infusion  250 mL Intravenous PRN Phillips Grout, MD      . acetaminophen (TYLENOL) tablet 650 mg  650 mg Oral Q6H PRN Phillips Grout, MD      . aspirin EC tablet 81 mg  81 mg Oral Daily Caren Griffins, MD   81 mg at 01/29/19 0953  . atorvastatin (LIPITOR) tablet 40 mg  40 mg Oral Daily Caren Griffins, MD   40 mg at 01/29/19 0956  . Chlorhexidine Gluconate Cloth 2 % PADS 6 each  6 each Topical Daily Elgergawy, Silver Huguenin, MD   6 each at 01/29/19 [phone]  . chlorpheniramine-HYDROcodone (TUSSIONEX) 10-8 MG/5ML suspension 5 mL  5 mL Oral Q12H PRN Derrill Kay A, MD      . dexamethasone (DECADRON) injection 6 mg  6 mg  Intravenous Q24H Phillips Grout, MD   6 mg at 01/29/19 0957  . dextrose 5 % with KCl 20 mEq / L  infusion  20 mEq Intravenous Continuous Elgergawy, Silver Huguenin, MD 40 mL/hr at 01/29/19 0952 20 mEq at 01/29/19 0952  . divalproex (DEPAKOTE SPRINKLE) capsule 250 mg  250 mg Oral TID Elgergawy, Silver Huguenin, MD   250 mg at 01/29/19 0954  . donepezil (ARICEPT) tablet 5 mg  5 mg Oral QHS Caren Griffins, MD   5 mg at 01/28/19 2146  . enoxaparin (LOVENOX) injection 40 mg  40 mg Subcutaneous Q24H Elgergawy, Silver Huguenin, MD   40 mg at 01/28/19 1439  . feeding supplement (ENSURE ENLIVE) (ENSURE ENLIVE) liquid 237  mL  237 mL Oral TID BM Caren Griffins, MD   237 mL at 01/29/19 1002  . guaiFENesin-dextromethorphan (ROBITUSSIN DM) 100-10 MG/5ML syrup 10 mL  10 mL Oral Q4H PRN Derrill Kay A, MD      . insulin aspart (novoLOG) injection 0-9 Units  0-9 Units Subcutaneous TID WC Phillips Grout, MD   3 Units at 01/29/19 0900  . memantine (NAMENDA) tablet 5 mg  5 mg Oral BID Caren Griffins, MD   5 mg at 01/29/19 0957  . metoprolol tartrate (LOPRESSOR) tablet 12.5 mg  12.5 mg Oral BID Caren Griffins, MD   12.5 mg at 01/29/19 0955  . multivitamin with minerals tablet 1 tablet  1 tablet Oral Daily Caren Griffins, MD   1 tablet at 01/29/19 0956  . ondansetron (ZOFRAN) tablet 4 mg  4 mg Oral Q6H PRN Phillips Grout, MD       Or  . ondansetron (ZOFRAN) injection 4 mg  4 mg Intravenous Q6H PRN Derrill Kay A, MD      . sodium chloride flush (NS) 0.9 % injection 10-40 mL  10-40 mL Intracatheter Q12H Elgergawy, Silver Huguenin, MD   10 mL at 01/29/19 0958  . sodium chloride flush (NS) 0.9 % injection 10-40 mL  10-40 mL Intracatheter PRN Elgergawy, Silver Huguenin, MD      . sodium chloride flush (NS) 0.9 % injection 3 mL  3 mL Intravenous Q12H Derrill Kay A, MD   3 mL at 01/29/19 0958  . sodium chloride flush (NS) 0.9 % injection 3 mL  3 mL Intravenous PRN Phillips Grout, MD   3 mL at 01/28/19 1031  . vitamin C (ASCORBIC ACID) tablet 500 mg  500 mg Oral Daily Derrill Kay A, MD   500 mg at 01/29/19 0957  . zinc sulfate capsule 220 mg  220 mg Oral Daily Phillips Grout, MD   220 mg at 01/29/19 [id number]     Discharge Medications: Please see discharge summary for a list of discharge medications.  Relevant Imaging Results:  Relevant Lab Results:   Additional Information SSN:[id number]  Gerrianne Scale Garland Smouse, LCSW

## 2019-01-29 NOTE — Plan of Care (Signed)
Updated patient and son Lisa Hahn on plan of care, all questions answered

## 2019-01-29 NOTE — Progress Notes (Signed)
Called and spoke with patient's son, Lisa Hahn.  Updated on his mother's condition.  All questions and concerns addressed.  Earleen Reaper RN

## 2019-01-30 LAB — CBC WITH DIFFERENTIAL/PLATELET
Abs Immature Granulocytes: 0.09 10*3/uL — ABNORMAL HIGH (ref 0.00–0.07)
Basophils Absolute: 0 10*3/uL (ref 0.0–0.1)
Basophils Relative: 0 %
Eosinophils Absolute: 0 10*3/uL (ref 0.0–0.5)
Eosinophils Relative: 0 %
HCT: 32.1 % — ABNORMAL LOW (ref 36.0–46.0)
Hemoglobin: 10.2 g/dL — ABNORMAL LOW (ref 12.0–15.0)
Immature Granulocytes: 1 %
Lymphocytes Relative: 29 %
Lymphs Abs: 2.1 10*3/uL (ref 0.7–4.0)
MCH: 27.9 pg (ref 26.0–34.0)
MCHC: 31.8 g/dL (ref 30.0–36.0)
MCV: 87.7 fL (ref 80.0–100.0)
Monocytes Absolute: 0.5 10*3/uL (ref 0.1–1.0)
Monocytes Relative: 7 %
Neutro Abs: 4.4 10*3/uL (ref 1.7–7.7)
Neutrophils Relative %: 63 %
Platelets: 178 10*3/uL (ref 150–400)
RBC: 3.66 MIL/uL — ABNORMAL LOW (ref 3.87–5.11)
RDW: 16.1 % — ABNORMAL HIGH (ref 11.5–15.5)
WBC: 7.1 10*3/uL (ref 4.0–10.5)
nRBC: 0 % (ref 0.0–0.2)

## 2019-01-30 LAB — COMPREHENSIVE METABOLIC PANEL
ALT: 60 U/L — ABNORMAL HIGH (ref 0–44)
AST: 48 U/L — ABNORMAL HIGH (ref 15–41)
Albumin: 2 g/dL — ABNORMAL LOW (ref 3.5–5.0)
Alkaline Phosphatase: 44 U/L (ref 38–126)
Anion gap: 7 (ref 5–15)
BUN: 18 mg/dL (ref 8–23)
CO2: 26 mmol/L (ref 22–32)
Calcium: 8.3 mg/dL — ABNORMAL LOW (ref 8.9–10.3)
Chloride: 109 mmol/L (ref 98–111)
Creatinine, Ser: 0.35 mg/dL — ABNORMAL LOW (ref 0.44–1.00)
GFR calc Af Amer: >60 mL/min (ref >60)
GFR calc non Af Amer: >60 mL/min (ref >60)
Glucose, Bld: 251 mg/dL — ABNORMAL HIGH (ref 70–99)
Potassium: 3.9 mmol/L (ref 3.5–5.1)
Sodium: 142 mmol/L (ref 135–145)
Total Bilirubin: 0.7 mg/dL (ref 0.3–1.2)
Total Protein: 5.2 g/dL — ABNORMAL LOW (ref 6.5–8.1)

## 2019-01-30 LAB — GLUCOSE, CAPILLARY
Glucose-Capillary: 246 mg/dL — ABNORMAL HIGH (ref 70–99)
Glucose-Capillary: 224 mg/dL — ABNORMAL HIGH (ref 70–99)
Glucose-Capillary: 198 mg/dL — ABNORMAL HIGH (ref 70–99)

## 2019-01-30 LAB — D-DIMER, QUANTITATIVE: D-Dimer, Quant: 2.93 ug/mL-FEU — ABNORMAL HIGH (ref 0.00–0.50)

## 2019-01-30 LAB — C-REACTIVE PROTEIN: CRP: 2.9 mg/dL — ABNORMAL HIGH (ref <1.0)

## 2019-01-30 MED ORDER — METFORMIN HCL 500 MG PO TABS
500.0000 mg | ORAL_TABLET | Freq: Two times a day (BID) | ORAL | Status: DC
  Administered 2019-01-31 (×2): 500 mg via ORAL
  Filled 2019-01-30 (×2): qty 1

## 2019-01-30 MED ORDER — SODIUM CHLORIDE 0.45 % IV BOLUS
500.0000 mL | Freq: Once | INTRAVENOUS | Status: AC
  Administered 2019-01-30: 500 mL via INTRAVENOUS

## 2019-01-30 NOTE — Progress Notes (Signed)
Called and updated patient's son, Linton Rump, on mother's progress during the day.  He was able to speak to her via phone.  All questions and concerns addressed.  Earleen Reaper RN

## 2019-01-30 NOTE — Progress Notes (Signed)
PROGRESS NOTE  Lisa Hahn [id number] DOB: 07-14-1940 DOA: 01/25/2019 PCP: Marco Collie, MD   LOS: 5 days   Brief Narrative / Interim history:  78 year old female with history of hypertension, dementia, prior CVA and poorly verbal at baseline, hypertension, diabetes mellitus, admitted from SNF on 01/23/2021 elements with shortness of breath, dehydration, UTI and was found also to be Covid positive.  Chest x-ray on admission showed multifocal pneumonia.  She was also hyponatremic with a sodium of 160.  She was transferred to [facility] on 10/20  Subjective / 24h Interval events:  Patient is significantly demented, does not answer questions appropriately, does not follow commands, no significant events overnight per staff .  remains with poor oral intake per staff  Assessment & Plan: Principal Problem:   Pneumonia due to COVID-19 virus Active Problems:   Type 2 diabetes mellitus (HCC)   Dementia (HCC)   Abdominal pain, chronic, epigastric   UTI (urinary tract infection)   Acute hypoxic respiratory failure due to COVID-19 pneumonia -Remains on 2 L nasal cannula this morning, unable to follow instructions for incentive spirometry, given her advanced dementia .  I was able to wean her to room air today -Continue with steroids -Treated with remdesivir X 5 days -Continue to follow inflammatory markers closely, CRP trending down, D-dimers trending down, but remains significantly elevated.   -DVT prophylaxis per COVID-19 protocol.  COVID-19 Labs  Recent Labs    01/28/19 0440 01/29/19 0515 01/30/19 0500  DDIMER 2.91* 3.06* 2.93*  CRP 4.6* 4.3* 2.9*    Lab Results  Component Value Date   SARSCOV2NAA POSITIVE (A) 01/23/2019    Type 2 diabetes mellitus -CBG appear to be mildly elevated, but this is secondary to D5W, I will start on low-dose Metformin and keep watching closely, continue to hold Januvia. - home oral agents, Metformin and Januvia.  CBG (last 3)  Recent Labs     01/29/19 1618 01/29/19 2028 01/30/19 0748  GLUCAP 253* 264* 246*   Nonsustained VT/SVT -Most recent 2D echo showed an EF of 60 to 123456, grade 1 diastolic dysfunction, this was back in 2018.  -Correct hypokalemia . - continue to monitor on telemetry -Started on low-dose metoprolol  Hypokalemia -Repleted  Hypophosphatemia -Repleted  UTI -Urinalysis at [facility] with evidence of infection, treated with Rocephin,  Hypernatremia -Improving with D5W, will decrease rate to 40 cc today  Prior CVA -Resume home aspirin  Transaminitis -due to COVID 19, watch closely especially on remdesivir  Dementia/failure to thrive -Resume Aricept and Namenda. -Unclear if she is on Depakote, but it was on her medication at facility, it wase resumed -Very poor oral intake, refusing oral oral intake, I have discussed with son today, would like her to go home with Nexium help can be provided, hoping if she is back home this will improve her appetite, as well she was started on Remeron, hopefully this will improve her appetite .  Hypertension -On metoprolol as above, hold losartan  Scheduled Meds: . aspirin EC  81 mg Oral Daily  . atorvastatin  40 mg Oral Daily  . Chlorhexidine Gluconate Cloth  6 each Topical Daily  . dexamethasone (DECADRON) injection  6 mg Intravenous Q24H  . divalproex  250 mg Oral TID  . donepezil  5 mg Oral QHS  . enoxaparin (LOVENOX) injection  40 mg Subcutaneous Q24H  . feeding supplement (ENSURE ENLIVE)  237 mL Oral TID BM  . insulin aspart  0-9 Units Subcutaneous TID WC  . memantine  5  mg Oral BID  . metoprolol tartrate  12.5 mg Oral BID  . mirtazapine  15 mg Oral QHS  . multivitamin with minerals  1 tablet Oral Daily  . sodium chloride flush  10-40 mL Intracatheter Q12H  . sodium chloride flush  3 mL Intravenous Q12H  . vitamin C  500 mg Oral Daily  . zinc sulfate  220 mg Oral Daily   Continuous Infusions: . sodium chloride    . dextrose 5 % with KCl 20 mEq  / L 20 mEq (01/30/19 1104)   PRN Meds:.sodium chloride, acetaminophen, chlorpheniramine-HYDROcodone, guaiFENesin-dextromethorphan, ondansetron **OR** ondansetron (ZOFRAN) IV, sodium chloride flush, sodium chloride flush  DVT prophylaxis: Lovenox Code Status: DNR Family Communication: Discussed with son via phone today Disposition Plan: Home with home health in 1 to 2 days.  Consultants:  None   Procedures:  None   Microbiology  None   Antimicrobials: Ceftriaxone     Objective: Vitals:   01/30/19 0537 01/30/19 0600 01/30/19 0700 01/30/19 0751  BP: 117/64   (!) 143/97  Pulse:      Resp: 14 13  14   Temp: 97.9 F (36.6 C)   (!) 97 F (36.1 C)  TempSrc: Axillary   Axillary  SpO2: 96%   94%  Weight:      Height:   5\' 5"  (1.651 m)     Intake/Output Summary (Last 24 hours) at 01/30/2019 1227 Last data filed at 01/30/2019 0600 Gross per 24 hour  Intake 801.62 ml  Output 0 ml  Net 801.62 ml   Filed Weights   01/25/19 0510  Weight: 59.4 kg    Examination:  Awake man significantly demented, but pleasant, unable to answer questions appropriately  symmetrical Chest wall movement, Good air movement bilaterally, CTAB RRR,No Gallops,Rubs or new Murmurs, No Parasternal Heave +ve B.Sounds, Abd Soft, No tenderness, No rebound - guarding or rigidity. No Cyanosis, Clubbing or edema, No new Rash or bruise        Data Reviewed: I have independently reviewed following labs and imaging studies   CBC: Recent Labs  Lab 01/25/19 0715 01/26/19 0230 01/28/19 0440 01/29/19 0515 01/30/19 0500  WBC 11.9* 7.0 8.6 7.5 7.1  NEUTROABS  --  5.5 6.0 4.7 4.4  HGB 12.0 11.0* 11.4* 10.8* 10.2*  HCT 39.2 35.7* 35.9* 33.9* 32.1*  MCV 92.5 91.1 88.4 89.2 87.7  PLT 171 213 227 183 0000000   Basic Metabolic Panel: Recent Labs  Lab 01/25/19 0715 01/26/19 0230 01/28/19 0440 01/28/19 1600 01/29/19 0515 01/30/19 0500  NA 155* 148* 155* 150* 147* 142  K 3.7 3.1* 2.7* 4.2 4.0 3.9  CL  121* 115* 122* 118* 114* 109  CO2 24 23 26 26 24 26   GLUCOSE 134* 261* 177* 332* 255* 251*  BUN 29* 35* 25* 24* 19 18  CREATININE 0.64 0.54 0.47 0.45 0.39* 0.35*  CALCIUM 9.0 8.3* 8.6* 8.5* 8.2* 8.3*  MG 1.8  --  1.8  --   --   --   PHOS  --   --  2.3*  --   --   --    GFR: Estimated Creatinine Clearance: 52.2 mL/min (A) (by C-G formula based on SCr of 0.35 mg/dL (L)). Liver Function Tests: Recent Labs  Lab 01/25/19 0715 01/26/19 0230 01/28/19 0440 01/29/19 0515 01/30/19 0500  AST 31 24 105* 72* 48*  ALT 17 15 72* 72* 60*  ALKPHOS 38 36* 40 39 44  BILITOT 1.4* 0.9 1.0 0.7 0.7  PROT 6.8 5.8* 5.6*  5.3* 5.2*  ALBUMIN 2.6* 2.3* 2.2* 2.1* 2.0*   No results for input(s): LIPASE, AMYLASE in the last 168 hours. No results for input(s): AMMONIA in the last 168 hours. Coagulation Profile: No results for input(s): INR, PROTIME in the last 168 hours. Cardiac Enzymes: No results for input(s): CKTOTAL, CKMB, CKMBINDEX, TROPONINI in the last 168 hours. BNP (last 3 results) No results for input(s): PROBNP in the last 8760 hours. HbA1C: No results for input(s): HGBA1C in the last 72 hours. CBG: Recent Labs  Lab 01/29/19 0920 01/29/19 1256 01/29/19 1618 01/29/19 2028 01/30/19 0748  GLUCAP 219* 217* 253* 264* 246*   Lipid Profile: No results for input(s): CHOL, HDL, LDLCALC, TRIG, CHOLHDL, LDLDIRECT in the last 72 hours. Thyroid Function Tests: No results for input(s): TSH, T4TOTAL, FREET4, T3FREE, THYROIDAB in the last 72 hours. Anemia Panel: No results for input(s): VITAMINB12, FOLATE, FERRITIN, TIBC, IRON, RETICCTPCT in the last 72 hours. Urine analysis:    Component Value Date/Time   COLORURINE AMBER (A) 01/23/2019 1031   APPEARANCEUR CLOUDY (A) 01/23/2019 1031   APPEARANCEUR Clear 02/25/2014 1352   LABSPEC 1.021 01/23/2019 1031   LABSPEC 1.015 02/25/2014 1352   PHURINE 5.0 01/23/2019 1031   GLUCOSEU NEGATIVE 01/23/2019 1031   GLUCOSEU Negative 02/25/2014 1352   HGBUR  SMALL (A) 01/23/2019 1031   BILIRUBINUR NEGATIVE 01/23/2019 1031   BILIRUBINUR negative 08/22/2016 0900   BILIRUBINUR Negative 02/25/2014 1352   KETONESUR NEGATIVE 01/23/2019 1031   PROTEINUR 100 (A) 01/23/2019 1031   UROBILINOGEN 0.2 08/22/2016 0900   UROBILINOGEN 1.0 07/15/2010 0827   NITRITE POSITIVE (A) 01/23/2019 1031   LEUKOCYTESUR MODERATE (A) 01/23/2019 1031   LEUKOCYTESUR 1+ 02/25/2014 1352   Sepsis Labs: Invalid input(s): PROCALCITONIN, LACTICIDVEN  Recent Results (from the past 240 hour(s))  SARS CORONAVIRUS 2 (TAT 6-24 HRS) Nasopharyngeal Nasopharyngeal Swab     Status: Abnormal   Collection Time: 01/23/19  9:15 AM   Specimen: Nasopharyngeal Swab  Result Value Ref Range Status   SARS Coronavirus 2 POSITIVE (A) NEGATIVE Final    Comment: RESULT CALLED TO, READ BACK BY AND VERIFIED WITH: DARLENE LISTOPAD RN.@1800  ON 10.18.2020 BY TCALDWELL MT. (NOTE) SARS-CoV-2 target nucleic acids are DETECTED. The SARS-CoV-2 RNA is generally detectable in upper and lower respiratory specimens during the acute phase of infection. Positive results are indicative of active infection with SARS-CoV-2. Clinical  correlation with patient history and other diagnostic information is necessary to determine patient infection status. Positive results do  not rule out bacterial infection or co-infection with other viruses. The expected result is Negative. Fact Sheet for Patients: SugarRoll.be Fact Sheet for Healthcare Providers: https://www.woods-mathews.com/ This test is not yet approved or cleared by the [place] FDA and  has been authorized for detection and/or diagnosis of SARS-CoV-2 by FDA under an Emergency Use Authorization (EUA). This EUA will remain  in effect (meaning this te st can be used) for the duration of the COVID-19 declaration under Section 564(b)(1) of the Act, 21 U.S.C. section 360bbb-3(b)(1), unless the authorization is  terminated or revoked sooner. Performed at [facility], [place] [address] [place], [place] [address]   Culture, blood (routine x 2)     Status: None   Collection Time: 01/23/19 11:38 AM   Specimen: BLOOD  Result Value Ref Range Status   Specimen Description BLOOD LEFT ANTECUBITAL  Final   Special Requests   Final    BOTTLES DRAWN AEROBIC AND ANAEROBIC Blood Culture adequate volume   Culture   Final  NO GROWTH 5 DAYS Performed at [facility], [address] [place], [place] [address]    Report Status 01/28/2019 FINAL  Final  Culture, blood (routine x 2)     Status: None   Collection Time: 01/23/19 11:38 AM   Specimen: BLOOD  Result Value Ref Range Status   Specimen Description BLOOD RIGHT ANTECUBITAL  Final   Special Requests   Final    BOTTLES DRAWN AEROBIC AND ANAEROBIC Blood Culture adequate volume   Culture   Final    NO GROWTH 5 DAYS Performed at [facility], [address] [place], [place] [address]    Report Status 01/28/2019 FINAL  Final  MRSA PCR Screening     Status: None   Collection Time: 01/23/19  3:54 PM   Specimen: Nasal Mucosa; Nasopharyngeal  Result Value Ref Range Status   MRSA by PCR NEGATIVE NEGATIVE Final    Comment:        The GeneXpert MRSA Assay (FDA approved for NASAL specimens only), is one component of a comprehensive MRSA colonization surveillance program. It is not intended to diagnose MRSA infection nor to guide or monitor treatment for MRSA infections. Performed at [facility], [address] [place], [place] [address]   Culture, Urine     Status: None   Collection Time: 01/25/19  6:30 AM   Specimen: Urine, Random  Result Value Ref Range Status   Specimen Description   Final    URINE, RANDOM Performed at [facility] [address] [place], [place] [address]    Special Requests   Final    NONE Performed at [facility], [place] [address]  [place], [place] [address]    Culture   Final    NO GROWTH Performed at [facility], [place] [address] [place],  [address]    Report Status 01/26/2019 FINAL  Final      Radiology Studies: No results found.  Phillips Climes MD [facility]  Contact via  [email or web address]  [place] P: [phone] F: [phone]

## 2019-01-31 LAB — GLUCOSE, CAPILLARY
Glucose-Capillary: 310 mg/dL — ABNORMAL HIGH (ref 70–99)
Glucose-Capillary: 195 mg/dL — ABNORMAL HIGH (ref 70–99)
Glucose-Capillary: 209 mg/dL — ABNORMAL HIGH (ref 70–99)
Glucose-Capillary: 229 mg/dL — ABNORMAL HIGH (ref 70–99)
Glucose-Capillary: 270 mg/dL — ABNORMAL HIGH (ref 70–99)
Glucose-Capillary: 248 mg/dL — ABNORMAL HIGH (ref 70–99)

## 2019-01-31 NOTE — Progress Notes (Addendum)
Physical Therapy Treatment Patient Details Name: Lisa Hahn MRN: [id number] DOB: 10/24/1940 Today's Date: 01/31/2019    History of Present Illness 78 year old female with history of hypertension, dementia, prior CVA and poorly verbal at baseline, hypertension, diabetes mellitus, admitted from SNF on 01/23/2021 elements with shortness of breath, dehydration, UTI and was found also to be Covid positive.  Chest x-ray on admission showed multifocal pneumonia.  She was also hyponatremic with a sodium of 160.  She was transferred to [facility] on 10/20    PT Comments    Per RN report, family is arranging to take pt home.  Pt is total care and max to total assist with all mobility to EOB.  Unable to assist when attempting to stand today and only chewed food (never actually swallowed it) when I attempted to help her eat, fully alert, in sitting EOB.  O2 sats in the 90s during all mobility on RA, but re-applied 2 L O2 [place] at end of session (she was wearing it at the beginning while sleeping).  If family takes her home, I feel a hospice referral for support of the pt and family is appropriate.  PT will continue to follow acutely for safe mobility progression.   Follow Up Recommendations  SNF(per RN, family decided to take pt home)      Equipment Recommendations  Hospital bed;Other (comment)(hoyer lift and PTAR transport home)    Recommendations for Other Services Other (comment)(hospice referral)     Precautions / Restrictions Precautions Precautions: Fall;Other (comment) Precaution Comments: cognition    Mobility  Bed Mobility Overal bed mobility: Needs Assistance Bed Mobility: Rolling;Supine to Sit;Sit to Supine Rolling: Max assist   Supine to sit: Max assist Sit to supine: Total assist   General bed mobility comments: Max assist to transition pt to sitting with not much initiation of legs to EOB, but some initiation of trunk up after PT assisted with movement up.  Total assist of  trunk and legs to return to supine.  Transfers Overall transfer level: Needs assistance Equipment used: None Transfers: Sit to/from Stand Sit to Stand: Total assist         General transfer comment: total assist to attempt standing at EOB, despite spontaneously moving bil LEs, pt did not take weight on her legs or feet or help power up to stand when pt initiated movement forward with verbal and manual cues to "stand up".  Bottom lifted up off of bed a few inches, but only because therapist pulling pt up with bedpad.   Ambulation/Gait             General Gait Details: unable          Balance Overall balance assessment: Needs assistance Sitting-balance support: Feet supported;No upper extremity supported Sitting balance-Leahy Scale: Poor Sitting balance - Comments: total, max assist EOB.  At times, pt coming forward to support herself, but then will stop without warning. Sat EOB for 20 mins attempting to eat.  Pt chewing food, but ulitmately, not swallowing.  Food had to be extracted from her mouth.  RN aware.                                     Cognition Arousal/Alertness: Awake/alert Behavior During Therapy: Flat affect Overall Cognitive Status: History of cognitive impairments - at baseline  General Comments: likely close to baseline as described per chart      Exercises General Exercises - Upper Extremity Shoulder Flexion: PROM;Both;10 reps Elbow Flexion: PROM;Both;10 reps General Exercises - Lower Extremity Ankle Circles/Pumps: PROM;Both;10 reps Heel Slides: PROM;Both;10 reps Hip ABduction/ADduction: PROM;Both;10 reps    General Comments General comments (skin integrity, edema, etc.): O2 removed from nose throught session and stayed in the 90s throughout. Re-applied 2 L O2 [place] at end of session.       Pertinent Vitals/Pain Pain Assessment: Faces Pain Score: 0-No pain           PT Goals  (current goals can now be found in the care plan section) Acute Rehab PT Goals Patient Stated Goal: unable Progress towards PT goals: Not progressing toward goals - comment(limited slow progress expected)    Frequency    Min 2X/week      PT Plan Current plan remains appropriate       AM-PAC PT "6 Clicks" Mobility   Outcome Measure  Help needed turning from your back to your side while in a flat bed without using bedrails?: Total Help needed moving from lying on your back to sitting on the side of a flat bed without using bedrails?: Total Help needed moving to and from a bed to a chair (including a wheelchair)?: Total Help needed standing up from a chair using your arms (e.g., wheelchair or bedside chair)?: Total Help needed to walk in hospital room?: Total Help needed climbing 3-5 steps with a railing? : Total 6 Click Score: 6    End of Session   Activity Tolerance: Other (comment)(limited by baseline cognition) Patient left: in bed;with call bell/phone within reach;with bed alarm set Nurse Communication: Mobility status PT Visit Diagnosis: Other abnormalities of gait and mobility (R26.89);Muscle weakness (generalized) (M62.81)     Time: [id number] PT Time Calculation (min) (ACUTE ONLY): 44 min  Charges:  $Therapeutic Exercise: 8-22 mins $Therapeutic Activity: 23-37 mins                    Najat Olazabal B. Khaniya Tenaglia, PT, DPT  Acute Rehabilitation [phone] pager [phone] office  @ [place]: [phone]   01/31/2019, 11:39 AM

## 2019-01-31 NOTE — Progress Notes (Signed)
PROGRESS NOTE  Lisa Hahn [id number] DOB: 02/03/1941 DOA: 01/25/2019 PCP: Marco Collie, MD   LOS: 6 days   Brief Narrative / Interim history:  78 year old female with history of hypertension, dementia, prior CVA and poorly verbal at baseline, hypertension, diabetes mellitus, admitted from SNF on 01/23/2021 elements with shortness of breath, dehydration, UTI and was found also to be Covid positive.  Chest x-ray on admission showed multifocal pneumonia.  She was also hyponatremic with a sodium of 160.  She was transferred to [facility] on 10/20  Subjective / 24h Interval events:  Patient denies any complaints today, no significant events overnight as discussed with staff  Assessment & Plan: Principal Problem:   Pneumonia due to COVID-19 virus Active Problems:   Type 2 diabetes mellitus (HCC)   Dementia (HCC)   Abdominal pain, chronic, epigastric   UTI (urinary tract infection)   Acute hypoxic respiratory failure due to COVID-19 pneumonia -Remains on 2 L nasal cannula this morning, unable to follow instructions for incentive spirometry, given her advanced dementia .  I was able to wean her to room air today -Continue with steroids -Treated with remdesivir X 5 days -Continue to follow inflammatory markers closely, CRP trending down, D-dimers trending down, but remains significantly elevated.   -DVT prophylaxis per COVID-19 protocol.  COVID-19 Labs  Recent Labs    01/29/19 0515 01/30/19 0500  DDIMER 3.06* 2.93*  CRP 4.3* 2.9*    Lab Results  Component Value Date   SARSCOV2NAA POSITIVE (A) 01/23/2019    Type 2 diabetes mellitus -CBG appear to be mildly elevated, started on low-dose Metformin and keep watching closely, continue to hold Januvia. - home oral agents, Metformin and Januvia.  CBG (last 3)  Recent Labs    01/31/19 0759 01/31/19 1124 01/31/19 1556  GLUCAP 209* 229* 270*   Nonsustained VT/SVT -Most recent 2D echo showed an EF of 60 to 65%, grade 1  diastolic dysfunction, this was back in 2018.  -Correct hypokalemia . - continue to monitor on telemetry -Started on low-dose metoprolol  Hypokalemia -Repleted  Hypophosphatemia -Repleted  UTI -Urinalysis at [facility] with evidence of infection, treated with Rocephin,  Hypernatremia -Improving with D5W, will decrease rate to 40 cc today  Prior CVA -Resume home aspirin  Transaminitis -due to COVID 19, watch closely especially on remdesivir  Dementia/failure to thrive -Resume Aricept and Namenda. -Unclear if she is on Depakote, but it was on her medication at facility, it wase resumed -Very poor oral intake, refusing oral oral intake, overall extremely weak, bedbound, have discussed with son today, he was hoping to take care at home, but apparently he has not seen her for a long time, and aware of her significant weakness, failure to thrive and severe debility, discussed hospice options.  Hypertension -blood pressure is soft , she required fluid bolus yesterday, stopped blood pressure meds yesterday, currently blood pressure acceptable.  Scheduled Meds: . aspirin EC  81 mg Oral Daily  . atorvastatin  40 mg Oral Daily  . Chlorhexidine Gluconate Cloth  6 each Topical Daily  . dexamethasone (DECADRON) injection  6 mg Intravenous Q24H  . divalproex  250 mg Oral TID  . enoxaparin (LOVENOX) injection  40 mg Subcutaneous Q24H  . feeding supplement (ENSURE ENLIVE)  237 mL Oral TID BM  . insulin aspart  0-9 Units Subcutaneous TID WC  . memantine  5 mg Oral BID  . metFORMIN  500 mg Oral BID WC  . mirtazapine  15 mg Oral QHS  .  multivitamin with minerals  1 tablet Oral Daily  . sodium chloride flush  10-40 mL Intracatheter Q12H  . sodium chloride flush  3 mL Intravenous Q12H  . vitamin C  500 mg Oral Daily  . zinc sulfate  220 mg Oral Daily   Continuous Infusions: . sodium chloride    . dextrose 5 % with KCl 20 mEq / L 20 mEq (01/30/19 1104)   PRN Meds:.sodium chloride,  acetaminophen, chlorpheniramine-HYDROcodone, guaiFENesin-dextromethorphan, ondansetron **OR** ondansetron (ZOFRAN) IV, sodium chloride flush, sodium chloride flush  DVT prophylaxis: Lovenox Code Status: DNR Family Communication: Discussed with son via phone today Disposition Plan: palliative medicine consulted, likely will go to hospice.  Consultants:  None   Procedures:  None   Microbiology  None   Antimicrobials: Ceftriaxone     Objective: Vitals:   01/31/19 0500 01/31/19 0600 01/31/19 0757 01/31/19 1045  BP:   130/65 (!) 94/52  Pulse:    70  Resp: 20 13 12 17   Temp:   (!) 97.5 F (36.4 C)   TempSrc:   Axillary   SpO2: 99% 95% 90% 97%  Weight:      Height:        Intake/Output Summary (Last 24 hours) at 01/31/2019 1644 Last data filed at 01/31/2019 0500 Gross per 24 hour  Intake 825.39 ml  Output 395 ml  Net 430.39 ml   Filed Weights   01/25/19 0510  Weight: 59.4 kg    Examination:  Awake , frail, demented Symmetrical Chest wall movement, Good air movement bilaterally, CTAB RRR,No Gallops,Rubs or new Murmurs, No Parasternal Heave +ve B.Sounds, Abd Soft, No tenderness, No rebound - guarding or rigidity. No Cyanosis, Clubbing or edema, No new Rash or bruise         Data Reviewed: I have independently reviewed following labs and imaging studies   CBC: Recent Labs  Lab 01/25/19 0715 01/26/19 0230 01/28/19 0440 01/29/19 0515 01/30/19 0500  WBC 11.9* 7.0 8.6 7.5 7.1  NEUTROABS  --  5.5 6.0 4.7 4.4  HGB 12.0 11.0* 11.4* 10.8* 10.2*  HCT 39.2 35.7* 35.9* 33.9* 32.1*  MCV 92.5 91.1 88.4 89.2 87.7  PLT 171 213 227 183 0000000   Basic Metabolic Panel: Recent Labs  Lab 01/25/19 0715 01/26/19 0230 01/28/19 0440 01/28/19 1600 01/29/19 0515 01/30/19 0500  NA 155* 148* 155* 150* 147* 142  K 3.7 3.1* 2.7* 4.2 4.0 3.9  CL 121* 115* 122* 118* 114* 109  CO2 24 23 26 26 24 26   GLUCOSE 134* 261* 177* 332* 255* 251*  BUN 29* 35* 25* 24* 19 18   CREATININE 0.64 0.54 0.47 0.45 0.39* 0.35*  CALCIUM 9.0 8.3* 8.6* 8.5* 8.2* 8.3*  MG 1.8  --  1.8  --   --   --   PHOS  --   --  2.3*  --   --   --    GFR: Estimated Creatinine Clearance: 52.2 mL/min (A) (by C-G formula based on SCr of 0.35 mg/dL (L)). Liver Function Tests: Recent Labs  Lab 01/25/19 0715 01/26/19 0230 01/28/19 0440 01/29/19 0515 01/30/19 0500  AST 31 24 105* 72* 48*  ALT 17 15 72* 72* 60*  ALKPHOS 38 36* 40 39 44  BILITOT 1.4* 0.9 1.0 0.7 0.7  PROT 6.8 5.8* 5.6* 5.3* 5.2*  ALBUMIN 2.6* 2.3* 2.2* 2.1* 2.0*   No results for input(s): LIPASE, AMYLASE in the last 168 hours. No results for input(s): AMMONIA in the last 168 hours. Coagulation Profile: No results  for input(s): INR, PROTIME in the last 168 hours. Cardiac Enzymes: No results for input(s): CKTOTAL, CKMB, CKMBINDEX, TROPONINI in the last 168 hours. BNP (last 3 results) No results for input(s): PROBNP in the last 8760 hours. HbA1C: No results for input(s): HGBA1C in the last 72 hours. CBG: Recent Labs  Lab 01/30/19 1631 01/30/19 2129 01/31/19 0759 01/31/19 1124 01/31/19 1556  GLUCAP 224* 198* 209* 229* 270*   Lipid Profile: No results for input(s): CHOL, HDL, LDLCALC, TRIG, CHOLHDL, LDLDIRECT in the last 72 hours. Thyroid Function Tests: No results for input(s): TSH, T4TOTAL, FREET4, T3FREE, THYROIDAB in the last 72 hours. Anemia Panel: No results for input(s): VITAMINB12, FOLATE, FERRITIN, TIBC, IRON, RETICCTPCT in the last 72 hours. Urine analysis:    Component Value Date/Time   COLORURINE AMBER (A) 01/23/2019 1031   APPEARANCEUR CLOUDY (A) 01/23/2019 1031   APPEARANCEUR Clear 02/25/2014 1352   LABSPEC 1.021 01/23/2019 1031   LABSPEC 1.015 02/25/2014 1352   PHURINE 5.0 01/23/2019 1031   GLUCOSEU NEGATIVE 01/23/2019 1031   GLUCOSEU Negative 02/25/2014 1352   HGBUR SMALL (A) 01/23/2019 1031   BILIRUBINUR NEGATIVE 01/23/2019 1031   BILIRUBINUR negative 08/22/2016 0900   BILIRUBINUR  Negative 02/25/2014 1352   KETONESUR NEGATIVE 01/23/2019 1031   PROTEINUR 100 (A) 01/23/2019 1031   UROBILINOGEN 0.2 08/22/2016 0900   UROBILINOGEN 1.0 07/15/2010 0827   NITRITE POSITIVE (A) 01/23/2019 1031   LEUKOCYTESUR MODERATE (A) 01/23/2019 1031   LEUKOCYTESUR 1+ 02/25/2014 1352   Sepsis Labs: Invalid input(s): PROCALCITONIN, LACTICIDVEN  Recent Results (from the past 240 hour(s))  SARS CORONAVIRUS 2 (TAT 6-24 HRS) Nasopharyngeal Nasopharyngeal Swab     Status: Abnormal   Collection Time: 01/23/19  9:15 AM   Specimen: Nasopharyngeal Swab  Result Value Ref Range Status   SARS Coronavirus 2 POSITIVE (A) NEGATIVE Final    Comment: RESULT CALLED TO, READ BACK BY AND VERIFIED WITH: DARLENE LISTOPAD RN.@1800  ON 10.18.2020 BY TCALDWELL MT. (NOTE) SARS-CoV-2 target nucleic acids are DETECTED. The SARS-CoV-2 RNA is generally detectable in upper and lower respiratory specimens during the acute phase of infection. Positive results are indicative of active infection with SARS-CoV-2. Clinical  correlation with patient history and other diagnostic information is necessary to determine patient infection status. Positive results do  not rule out bacterial infection or co-infection with other viruses. The expected result is Negative. Fact Sheet for Patients: SugarRoll.be Fact Sheet for Healthcare Providers: https://www.woods-mathews.com/ This test is not yet approved or cleared by the [place] FDA and  has been authorized for detection and/or diagnosis of SARS-CoV-2 by FDA under an Emergency Use Authorization (EUA). This EUA will remain  in effect (meaning this te st can be used) for the duration of the COVID-19 declaration under Section 564(b)(1) of the Act, 21 U.S.C. section 360bbb-3(b)(1), unless the authorization is terminated or revoked sooner. Performed at [facility], [place] [address], [place], [place] [address]   Culture, blood  (routine x 2)     Status: None   Collection Time: 01/23/19 11:38 AM   Specimen: BLOOD  Result Value Ref Range Status   Specimen Description BLOOD LEFT ANTECUBITAL  Final   Special Requests   Final    BOTTLES DRAWN AEROBIC AND ANAEROBIC Blood Culture adequate volume   Culture   Final    NO GROWTH 5 DAYS Performed at [facility], [address], [place], [place] [address]    Report Status 01/28/2019 FINAL  Final  Culture, blood (routine x 2)  Status: None   Collection Time: 01/23/19 11:38 AM   Specimen: BLOOD  Result Value Ref Range Status   Specimen Description BLOOD RIGHT ANTECUBITAL  Final   Special Requests   Final    BOTTLES DRAWN AEROBIC AND ANAEROBIC Blood Culture adequate volume   Culture   Final    NO GROWTH 5 DAYS Performed at [facility], [address] [place], [place] [address]    Report Status 01/28/2019 FINAL  Final  MRSA PCR Screening     Status: None   Collection Time: 01/23/19  3:54 PM   Specimen: Nasal Mucosa; Nasopharyngeal  Result Value Ref Range Status   MRSA by PCR NEGATIVE NEGATIVE Final    Comment:        The GeneXpert MRSA Assay (FDA approved for NASAL specimens only), is one component of a comprehensive MRSA colonization surveillance program. It is not intended to diagnose MRSA infection nor to guide or monitor treatment for MRSA infections. Performed at [facility], [address] [place], [place] [address]   Culture, Urine     Status: None   Collection Time: 01/25/19  6:30 AM   Specimen: Urine, Random  Result Value Ref Range Status   Specimen Description   Final    URINE, RANDOM Performed at [place] [address], [place], [place] [address]    Special Requests   Final    NONE Performed at [facility], [place] [address], [place], [place] [address]    Culture   Final    NO GROWTH Performed at [facility], [place] [address] [place], [place] [address]     Report Status 01/26/2019 FINAL  Final      Radiology Studies: No results found.  Phillips Climes MD [facility]  Contact via  [email or web address]  [place] P: [phone] F: [phone]

## 2019-02-01 DIAGNOSIS — R627 Adult failure to thrive: Secondary | ICD-10-CM

## 2019-02-01 LAB — GLUCOSE, CAPILLARY
Glucose-Capillary: 116 mg/dL — ABNORMAL HIGH (ref 70–99)
Glucose-Capillary: 140 mg/dL — ABNORMAL HIGH (ref 70–99)
Glucose-Capillary: 215 mg/dL — ABNORMAL HIGH (ref 70–99)

## 2019-02-01 MED ORDER — SODIUM CHLORIDE 0.45 % IV SOLN
INTRAVENOUS | Status: AC
  Administered 2019-02-01: 19:00:00 via INTRAVENOUS

## 2019-02-01 MED ORDER — METFORMIN HCL 500 MG PO TABS
1000.0000 mg | ORAL_TABLET | Freq: Two times a day (BID) | ORAL | Status: DC
  Filled 2019-02-01 (×2): qty 2

## 2019-02-01 NOTE — Plan of Care (Deleted)
  Problem: Respiratory: Goal: Will maintain a patent airway 02/01/2019 1009 by Mariane Baumgarten, RN Outcome: Progressing 02/01/2019 1009 by Mariane Baumgarten, RN Outcome: Not Progressing Goal: Complications related to the disease process, condition or treatment will be avoided or minimized 02/01/2019 1009 by Mariane Baumgarten, RN Outcome: Progressing 02/01/2019 1009 by Mariane Baumgarten, RN Outcome: Not Progressing

## 2019-02-01 NOTE — Progress Notes (Signed)
PROGRESS NOTE  Lisa Hahn DOB: [birth year] DOA: 01/25/2019 PCP: Marco Collie, MD   LOS: 7 days   Brief Narrative / Interim history:  78 year old female with history of hypertension, dementia, prior CVA and poorly verbal at baseline, hypertension, diabetes mellitus, admitted from SNF on 01/23/2021 elements with shortness of breath, dehydration, UTI and was found also to be Covid positive.  Chest x-ray on admission showed multifocal pneumonia.  She was also hyponatremic with a sodium of 160.  She was transferred to [facility] on 10/20  Subjective / 24h Interval events:  Patient demented, cannot provide any reliable complaints, no significant events overnight as discussed with staff  Assessment & Plan: Principal Problem:   Pneumonia due to COVID-19 virus Active Problems:   Type 2 diabetes mellitus (HCC)   Dementia (HCC)   Abdominal pain, chronic, epigastric   UTI (urinary tract infection)   Acute hypoxic respiratory failure due to COVID-19 pneumonia -Remains on 2 L nasal cannula this morning, unable to follow instructions for incentive spirometry, given her advanced dementia .  I was able to wean her to room air today -Continue with steroids -Treated with remdesivir X 5 days -DVT prophylaxis per COVID-19 protocol.  COVID-19 Labs  Recent Labs    01/30/19 0500  DDIMER 2.93*  CRP 2.9*    Lab Results  Component Value Date   SARSCOV2NAA POSITIVE (A) 01/23/2019    Type 2 diabetes mellitus -CBG appear to be mildly elevated, resumed back on Metformin, Januvia remains on hold . - home oral agents, Metformin and Januvia.  CBG (last 3)  Recent Labs    02/01/19 0758 02/01/19 1134 02/01/19 1532  GLUCAP 116* 140* 215*   Nonsustained VT/SVT -Most recent 2D echo showed an EF of 60 to 123456, grade 1 diastolic dysfunction, this was back in 2018.  -Correct hypokalemia . - continue to monitor on telemetry -Started on low-dose metoprolol  Hypokalemia -Repleted  Hypophosphatemia -Repleted  Hypertension -blood pressure is soft , currently off antihypertensive medications  UTI -Urinalysis at [facility] with evidence of infection, treated with Rocephin,  Hypernatremia -Improving with D5W, will decrease rate to 40 cc today  Prior CVA -Resume home aspirin  Transaminitis -due to COVID 19, watch closely especially on remdesivir  Dementia/failure to thrive -Resume Aricept and Namenda. -Unclear if she is on Depakote, but it was on her medication at facility, it wase resumed -Very poor oral intake, refusing oral oral intake, overall extremely weak, bedbound, have discussed with son , he was hoping to take care at home, but apparently he has not seen her for a long time, and not aware of her significant weakness, failure to thrive and severe debility, discussed hospice options, patient is interested on hospice, palliative medicine consulted to discuss goals of care, disposition plan and options with the son.    Scheduled Meds: . aspirin EC  81 mg Oral Daily  . atorvastatin  40 mg Oral Daily  . Chlorhexidine Gluconate Cloth  6 each Topical Daily  . dexamethasone (DECADRON) injection  6 mg Intravenous Q24H  . divalproex  250 mg Oral TID  . enoxaparin (LOVENOX) injection  40 mg Subcutaneous Q24H  . feeding supplement (ENSURE ENLIVE)  237 mL Oral TID BM  . insulin aspart  0-9 Units Subcutaneous TID WC  . memantine  5 mg Oral BID  . metFORMIN  1,000 mg Oral BID WC  . mirtazapine  15 mg Oral QHS  . multivitamin with minerals  1 tablet Oral Daily  . sodium  chloride flush  10-40 mL Intracatheter Q12H  . sodium chloride flush  3 mL Intravenous Q12H  . vitamin C  500 mg Oral Daily  . zinc sulfate  220 mg Oral Daily   Continuous Infusions: . sodium chloride    . sodium chloride     PRN Meds:.sodium chloride, acetaminophen, chlorpheniramine-HYDROcodone, guaiFENesin-dextromethorphan, ondansetron **OR** ondansetron (ZOFRAN) IV, sodium chloride flush,  sodium chloride flush  DVT prophylaxis: Lovenox Code Status: DNR Family Communication: Discussed with son via phone 10/26 Disposition Plan: palliative medicine consulted, placement decision after palliative medicine discussion with the son  Consultants:  None   Procedures:  None   Microbiology  None   Antimicrobials: Ceftriaxone     Objective: Vitals:   02/01/19 1000 02/01/19 1200 02/01/19 1535 02/01/19 1749  BP: (!) 107/54 (!) 97/58 (!) 92/57 106/61  Pulse:  76 68 65  Resp: 14 14 13 12   Temp:  98.8 F (37.1 C) 99.1 F (37.3 C) (!) 97 F (36.1 C)  TempSrc:  Axillary Axillary Oral  SpO2: 97% 95% 93% 95%  Weight:      Height:        Intake/Output Summary (Last 24 hours) at 02/01/2019 1810 Last data filed at 02/01/2019 1759 Gross per 24 hour  Intake 482.99 ml  Output 1025 ml  Net -542.01 ml   Filed Weights   01/25/19 0510  Weight: 59.4 kg    Examination:  Awake,demented, confused, does not follow any commands, frail Symmetrical Chest wall movement, Good air movement bilaterally, CTAB RRR,No Gallops,Rubs or new Murmurs, No Parasternal Heave +ve B.Sounds, Abd Soft, No tenderness, No rebound - guarding or rigidity. No Cyanosis, Clubbing or edema, No new Rash or bruise          Data Reviewed: I have independently reviewed following labs and imaging studies   CBC: Recent Labs  Lab 01/26/19 0230 01/28/19 0440 01/29/19 0515 01/30/19 0500  WBC 7.0 8.6 7.5 7.1  NEUTROABS 5.5 6.0 4.7 4.4  HGB 11.0* 11.4* 10.8* 10.2*  HCT 35.7* 35.9* 33.9* 32.1*  MCV 91.1 88.4 89.2 87.7  PLT 213 227 183 0000000   Basic Metabolic Panel: Recent Labs  Lab 01/26/19 0230 01/28/19 0440 01/28/19 1600 01/29/19 0515 01/30/19 0500  NA 148* 155* 150* 147* 142  K 3.1* 2.7* 4.2 4.0 3.9  CL 115* 122* 118* 114* 109  CO2 23 26 26 24 26   GLUCOSE 261* 177* 332* 255* 251*  BUN 35* 25* 24* 19 18  CREATININE 0.54 0.47 0.45 0.39* 0.35*  CALCIUM 8.3* 8.6* 8.5* 8.2* 8.3*  MG  --   1.8  --   --   --   PHOS  --  2.3*  --   --   --    GFR: Estimated Creatinine Clearance: 52.2 mL/min (A) (by C-G formula based on SCr of 0.35 mg/dL (L)). Liver Function Tests: Recent Labs  Lab 01/26/19 0230 01/28/19 0440 01/29/19 0515 01/30/19 0500  AST 24 105* 72* 48*  ALT 15 72* 72* 60*  ALKPHOS 36* 40 39 44  BILITOT 0.9 1.0 0.7 0.7  PROT 5.8* 5.6* 5.3* 5.2*  ALBUMIN 2.3* 2.2* 2.1* 2.0*   No results for input(s): LIPASE, AMYLASE in the last 168 hours. No results for input(s): AMMONIA in the last 168 hours. Coagulation Profile: No results for input(s): INR, PROTIME in the last 168 hours. Cardiac Enzymes: No results for input(s): CKTOTAL, CKMB, CKMBINDEX, TROPONINI in the last 168 hours. BNP (last 3 results) No results for input(s): PROBNP in the  last 8760 hours. HbA1C: No results for input(s): HGBA1C in the last 72 hours. CBG: Recent Labs  Lab 01/31/19 1556 01/31/19 2057 02/01/19 0758 02/01/19 1134 02/01/19 1532  GLUCAP 270* 248* 116* 140* 215*   Lipid Profile: No results for input(s): CHOL, HDL, LDLCALC, TRIG, CHOLHDL, LDLDIRECT in the last 72 hours. Thyroid Function Tests: No results for input(s): TSH, T4TOTAL, FREET4, T3FREE, THYROIDAB in the last 72 hours. Anemia Panel: No results for input(s): VITAMINB12, FOLATE, FERRITIN, TIBC, IRON, RETICCTPCT in the last 72 hours. Urine analysis:    Component Value Date/Time   COLORURINE AMBER (A) 01/23/2019 1031   APPEARANCEUR CLOUDY (A) 01/23/2019 1031   APPEARANCEUR Clear 02/25/2014 1352   LABSPEC 1.021 01/23/2019 1031   LABSPEC 1.015 02/25/2014 1352   PHURINE 5.0 01/23/2019 1031   GLUCOSEU NEGATIVE 01/23/2019 1031   GLUCOSEU Negative 02/25/2014 1352   HGBUR SMALL (A) 01/23/2019 1031   BILIRUBINUR NEGATIVE 01/23/2019 1031   BILIRUBINUR negative 08/22/2016 0900   BILIRUBINUR Negative 02/25/2014 1352   KETONESUR NEGATIVE 01/23/2019 1031   PROTEINUR 100 (A) 01/23/2019 1031   UROBILINOGEN 0.2 08/22/2016 0900    UROBILINOGEN 1.0 07/15/2010 0827   NITRITE POSITIVE (A) 01/23/2019 1031   LEUKOCYTESUR MODERATE (A) 01/23/2019 1031   LEUKOCYTESUR 1+ 02/25/2014 1352   Sepsis Labs: Invalid input(s): PROCALCITONIN, LACTICIDVEN  Recent Results (from the past 240 hour(s))  SARS CORONAVIRUS 2 (TAT 6-24 HRS) Nasopharyngeal Nasopharyngeal Swab     Status: Abnormal   Collection Time: 01/23/19  9:15 AM   Specimen: Nasopharyngeal Swab  Result Value Ref Range Status   SARS Coronavirus 2 POSITIVE (A) NEGATIVE Final    Comment: RESULT CALLED TO, READ BACK BY AND VERIFIED WITH: DARLENE LISTOPAD RN.@1800  ON 10.18.2020 BY TCALDWELL MT. (NOTE) SARS-CoV-2 target nucleic acids are DETECTED. The SARS-CoV-2 RNA is generally detectable in upper and lower respiratory specimens during the acute phase of infection. Positive results are indicative of active infection with SARS-CoV-2. Clinical  correlation with patient history and other diagnostic information is necessary to determine patient infection status. Positive results do  not rule out bacterial infection or co-infection with other viruses. The expected result is Negative. Fact Sheet for Patients: SugarRoll.be Fact Sheet for Healthcare Providers: https://www.woods-mathews.com/ This test is not yet approved or cleared by the [place] FDA and  has been authorized for detection and/or diagnosis of SARS-CoV-2 by FDA under an Emergency Use Authorization (EUA). This EUA will remain  in effect (meaning this te st can be used) for the duration of the COVID-19 declaration under Section 564(b)(1) of the Act, 21 U.S.C. section 360bbb-3(b)(1), unless the authorization is terminated or revoked sooner. Performed at [facility], [place] [address] [place] [address], [place], [place] [address]   Culture, blood (routine x 2)     Status: None   Collection Time: 01/23/19 11:38 AM   Specimen: BLOOD  Result Value Ref Range Status   Specimen  Description BLOOD LEFT ANTECUBITAL  Final   Special Requests   Final    BOTTLES DRAWN AEROBIC AND ANAEROBIC Blood Culture adequate volume   Culture   Final    NO GROWTH 5 DAYS Performed at [facility], [address], [place], [place] [address]    Report Status 01/28/2019 FINAL  Final  Culture, blood (routine x 2)     Status: None   Collection Time: 01/23/19 11:38 AM   Specimen: BLOOD  Result Value Ref Range Status   Specimen Description BLOOD RIGHT ANTECUBITAL  Final   Special Requests  Final    BOTTLES DRAWN AEROBIC AND ANAEROBIC Blood Culture adequate volume   Culture   Final    NO GROWTH 5 DAYS Performed at [facility], [address] [place], [place] [address]    Report Status 01/28/2019 FINAL  Final  MRSA PCR Screening     Status: None   Collection Time: 01/23/19  3:54 PM   Specimen: Nasal Mucosa; Nasopharyngeal  Result Value Ref Range Status   MRSA by PCR NEGATIVE NEGATIVE Final    Comment:        The GeneXpert MRSA Assay (FDA approved for NASAL specimens only), is one component of a comprehensive MRSA colonization surveillance program. It is not intended to diagnose MRSA infection nor to guide or monitor treatment for MRSA infections. Performed at [facility], [place] [place], [place] [address]   Culture, Urine     Status: None   Collection Time: 01/25/19  6:30 AM   Specimen: Urine, Random  Result Value Ref Range Status   Specimen Description   Final    URINE, RANDOM Performed at [place] [address], [place], [place] [address]    Special Requests   Final    NONE Performed at [facility], [place] [address], [place], [place] [address]    Culture   Final    NO GROWTH Performed at [facility], [place] [address], [place], [place] [address]    Report Status 01/26/2019 FINAL  Final      Radiology Studies: No results found.  Phillips Climes MD [facility]   Contact via  [email or web address]  [place] P: [phone] F: [phone]

## 2019-02-01 NOTE — Progress Notes (Signed)
Initial Nutrition Assessment RD working remotely.  DOCUMENTATION CODES:   Not applicable  INTERVENTION:    Continue Regular diet as tolerated.  D/C Ensure Enlive, patient is refusing.  D/C MVI daily, patient refusing.  Continue Hormel Shake daily with Breakfast which provides 520 kcals and 22 g of protein and Magic cup BID with lunch and dinner, each supplement provides 290 kcal and 9 grams of protein, automatically on meal trays to optimize nutritional intake.   NUTRITION DIAGNOSIS:   Increased nutrient needs related to wound healing, acute illness(COVID) as evidenced by estimated needs.  Ongoing   GOAL:   Patient will meet greater than or equal to 90% of their needs  Unmet  MONITOR:   PO intake, Supplement acceptance, Labs  ASSESSMENT:   78 yo female admitted with SOB, dehydration, UTI, COVID positive. PMH includes HTN, dementia, CVA, DM, minimally verbal at baseline.   Goals of care being discussed with patient's son. She may d/c with Hospice care.  Meal completion 0-15%, refusing Ensure supplements and MVI as well as other PO medications.  Labs reviewed. CBG's: 116-140  Medications reviewed and include decadron, novolog, vitamin C, zinc sulfate.   NUTRITION - FOCUSED PHYSICAL EXAM:  deferred  Diet Order:   Diet Order            Diet Heart Room service appropriate? Yes; Fluid consistency: Thin  Diet effective now              EDUCATION NEEDS:   Not appropriate for education at this time  Skin:  Skin Assessment: Skin Integrity Issues: Skin Integrity Issues:: Stage II Stage II: buttocks  Last BM:  10/25  Height:   Ht Readings from Last 1 Encounters:  01/30/19 5\' 5"  (1.651 m)    Weight:   Wt Readings from Last 1 Encounters:  01/25/19 59.4 kg    Ideal Body Weight:  56.8 kg  BMI:  Body mass index is 21.79 kg/m.  Estimated Nutritional Needs:   Kcal:  1600-1800  Protein:  80-90 gm  Fluid:  >/= 1.7 L    Molli Barrows, RD,  LDN, [place] Pager [phone] After Hours Pager [phone]

## 2019-02-02 ENCOUNTER — Encounter (HOSPITAL_COMMUNITY): Payer: Self-pay | Admitting: Primary Care

## 2019-02-02 DIAGNOSIS — Z7189 Other specified counseling: Secondary | ICD-10-CM

## 2019-02-02 DIAGNOSIS — Z515 Encounter for palliative care: Secondary | ICD-10-CM

## 2019-02-02 DIAGNOSIS — U071 COVID-19: Principal | ICD-10-CM

## 2019-02-02 DIAGNOSIS — J1289 Other viral pneumonia: Secondary | ICD-10-CM

## 2019-02-02 DIAGNOSIS — F0390 Unspecified dementia without behavioral disturbance: Secondary | ICD-10-CM

## 2019-02-02 LAB — CBC
HCT: 33.8 % — ABNORMAL LOW (ref 36.0–46.0)
Hemoglobin: 10.7 g/dL — ABNORMAL LOW (ref 12.0–15.0)
MCH: 27.9 pg (ref 26.0–34.0)
MCHC: 31.7 g/dL (ref 30.0–36.0)
MCV: 88.3 fL (ref 80.0–100.0)
Platelets: 220 10*3/uL (ref 150–400)
RBC: 3.83 MIL/uL — ABNORMAL LOW (ref 3.87–5.11)
RDW: 16.8 % — ABNORMAL HIGH (ref 11.5–15.5)
WBC: 7.4 10*3/uL (ref 4.0–10.5)
nRBC: 0.3 % — ABNORMAL HIGH (ref 0.0–0.2)

## 2019-02-02 LAB — COMPREHENSIVE METABOLIC PANEL
ALT: 36 U/L (ref 0–44)
AST: 26 U/L (ref 15–41)
Albumin: 2 g/dL — ABNORMAL LOW (ref 3.5–5.0)
Alkaline Phosphatase: 42 U/L (ref 38–126)
Anion gap: 7 (ref 5–15)
BUN: 20 mg/dL (ref 8–23)
CO2: 28 mmol/L (ref 22–32)
Calcium: 8.1 mg/dL — ABNORMAL LOW (ref 8.9–10.3)
Chloride: 108 mmol/L (ref 98–111)
Creatinine, Ser: 0.42 mg/dL — ABNORMAL LOW (ref 0.44–1.00)
GFR calc Af Amer: >60 mL/min (ref >60)
GFR calc non Af Amer: >60 mL/min (ref >60)
Glucose, Bld: 129 mg/dL — ABNORMAL HIGH (ref 70–99)
Potassium: 3.4 mmol/L — ABNORMAL LOW (ref 3.5–5.1)
Sodium: 143 mmol/L (ref 135–145)
Total Bilirubin: 0.3 mg/dL (ref 0.3–1.2)
Total Protein: 5.2 g/dL — ABNORMAL LOW (ref 6.5–8.1)

## 2019-02-02 LAB — GLUCOSE, CAPILLARY
Glucose-Capillary: 179 mg/dL — ABNORMAL HIGH (ref 70–99)
Glucose-Capillary: 113 mg/dL — ABNORMAL HIGH (ref 70–99)
Glucose-Capillary: 122 mg/dL — ABNORMAL HIGH (ref 70–99)

## 2019-02-02 MED ORDER — MORPHINE SULFATE (CONCENTRATE) 10 MG/0.5ML PO SOLN: 5.0000 mg | ORAL | Status: AC | PRN

## 2019-02-02 MED ORDER — LORAZEPAM 2 MG/ML PO CONC: 0.5000 mg | Freq: Four times a day (QID) | ORAL | Status: DC | PRN

## 2019-02-02 MED ORDER — MORPHINE SULFATE (CONCENTRATE) 10 MG/0.5ML PO SOLN: 5.0000 mg | ORAL | Status: DC | PRN

## 2019-02-02 MED ORDER — LORAZEPAM 2 MG/ML PO CONC: 0.5000 mg | Freq: Four times a day (QID) | ORAL | 0 refills | Status: AC | PRN

## 2019-02-02 NOTE — Progress Notes (Signed)
Called [facility] Ferol Luz, [place] to give report. Report given to at [facility].

## 2019-02-02 NOTE — Progress Notes (Signed)
[facility] [place] son Redmond Pulling called me back. Discussed hospice services and the hospice home service agreement with him. He is in agreement with comfort care and does accept the bed offer.   He will sign paperwork at our facility at 400pm. I have spoke to Juda and she will arrange ambulance for 430pm.   Webb Silversmith RN [phone]

## 2019-02-02 NOTE — Consult Note (Signed)
Consultation Note Date: 02/02/2019   Patient Name: Lisa Hahn  DOB: Nov 07, 1940  MRN: [id number]  Age / Sex: 78 y.o., female  PCP: Marco Collie, MD Referring Physician: Jonetta Osgood, MD  Reason for Consultation: Establishing goals of care, Hospice Evaluation and Psychosocial/spiritual support  HPI/Patient Profile: 78 y.o. female  with past medical history of dementia, arthritis, diabetes, hypertension, previous stroke, resident of [facility] admitted on 01/25/2019 with pneumonia due to COVID-19.   Clinical Assessment and Goals of Care: Assessment and consultation done via phone due to Covid restrictions.  Call to son, Vista Leazer at [phone].  We talk in detail about Mrs. Meyerson acute and chronic health concerns.  Linton Rump shares that he is an only child.  Mrs. Paoletti husband died about 8 years ago, she had a minor stroke, and began showing signs of memory loss.  We talked about Mrs. Falkner continued declines, not eating and drinking.  Linton Rump shares that he has not been able to visit his mother due to Covid restrictions and he feels that she has been "going down since he cannot see her".   Linton Rump tells me that he has been working toward securing hospice care for his mother.   We talked about hospice services at home and in a facility.  Linton Rump tells me that he understands his mother is bedbound at this point.  I share that it takes a team to care for her here in the hospital.  We talked about "being the son" at residential hospice versus being the caregiver at home. Prognosis discussed with permission.    We talk in detail about what is and is not provided at residential hospice.  I share that food and drink will be offered, but no IV fluids.  We talked about God's perfect plan for the body as we near end-of-life.  We talk about   Linton Rump tells me, "I want her to be comfortable".  He  shares that she brought her husband, his father, home for hospice care, but "she was a Marine scientist".  Linton Rump tells me that his preference is residential hospice.   Linton Rump asks about the cost of residential hospice.  I encouraged him to discuss that with hospice provider. Linton Rump tells me that he can drive as needed to see Mrs. Tremble.  Linton Rump is on FMLA now  [employer] does allow visitors. If tested positive patient will need 2 negative tests 24 hours apart for admittance.  [facility] allows visitors and does not need further testing.   Conference with attending, bedside nursing staff, and SW related to patient condition, needs, disposition.    HCPOA   NEXT OF KIN -son, Marte Becknell. No other children.  Spouse gone about 8 years.     SUMMARY OF RECOMMENDATIONS   Comfort and dignity at end-of-life, residential hospice. First choice [facility], second choice [facility]   Code Status/Advance Care Planning:  DNR  Symptom Management:   Per hospitalist, no additional needs at this time.  Palliative Prophylaxis:   Oral  Care and Turn Reposition  Additional Recommendations (Limitations, Scope, Preferences):  Full Comfort Care  Psycho-social/Spiritual:   Desire for further Chaplaincy support:no  Additional Recommendations: Caregiving  Support/Resources and Education on Hospice  Prognosis:   < 2 weeks, based on frailty, poor functional status, or,  Discharge Planning: Hospice facility      Primary Diagnoses: Present on Admission: . Pneumonia due to COVID-19 virus . Abdominal pain, chronic, epigastric . Dementia ([place]) . UTI (urinary tract infection)   I have reviewed the medical record, interviewed the patient and family, and examined the patient. The following aspects are pertinent.  Past Medical History:  Diagnosis Date  . Arthritis   . Dementia ([place])   . Diabetes mellitus without complication ([place])   . Hypertension   . Stroke [facility])     Social History   Socioeconomic History  . Marital status: Single    Spouse name: Not on file  . Number of children: Not on file  . Years of education: Not on file  . Highest education level: Not on file  Occupational History  . Not on file  Social Needs  . Financial resource strain: Not on file  . Food insecurity    Worry: Not on file    Inability: Not on file  . Transportation needs    Medical: Not on file    Non-medical: Not on file  Tobacco Use  . Smoking status: Former Research scientist (life sciences)  . Smokeless tobacco: Never Used  Substance and Sexual Activity  . Alcohol use: No  . Drug use: No  . Sexual activity: Not on file  Lifestyle  . Physical activity    Days per week: Not on file    Minutes per session: Not on file  . Stress: Not on file  Relationships  . Social Herbalist on phone: Not on file    Gets together: Not on file    Attends religious service: Not on file    Active member of club or organization: Not on file    Attends meetings of clubs or organizations: Not on file    Relationship status: Not on file  Other Topics Concern  . Not on file  Social History Narrative  . Not on file   Family History  Problem Relation Age of Onset  . Arthritis Other        Parent   Scheduled Meds: . aspirin EC  81 mg Oral Daily  . atorvastatin  40 mg Oral Daily  . Chlorhexidine Gluconate Cloth  6 each Topical Daily  . dexamethasone (DECADRON) injection  6 mg Intravenous Q24H  . divalproex  250 mg Oral TID  . enoxaparin (LOVENOX) injection  40 mg Subcutaneous Q24H  . feeding supplement (ENSURE ENLIVE)  237 mL Oral TID BM  . insulin aspart  0-9 Units Subcutaneous TID WC  . memantine  5 mg Oral BID  . metFORMIN  1,000 mg Oral BID WC  . mirtazapine  15 mg Oral QHS  . multivitamin with minerals  1 tablet Oral Daily  . sodium chloride flush  10-40 mL Intracatheter Q12H  . sodium chloride flush  3 mL Intravenous Q12H  . vitamin C  500 mg Oral Daily  . zinc sulfate  220 mg  Oral Daily   Continuous Infusions: . sodium chloride     PRN Meds:.sodium chloride, acetaminophen, chlorpheniramine-HYDROcodone, guaiFENesin-dextromethorphan, ondansetron **OR** ondansetron (ZOFRAN) IV, sodium chloride flush, sodium chloride flush Medications Prior to Admission:  Prior to Admission medications  Medication Sig Start Date End Date Taking? Authorizing Provider  acetaminophen (TYLENOL) 325 MG tablet Take 650 mg by mouth every 6 (six) hours as needed.   Yes [provider]  aspirin EC 81 MG tablet Take 81 mg by mouth daily.   Yes [provider]  atorvastatin (LIPITOR) 40 MG tablet Take 40 mg by mouth daily.   Yes [provider]  cholecalciferol (VITAMIN D3) 25 MCG (1000 UT) tablet Take 1,000 Units by mouth daily.   Yes [provider]  divalproex (DEPAKOTE SPRINKLE) 125 MG capsule Take 250 mg by mouth 3 (three) times daily.   Yes [provider]  donepezil (ARICEPT) 5 MG tablet Take 5 mg by mouth daily.    Yes [provider]  guaiFENesin (ROBITUSSIN) 100 MG/5ML liquid Take 200 mg by mouth 4 (four) times daily as needed for cough.    Yes [provider]  losartan (COZAAR) 50 MG tablet Take 50 mg by mouth daily.   Yes [provider]  Melatonin 10 MG TABS Take 10 mg by mouth at bedtime.   Yes [provider]  memantine (NAMENDA) 5 MG tablet Take 5 mg by mouth 2 (two) times daily.   Yes [provider]  metFORMIN (GLUCOPHAGE) 1000 MG tablet Take 1,000 mg by mouth 2 (two) times daily with a meal.   Yes [provider]  potassium chloride (KLOR-CON) 10 MEQ tablet Take 10 mEq by mouth daily.   Yes [provider]  promethazine 25 mg in sodium chloride 0.9 % 1,000 mL Inject 25 mg into the vein every 6 (six) hours as needed (nausea/vomiting).   Yes [provider]  sitaGLIPtin (JANUVIA) 100 MG tablet Take 100 mg by mouth daily.   Yes [provider]  traZODone  (DESYREL) 50 MG tablet Take 50 mg by mouth at bedtime.   Yes [provider]  ceFEPIme 2 g in sodium chloride 0.9 % 100 mL Inject 2 g into the vein every 12 (twelve) hours. 01/24/19   Henreitta Leber, MD  dexamethasone (DECADRON) 6 MG tablet Take 1 tablet (6 mg total) by mouth daily. 01/25/19   Henreitta Leber, MD  enoxaparin (LOVENOX) 40 MG/0.4ML injection Inject 0.4 mLs (40 mg total) into the skin daily. 01/24/19   Henreitta Leber, MD  Potassium Chloride in Dextrose (DEXTROSE 5 % WITH KCL 20 MEQ / L) 20-5 MEQ/L-% Inject 1,000 mLs (20 mEq total) into the vein continuous. 01/24/19   Henreitta Leber, MD  traMADol (ULTRAM) 50 MG tablet Take 1 tablet (50 mg total) by mouth every 6 (six) hours as needed. 01/24/19   Henreitta Leber, MD   Allergies  Allergen Reactions  . Aspirin Nausea And Vomiting    Can tolerate baby aspirin  . Motrin [Ibuprofen] Other (See Comments)    Reaction: Burns stomach   Review of Systems  Unable to perform ROS: Age    Physical Exam Vitals signs and nursing note reviewed.  Constitutional:      Comments: Conference with bedside nursing staff related to patient condition, needs.     Vital Signs: BP 110/74 (BP Location: Left Arm)   Pulse 82   Temp 97.6 F (36.4 C) (Axillary)   Resp 16   Ht 5\' 5"  (1.651 m)   Wt 59.4 kg   SpO2 96%   BMI 21.79 kg/m  Pain Scale: PAINAD   Pain Score: 0-No pain   SpO2: SpO2: 96 % O2 Device:SpO2: 96 % O2 Flow  Rate: .O2 Flow Rate (L/min): 0.5 L/min  IO: Intake/output summary:   Intake/Output Summary (Last 24 hours) at 02/02/2019 0901 Last data filed at 02/02/2019 0600 Gross per 24 hour  Intake 565.92 ml  Output 450 ml  Net 115.92 ml    LBM: Last BM Date: 02/02/19 Baseline Weight: Weight: 59.4 kg Most recent weight: Weight: 59.4 kg     Palliative Assessment/Data:   Flowsheet Rows     Most Recent Value  Intake Tab  Referral Department  Hospitalist  Unit at Time of Referral  Med/Surg Unit   Palliative Care Primary Diagnosis  Pulmonary  Date Notified  01/30/19  Palliative Care Type  New Palliative care  Reason for referral  Clarify Goals of Care, Counsel Regarding Hospice  Date of Admission  01/25/19  Date first seen by Palliative Care  02/02/19  # of days Palliative referral response time  3 Day(s)  # of days IP prior to Palliative referral  5  Clinical Assessment  Palliative Performance Scale Score  20%  Pain Max last 24 hours  Not able to report  Pain Min Last 24 hours  Not able to report  Dyspnea Max Last 24 Hours  Not able to report  Dyspnea Min Last 24 hours  Not able to report  Psychosocial & Spiritual Assessment  Palliative Care Outcomes      Time In: 0850 Time Out: 1010 Time Total: 80 minutes  Greater than 50%  of this time was spent counseling and coordinating care related to the above assessment and plan.  Signed by: Drue Novel, NP   Please contact Palliative Medicine Team phone at [phone] for questions and concerns.  For individual provider: See Shea Evans

## 2019-02-02 NOTE — TOC Transition Note (Signed)
Transition of Care [facility]) - CM/SW Discharge Note   Patient Details  Name: Lisa Hahn MRN: [id number] Date of Birth: [birth year]  [facility] CM/SW Contact:  Weston Anna, LCSW Phone Number: 02/02/2019, 3:12 PM   Clinical Narrative:     Patient set to discharge to [facility] today- Please call report to [phone]. PTAR scheduled for 4:30PM after son signs all needed paperwork at facility. No other needs at this time- DNR requested from MD (should be with charge RN).   Final next level of care: [place] Barriers to Discharge: No Barriers Identified   Patient Goals and CMS Choice Patient states their goals for this hospitalization and ongoing recovery are:: being comfortable CMS Medicare.gov Compare Post Acute Care list provided to:: Patient Represenative (must comment)(POA) Choice offered to / list presented to : [facility] POA / Guardian  Discharge Placement                Patient to be transferred to facility by: [place] Name of family member notified: son- maurice Patient and family notified of of transfer: 02/02/19  Discharge Plan and Services In-house Referral: NA   Post Acute Care Choice: Home Health          DME Arranged: N/A         HH Arranged: NA          Social Determinants of Health (SDOH) Interventions     Readmission Risk Interventions Readmission Risk Prevention Plan 01/29/2019  Transportation Screening Complete  PCP or Specialist Appt within 3-5 Days Complete  HRI or Home Care Consult Complete  Social Work Consult for [place] Planning/Counseling Complete  Palliative Care Screening Complete  Medication Review Press photographer) Complete  Some recent data might be hidden
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
## 2019-02-02 NOTE — Progress Notes (Signed)
[facility]:  Joya Salm  Referral received for COVID unit at [facility]. I have reached out to discuss with pt's son Linton Rump and have left voicemail to return call to discuss discharge plan.   Jannette Fogo RN [phone]

## 2019-02-02 NOTE — Progress Notes (Signed)
Pt left via stretcher by [facility] for discharge to [facility].  No s/s of distress at this time.

## 2019-02-02 NOTE — Progress Notes (Signed)
Physical Therapy Treatment Patient Details Name: Lisa Hahn MRN: [id number] DOB: 08/04/1940 Today's Date: 02/02/2019    History of Present Illness 78 year old female with history of hypertension, dementia, prior CVA and poorly verbal at baseline, hypertension, diabetes mellitus, admitted from [facility] on [facility]/2021 elements with shortness of breath, dehydration, UTI and was found also to be Covid positive.  Chest x-ray on admission showed multifocal pneumonia.  She was also hyponatremic with a sodium of 160.  She was transferred to [facility] on 10/20    PT Comments    Pt still needing max -total a with all functional mobility. Pt was arousal and with max-total a able to sit EOB x 10 mins with therapist. Pt was able to, on multi occasions, pick her head up and attempt to look out window at rain. minimal head control and also trunk control briefly, otherwise pt is max to total a.    Follow Up Recommendations  Home health PT     Equipment Recommendations  Hospital bed;Other (comment)    Recommendations for Other Services Other (comment)     Precautions / Restrictions Precautions Precautions: Fall Precaution Comments: cognition Restrictions Weight Bearing Restrictions: No    Mobility  Bed Mobility Overal bed mobility: Needs Assistance Bed Mobility: Rolling;Supine to Sit;Sit to Supine Rolling: Max assist   Supine to sit: Max assist;Total assist Sit to supine: Total assist;Max assist      Transfers                 General transfer comment: did not attempt sit<>stand today, pt needing total a to stand  Ambulation/Gait             General Gait Details: pt is non ambulatory   Stairs             Wheelchair Mobility    Modified Rankin (Stroke Patients Only)       Balance Overall balance assessment: Needs assistance Sitting-balance support: Feet supported;No upper extremity supported Sitting balance-Leahy Scale: Poor Sitting balance - Comments:  sits EOB approx 10 mins today, slightly more engaged and able to attempted to sit up and look outside window but trunk control is poor and activity tolerance is also poor. Postural control: Posterior lean;Right lateral lean   Standing balance-Leahy Scale: Zero                              Cognition Arousal/Alertness: Lethargic Behavior During Therapy: Flat affect Overall Cognitive Status: History of cognitive impairments - at baseline                                        Exercises      General Comments General comments (skin integrity, edema, etc.): Pt on room air duirng tx and sats remaine in 90s, once noted sats 89% but was able tto recover quickly.      Pertinent Vitals/Pain Pain Assessment: Faces Faces Pain Scale: Hurts a little bit Pain Location: with any movement Pain Descriptors / Indicators: Grimacing;Moaning;Nagging Pain Intervention(s): Limited activity within patient's tolerance;Monitored during session    Home Living                      Prior Function            PT Goals (current goals can now be found in the care plan section) Acute  Rehab PT Goals Patient Stated Goal: unable Time For Goal Achievement: 02/11/19 Potential to Achieve Goals: Fair Progress towards PT goals: Not progressing toward goals - comment    Frequency    Min 2X/week      PT Plan Discharge plan needs to be updated    Co-evaluation              AM-PAC PT "6 Clicks" Mobility   Outcome Measure  Help needed turning from your back to your side while in a flat bed without using bedrails?: Total Help needed moving from lying on your back to sitting on the side of a flat bed without using bedrails?: Total Help needed moving to and from a bed to a chair (including a wheelchair)?: Total   Help needed to walk in hospital room?: Total Help needed climbing 3-5 steps with a railing? : Total 6 Click Score: 5    End of Session   Activity  Tolerance: Treatment limited secondary to medical complications (Comment) Patient left: in bed;with call bell/phone within reach Nurse Communication: Mobility status PT Visit Diagnosis: Other abnormalities of gait and mobility (R26.89);Muscle weakness (generalized) (M62.81)     Time: [id number] PT Time Calculation (min) (ACUTE ONLY): 16 min  Charges:  $Therapeutic Activity: 8-22 mins                     Horald Chestnut, PT    Delford Field 02/02/2019, 1:50 PM

## 2019-02-03 LAB — GLUCOSE, CAPILLARY: Glucose-Capillary: 119 mg/dL — ABNORMAL HIGH (ref 70–99)

## 2019-03-08 DEATH — deceased

## 2021-04-15 IMAGING — DX DG CHEST 1V PORT
1 series · 1 of 1 positions shown · non-contrast
Comparison: 01/27/2017

CLINICAL DATA: Shortness of breath

EXAM:
PORTABLE CHEST 1 VIEW

[chest ap]
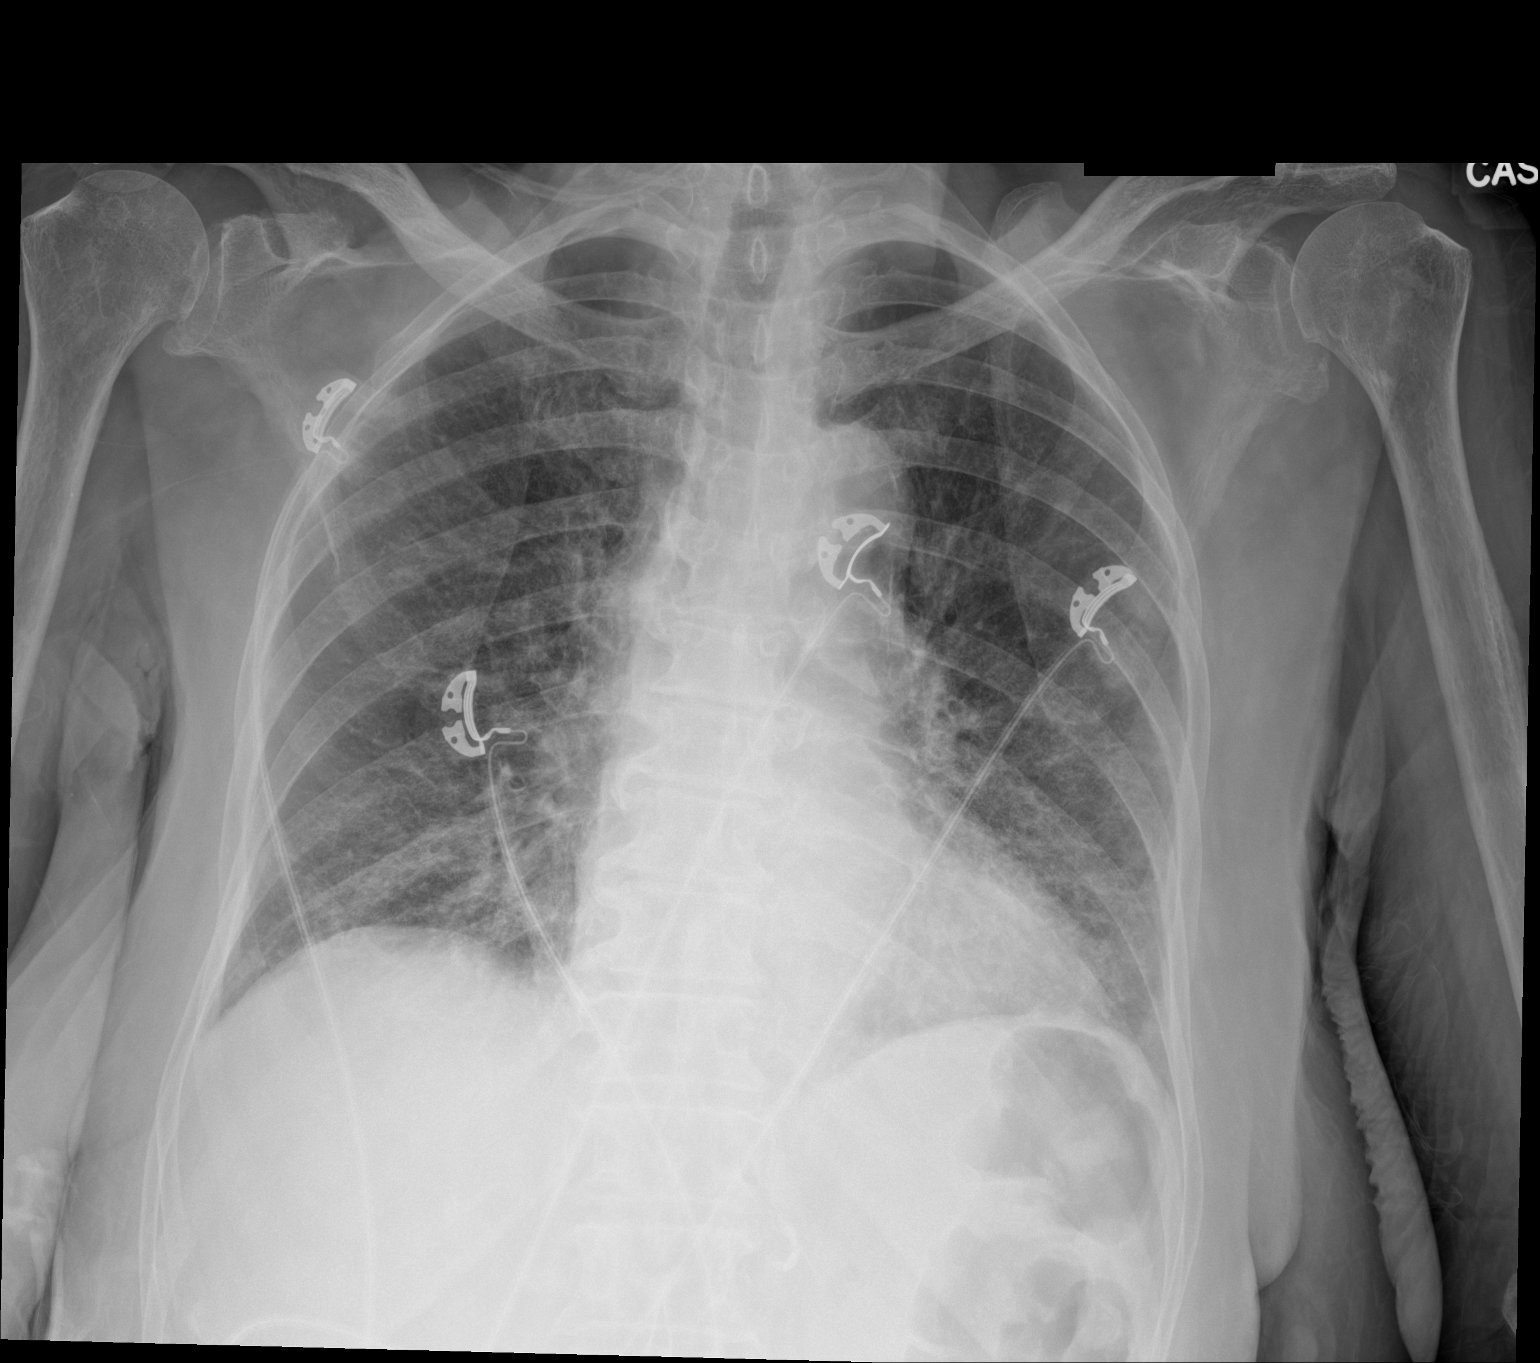

[1 of 1 positions shown; findings below may reference images not displayed]

FINDINGS: Mild patchy opacity in the lingula and bilateral lower lobes. No
pleural effusion or pneumothorax.

The heart is normal in size.
IMPRESSION: Mild patchy opacity in the lingula and bilateral lower lobes,
worrisome for multifocal pneumonia.
